# Patient Record
Sex: Female | Born: 1955
Health system: Southern US, Community
[De-identification: ages and names within clinical notes are randomized; demographics above are authoritative.]

## PROBLEM LIST (undated history)

## (undated) DIAGNOSIS — R911 Solitary pulmonary nodule: Secondary | ICD-10-CM

## (undated) DIAGNOSIS — Z860100 Personal history of colon polyps, unspecified: Secondary | ICD-10-CM

## (undated) DIAGNOSIS — E669 Obesity, unspecified: Secondary | ICD-10-CM

## (undated) DIAGNOSIS — G4700 Insomnia, unspecified: Secondary | ICD-10-CM

## (undated) DIAGNOSIS — J189 Pneumonia, unspecified organism: Secondary | ICD-10-CM

## (undated) DIAGNOSIS — J019 Acute sinusitis, unspecified: Secondary | ICD-10-CM

## (undated) DIAGNOSIS — I10 Essential (primary) hypertension: Secondary | ICD-10-CM

## (undated) DIAGNOSIS — M1990 Unspecified osteoarthritis, unspecified site: Secondary | ICD-10-CM

## (undated) DIAGNOSIS — K7460 Unspecified cirrhosis of liver: Secondary | ICD-10-CM

## (undated) DIAGNOSIS — E559 Vitamin D deficiency, unspecified: Secondary | ICD-10-CM

## (undated) DIAGNOSIS — E1169 Type 2 diabetes mellitus with other specified complication: Secondary | ICD-10-CM

## (undated) DIAGNOSIS — B001 Herpesviral vesicular dermatitis: Secondary | ICD-10-CM

## (undated) DIAGNOSIS — N760 Acute vaginitis: Secondary | ICD-10-CM

## (undated) DIAGNOSIS — Z8709 Personal history of other diseases of the respiratory system: Secondary | ICD-10-CM

## (undated) DIAGNOSIS — R945 Abnormal results of liver function studies: Secondary | ICD-10-CM

## (undated) HISTORY — DX: Obesity, unspecified: E66.9

## (undated) HISTORY — PX: KNEE ARTHROSCOPY: SHX127

## (undated) HISTORY — DX: Type 2 diabetes mellitus with other specified complication: E11.69

## (undated) HISTORY — PX: BREAST BIOPSY: SHX20

## (undated) HISTORY — DX: Abnormal results of liver function studies: R94.5

## (undated) HISTORY — PX: TONSILLECTOMY: SUR1361

## (undated) HISTORY — DX: Unspecified cirrhosis of liver: K74.60

## (undated) HISTORY — DX: Unspecified osteoarthritis, unspecified site: M19.90

## (undated) HISTORY — DX: Acute sinusitis, unspecified: J01.90

## (undated) HISTORY — DX: Insomnia, unspecified: G47.00

## (undated) HISTORY — DX: Acute vaginitis: N76.0

## (undated) HISTORY — PX: TUBAL LIGATION: SHX77

## (undated) HISTORY — DX: Herpesviral vesicular dermatitis: B00.1

## (undated) HISTORY — DX: Solitary pulmonary nodule: R91.1

## (undated) HISTORY — PX: COLONOSCOPY WITH ESOPHAGOGASTRODUODENOSCOPY (EGD): SHX5779

## (undated) HISTORY — DX: Vitamin D deficiency, unspecified: E55.9

---

## 1994-01-29 HISTORY — PX: ABDOMINAL HYSTERECTOMY: SHX81

## 1997-06-17 ENCOUNTER — Ambulatory Visit (HOSPITAL_BASED_OUTPATIENT_CLINIC_OR_DEPARTMENT_OTHER): Admission: RE | Admit: 1997-06-17 | Discharge: 1997-06-17 | Payer: Self-pay | Admitting: Orthopedic Surgery

## 2005-12-05 ENCOUNTER — Ambulatory Visit (HOSPITAL_BASED_OUTPATIENT_CLINIC_OR_DEPARTMENT_OTHER): Admission: RE | Admit: 2005-12-05 | Discharge: 2005-12-05 | Payer: Self-pay | Admitting: Orthopedic Surgery

## 2010-01-29 LAB — HM PAP SMEAR: HM Pap smear: NORMAL

## 2010-06-14 ENCOUNTER — Emergency Department (HOSPITAL_BASED_OUTPATIENT_CLINIC_OR_DEPARTMENT_OTHER)
Admission: EM | Admit: 2010-06-14 | Discharge: 2010-06-14 | Disposition: A | Discharge status: Home / Self Care | Payer: Self-pay | Attending: Emergency Medicine | Admitting: Emergency Medicine

## 2010-06-14 DIAGNOSIS — R112 Nausea with vomiting, unspecified: Secondary | ICD-10-CM | POA: Insufficient documentation

## 2010-06-14 DIAGNOSIS — I10 Essential (primary) hypertension: Secondary | ICD-10-CM | POA: Insufficient documentation

## 2010-06-14 LAB — BASIC METABOLIC PANEL
BUN: 14 mg/dL (ref 6–23)
CO2: 25 mEq/L (ref 19–32)
Chloride: 103 mEq/L (ref 96–112)
Glucose, Bld: 105 mg/dL — ABNORMAL HIGH (ref 70–99)
Potassium: 3.9 mEq/L (ref 3.5–5.1)

## 2012-01-28 ENCOUNTER — Emergency Department (HOSPITAL_BASED_OUTPATIENT_CLINIC_OR_DEPARTMENT_OTHER)
Admission: EM | Admit: 2012-01-28 | Discharge: 2012-01-28 | Disposition: A | Discharge status: Home / Self Care | Payer: No Typology Code available for payment source | Attending: Emergency Medicine | Admitting: Emergency Medicine

## 2012-01-28 ENCOUNTER — Encounter (HOSPITAL_BASED_OUTPATIENT_CLINIC_OR_DEPARTMENT_OTHER): Payer: Self-pay | Admitting: *Deleted

## 2012-01-28 DIAGNOSIS — R05 Cough: Secondary | ICD-10-CM | POA: Insufficient documentation

## 2012-01-28 DIAGNOSIS — I10 Essential (primary) hypertension: Secondary | ICD-10-CM | POA: Insufficient documentation

## 2012-01-28 DIAGNOSIS — R059 Cough, unspecified: Secondary | ICD-10-CM | POA: Insufficient documentation

## 2012-01-28 DIAGNOSIS — H6691 Otitis media, unspecified, right ear: Secondary | ICD-10-CM

## 2012-01-28 DIAGNOSIS — Z90710 Acquired absence of both cervix and uterus: Secondary | ICD-10-CM | POA: Insufficient documentation

## 2012-01-28 DIAGNOSIS — H6690 Otitis media, unspecified, unspecified ear: Secondary | ICD-10-CM | POA: Insufficient documentation

## 2012-01-28 DIAGNOSIS — Z79899 Other long term (current) drug therapy: Secondary | ICD-10-CM | POA: Insufficient documentation

## 2012-01-28 HISTORY — DX: Essential (primary) hypertension: I10

## 2012-01-28 MED ORDER — AZITHROMYCIN 250 MG PO TABS: ORAL_TABLET | ORAL | Status: DC

## 2012-01-28 MED ORDER — HYDROCODONE-ACETAMINOPHEN 5-325 MG PO TABS: 2.0000 | ORAL_TABLET | ORAL | Status: DC | PRN

## 2012-01-28 NOTE — ED Notes (Signed)
Cough started 12/8 [facility] placed on amoxicillin and tessalon was better for a couple of days then got worse feels like cough has gone into her chest

## 2012-01-28 NOTE — Discharge Instructions (Signed)

## 2012-01-28 NOTE — ED Provider Notes (Signed)
History     CSN: [id number]  Arrival date & time 01/28/12  1127   First MD Initiated Contact with Patient 01/28/12 1402      Chief Complaint  Patient presents with  . Cough    (Consider location/radiation/quality/duration/timing/severity/associated sxs/prior treatment) Patient is a 56 y.o. female presenting with cough. The history is provided by the patient. No language interpreter was used.  Cough This is a new problem. The current episode started more than 1 week ago. The problem occurs constantly. The problem has been gradually worsening. The cough is non-productive. Associated symptoms include ear pain. She has tried decongestants for the symptoms. She is not a smoker. Her past medical history does not include pneumonia.   Pt [facility] 12/8.   Pt reports she was better but cough returned and now she has an earache.  Pt complains of pain in right ear.   Past Medical History  Diagnosis Date  . Hypertension     Past Surgical History  Procedure Date  . Abdominal hysterectomy     History reviewed. No pertinent family history.  History  Substance Use Topics  . Smoking status: Never Smoker   . Smokeless tobacco: Not on file  . Alcohol Use: No    OB History    Grav Para Term Preterm Abortions TAB SAB Ect Mult Living                  Review of Systems  HENT: Positive for ear pain.   Respiratory: Positive for cough.     Allergies  Sulfa antibiotics  Home Medications   Current Outpatient Rx  Name  Route  Sig  Dispense  Refill  . HYDROCHLOROTHIAZIDE 50 MG PO TABS   Oral   Take 50 mg by mouth daily.           BP 164/88  Pulse 88  Temp 98.7 F (37.1 C) (Oral)  Resp 20  SpO2 99%  Physical Exam  Nursing note and vitals reviewed. Constitutional: She is oriented to person, place, and time. She appears well-developed and well-nourished.  HENT:  Head: Normocephalic and atraumatic.  Left Ear: External ear normal.  Nose:  Nose normal.  Mouth/Throat: Oropharynx is clear and moist.       Tm erythematous   Eyes: Conjunctivae normal are normal. Pupils are equal, round, and reactive to light.  Neck: Normal range of motion. Neck supple.  Cardiovascular: Normal rate and regular rhythm.   Pulmonary/Chest: Effort normal and breath sounds normal.  Abdominal: Soft. Bowel sounds are normal.  Musculoskeletal: Normal range of motion.  Neurological: She is alert and oriented to person, place, and time. She has normal reflexes.  Skin: Skin is warm.  Psychiatric: She has a normal mood and affect.    ED Course  Procedures (including critical care time)  Labs Reviewed - No data to display No results found.   No diagnosis found.    MDM  zithromax  And hydrocodone         Lonia Skinner [place], [place] 01/28/12 1434

## 2012-01-28 NOTE — ED Provider Notes (Signed)
Medical screening examination/treatment/procedure(s) were performed by non-physician practitioner and as supervising physician I was immediately available for consultation/collaboration.   Stephanie Cordova. Jaylene Arrowood, MD 01/28/12 1446

## 2012-06-24 ENCOUNTER — Other Ambulatory Visit (HOSPITAL_COMMUNITY): Payer: No Typology Code available for payment source

## 2012-07-02 ENCOUNTER — Inpatient Hospital Stay: Admit: 2012-07-02 | Payer: No Typology Code available for payment source | Admitting: Orthopedic Surgery

## 2012-07-02 SURGERY — ARTHROPLASTY, KNEE, TOTAL
Anesthesia: General | Laterality: Right

## 2012-11-25 ENCOUNTER — Ambulatory Visit (INDEPENDENT_AMBULATORY_CARE_PROVIDER_SITE_OTHER): Payer: No Typology Code available for payment source | Admitting: Family Medicine

## 2012-11-25 ENCOUNTER — Encounter: Payer: Self-pay | Admitting: Family Medicine

## 2012-11-25 ENCOUNTER — Telehealth: Payer: Self-pay | Admitting: Family Medicine

## 2012-11-25 VITALS — BP 130/90 | HR 88 | Temp 98.1°F | Resp 16 | Ht 61.75 in | Wt 272.1 lb

## 2012-11-25 DIAGNOSIS — Z860100 Personal history of colon polyps, unspecified: Secondary | ICD-10-CM

## 2012-11-25 DIAGNOSIS — Z0000 Encounter for general adult medical examination without abnormal findings: Secondary | ICD-10-CM

## 2012-11-25 DIAGNOSIS — Z780 Asymptomatic menopausal state: Secondary | ICD-10-CM

## 2012-11-25 DIAGNOSIS — G4700 Insomnia, unspecified: Secondary | ICD-10-CM

## 2012-11-25 DIAGNOSIS — M1990 Unspecified osteoarthritis, unspecified site: Secondary | ICD-10-CM | POA: Insufficient documentation

## 2012-11-25 DIAGNOSIS — T7840XA Allergy, unspecified, initial encounter: Secondary | ICD-10-CM | POA: Insufficient documentation

## 2012-11-25 DIAGNOSIS — N951 Menopausal and female climacteric states: Secondary | ICD-10-CM

## 2012-11-25 DIAGNOSIS — E669 Obesity, unspecified: Secondary | ICD-10-CM

## 2012-11-25 DIAGNOSIS — I10 Essential (primary) hypertension: Secondary | ICD-10-CM

## 2012-11-25 MED ORDER — ESTRADIOL 0.1 MG/24HR TD PTTW: 1.0000 | MEDICATED_PATCH | TRANSDERMAL | Status: DC

## 2012-11-25 NOTE — Telephone Encounter (Signed)
Lab order week of 12-09-2012 Check out comments: Labs prior to next visit. Lipid, renal, cbc, tsh, hepatic.

## 2012-11-25 NOTE — Patient Instructions (Addendum)
Belviq Salon Pas or Aspercreme topically for pain   Preventive Care for Adults, Female A healthy lifestyle and preventive care can promote health and wellness. Preventive health guidelines for women include the following key practices.  A routine yearly physical is a good way to check with your caregiver about your health and preventive screening. It is a chance to share any concerns and updates on your health, and to receive a thorough exam.  Visit your dentist for a routine exam and preventive care every 6 months. Brush your teeth twice a day and floss once a day. Good oral hygiene prevents tooth decay and gum disease.  The frequency of eye exams is based on your age, health, family medical history, use of contact lenses, and other factors. Follow your caregiver's recommendations for frequency of eye exams.  Eat a healthy diet. Foods like vegetables, fruits, whole grains, low-fat dairy products, and lean protein foods contain the nutrients you need without too many calories. Decrease your intake of foods high in solid fats, added sugars, and salt. Eat the right amount of calories for you.Get information about a proper diet from your caregiver, if necessary.  Regular physical exercise is one of the most important things you can do for your health. Most adults should get at least 150 minutes of moderate-intensity exercise (any activity that increases your heart rate and causes you to sweat) each week. In addition, most adults need muscle-strengthening exercises on 2 or more days a week.  Maintain a healthy weight. The body mass index (BMI) is a screening tool to identify possible weight problems. It provides an estimate of body fat based on height and weight. Your caregiver can help determine your BMI, and can help you achieve or maintain a healthy weight.For adults 20 years and older:  A BMI below 18.5 is considered underweight.  A BMI of 18.5 to 24.9 is normal.  A BMI of 25 to 29.9 is  considered overweight.  A BMI of 30 and above is considered obese.  Maintain normal blood lipids and cholesterol levels by exercising and minimizing your intake of saturated fat. Eat a balanced diet with plenty of fruit and vegetables. Blood tests for lipids and cholesterol should begin at age 37 and be repeated every 5 years. If your lipid or cholesterol levels are high, you are over 50, or you are at high risk for heart disease, you may need your cholesterol levels checked more frequently.Ongoing high lipid and cholesterol levels should be treated with medicines if diet and exercise are not effective.  If you smoke, find out from your caregiver how to quit. If you do not use tobacco, do not start.  If you are pregnant, do not drink alcohol. If you are breastfeeding, be very cautious about drinking alcohol. If you are not pregnant and choose to drink alcohol, do not exceed 1 drink per day. One drink is considered to be 12 ounces (355 mL) of beer, 5 ounces (148 mL) of wine, or 1.5 ounces (44 mL) of liquor.  Avoid use of street drugs. Do not share needles with anyone. Ask for help if you need support or instructions about stopping the use of drugs.  High blood pressure causes heart disease and increases the risk of stroke. Your blood pressure should be checked at least every 1 to 2 years. Ongoing high blood pressure should be treated with medicines if weight loss and exercise are not effective.  If you are 11 to 56 years old, ask your caregiver  if you should take aspirin to prevent strokes.  Diabetes screening involves taking a blood sample to check your fasting blood sugar level. This should be done once every 3 years, after age 91, if you are within normal weight and without risk factors for diabetes. Testing should be considered at a younger age or be carried out more frequently if you are overweight and have at least 1 risk factor for diabetes.  Breast cancer screening is essential preventive  care for women. You should practice "breast self-awareness." This means understanding the normal appearance and feel of your breasts and may include breast self-examination. Any changes detected, no matter how small, should be reported to a caregiver. Women in their 85s and 30s should have a clinical breast exam (CBE) by a caregiver as part of a regular health exam every 1 to 3 years. After age 59, women should have a CBE every year. Starting at age 12, women should consider having a mammography (breast X-ray test) every year. Women who have a family history of breast cancer should talk to their caregiver about genetic screening. Women at a high risk of breast cancer should talk to their caregivers about having magnetic resonance imaging (MRI) and a mammography every year.  The Pap test is a screening test for cervical cancer. A Pap test can show cell changes on the cervix that might become cervical cancer if left untreated. A Pap test is a procedure in which cells are obtained and examined from the lower end of the uterus (cervix).  Women should have a Pap test starting at age 35.  Between ages 50 and 18, Pap tests should be repeated every 2 years.  Beginning at age 31, you should have a Pap test every 3 years as long as the past 3 Pap tests have been normal.  Some women have medical problems that increase the chance of getting cervical cancer. Talk to your caregiver about these problems. It is especially important to talk to your caregiver if a new problem develops soon after your last Pap test. In these cases, your caregiver may recommend more frequent screening and Pap tests.  The above recommendations are the same for women who have or have not gotten the vaccine for human papillomavirus (HPV).  If you had a hysterectomy for a problem that was not cancer or a condition that could lead to cancer, then you no longer need Pap tests. Even if you no longer need a Pap test, a regular exam is a good idea  to make sure no other problems are starting.  If you are between ages 12 and 40, and you have had normal Pap tests going back 10 years, you no longer need Pap tests. Even if you no longer need a Pap test, a regular exam is a good idea to make sure no other problems are starting.  If you have had past treatment for cervical cancer or a condition that could lead to cancer, you need Pap tests and screening for cancer for at least 20 years after your treatment.  If Pap tests have been discontinued, risk factors (such as a new sexual partner) need to be reassessed to determine if screening should be resumed.  The HPV test is an additional test that may be used for cervical cancer screening. The HPV test looks for the virus that can cause the cell changes on the cervix. The cells collected during the Pap test can be tested for HPV. The HPV test could be used  to screen women aged 71 years and older, and should be used in women of any age who have unclear Pap test results. After the age of 43, women should have HPV testing at the same frequency as a Pap test.  Colorectal cancer can be detected and often prevented. Most routine colorectal cancer screening begins at the age of 70 and continues through age 75. However, your caregiver may recommend screening at an earlier age if you have risk factors for colon cancer. On a yearly basis, your caregiver may provide home test kits to check for hidden blood in the stool. Use of a small camera at the end of a tube, to directly examine the colon (sigmoidoscopy or colonoscopy), can detect the earliest forms of colorectal cancer. Talk to your caregiver about this at age 40, when routine screening begins. Direct examination of the colon should be repeated every 5 to 10 years through age 61, unless early forms of pre-cancerous polyps or small growths are found.  Hepatitis C blood testing is recommended for all people born from [birth year] through 1965 and any individual with known  risks for hepatitis C.  Practice safe sex. Use condoms and avoid high-risk sexual practices to reduce the spread of sexually transmitted infections (STIs). STIs include gonorrhea, chlamydia, syphilis, trichomonas, herpes, HPV, and human immunodeficiency virus (HIV). Herpes, HIV, and HPV are viral illnesses that have no cure. They can result in disability, cancer, and death. Sexually active women aged 95 and younger should be checked for chlamydia. Older women with new or multiple partners should also be tested for chlamydia. Testing for other STIs is recommended if you are sexually active and at increased risk.  Osteoporosis is a disease in which the bones lose minerals and strength with aging. This can result in serious bone fractures. The risk of osteoporosis can be identified using a bone density scan. Women ages 30 and over and women at risk for fractures or osteoporosis should discuss screening with their caregivers. Ask your caregiver whether you should take a calcium supplement or vitamin D to reduce the rate of osteoporosis.  Menopause can be associated with physical symptoms and risks. Hormone replacement therapy is available to decrease symptoms and risks. You should talk to your caregiver about whether hormone replacement therapy is right for you.  Use sunscreen with sun protection factor (SPF) of 30 or more. Apply sunscreen liberally and repeatedly throughout the day. You should seek shade when your shadow is shorter than you. Protect yourself by wearing long sleeves, pants, a wide-brimmed hat, and sunglasses year round, whenever you are outdoors.  Once a month, do a whole body skin exam, using a mirror to look at the skin on your back. Notify your caregiver of new moles, moles that have irregular borders, moles that are larger than a pencil eraser, or moles that have changed in shape or color.  Stay current with required immunizations.  Influenza. You need a dose every fall (or winter).  The composition of the flu vaccine changes each year, so being vaccinated once is not enough.  Pneumococcal polysaccharide. You need 1 to 2 doses if you smoke cigarettes or if you have certain chronic medical conditions. You need 1 dose at age 37 (or older) if you have never been vaccinated.  Tetanus, diphtheria, pertussis (Tdap, Td). Get 1 dose of Tdap vaccine if you are younger than age 39, are over 64 and have contact with an infant, are a Research scientist (physical sciences), are pregnant, or simply want to be  protected from whooping cough. After that, you need a Td booster dose every 10 years. Consult your caregiver if you have not had at least 3 tetanus and diphtheria-containing shots sometime in your life or have a deep or dirty wound.  HPV. You need this vaccine if you are a woman age 86 or younger. The vaccine is given in 3 doses over 6 months.  Measles, mumps, rubella (MMR). You need at least 1 dose of MMR if you were born in 1957 or later. You may also need a second dose.  Meningococcal. If you are age 28 to 95 and a first-year college student living in a residence hall, or have one of several medical conditions, you need to get vaccinated against meningococcal disease. You may also need additional booster doses.  Zoster (shingles). If you are age 63 or older, you should get this vaccine.  Varicella (chickenpox). If you have never had chickenpox or you were vaccinated but received only 1 dose, talk to your caregiver to find out if you need this vaccine.  Hepatitis A. You need this vaccine if you have a specific risk factor for hepatitis A virus infection or you simply wish to be protected from this disease. The vaccine is usually given as 2 doses, 6 to 18 months apart.  Hepatitis B. You need this vaccine if you have a specific risk factor for hepatitis B virus infection or you simply wish to be protected from this disease. The vaccine is given in 3 doses, usually over 6 months. Preventive Services /  Frequency Ages 40 to 46  Blood pressure check.** / Every 1 to 2 years.  Lipid and cholesterol check.** / Every 5 years beginning at age 49.  Clinical breast exam.** / Every 3 years for women in their 23s and 30s.  Pap test.** / Every 2 years from ages 22 through 49. Every 3 years starting at age 43 through age 38 or 26 with a history of 3 consecutive normal Pap tests.  HPV screening.** / Every 3 years from ages 6 through ages 34 to 20 with a history of 3 consecutive normal Pap tests.  Hepatitis C blood test.** / For any individual with known risks for hepatitis C.  Skin self-exam. / Monthly.  Influenza immunization.** / Every year.  Pneumococcal polysaccharide immunization.** / 1 to 2 doses if you smoke cigarettes or if you have certain chronic medical conditions.  Tetanus, diphtheria, pertussis (Tdap, Td) immunization. / A one-time dose of Tdap vaccine. After that, you need a Td booster dose every 10 years.  HPV immunization. / 3 doses over 6 months, if you are 89 and younger.  Measles, mumps, rubella (MMR) immunization. / You need at least 1 dose of MMR if you were born in 1957 or later. You may also need a second dose.  Meningococcal immunization. / 1 dose if you are age 67 to 104 and a first-year college student living in a residence hall, or have one of several medical conditions, you need to get vaccinated against meningococcal disease. You may also need additional booster doses.  Varicella immunization.** / Consult your caregiver.  Hepatitis A immunization.** / Consult your caregiver. 2 doses, 6 to 18 months apart.  Hepatitis B immunization.** / Consult your caregiver. 3 doses usually over 6 months. Ages 72 to 5  Blood pressure check.** / Every 1 to 2 years.  Lipid and cholesterol check.** / Every 5 years beginning at age 33.  Clinical breast exam.** / Every year after age  40.  Mammogram.** / Every year beginning at age 22 and continuing for as long as you are in  good health. Consult with your caregiver.  Pap test.** / Every 3 years starting at age 36 through age 83 or 65 with a history of 3 consecutive normal Pap tests.  HPV screening.** / Every 3 years from ages 1 through ages 41 to 31 with a history of 3 consecutive normal Pap tests.  Fecal occult blood test (FOBT) of stool. / Every year beginning at age 50 and continuing until age 51. You may not need to do this test if you get a colonoscopy every 10 years.  Flexible sigmoidoscopy or colonoscopy.** / Every 5 years for a flexible sigmoidoscopy or every 10 years for a colonoscopy beginning at age 54 and continuing until age 92.  Hepatitis C blood test.** / For all people born from [birth year] through 1965 and any individual with known risks for hepatitis C.  Skin self-exam. / Monthly.  Influenza immunization.** / Every year.  Pneumococcal polysaccharide immunization.** / 1 to 2 doses if you smoke cigarettes or if you have certain chronic medical conditions.  Tetanus, diphtheria, pertussis (Tdap, Td) immunization.** / A one-time dose of Tdap vaccine. After that, you need a Td booster dose every 10 years.  Measles, mumps, rubella (MMR) immunization. / You need at least 1 dose of MMR if you were born in 1957 or later. You may also need a second dose.  Varicella immunization.** / Consult your caregiver.  Meningococcal immunization.** / Consult your caregiver.  Hepatitis A immunization.** / Consult your caregiver. 2 doses, 6 to 18 months apart.  Hepatitis B immunization.** / Consult your caregiver. 3 doses, usually over 6 months. Ages 7 and over  Blood pressure check.** / Every 1 to 2 years.  Lipid and cholesterol check.** / Every 5 years beginning at age 18.  Clinical breast exam.** / Every year after age 67.  Mammogram.** / Every year beginning at age 33 and continuing for as long as you are in good health. Consult with your caregiver.  Pap test.** / Every 3 years starting at age 28 through  age 65 or 74 with a 3 consecutive normal Pap tests. Testing can be stopped between 65 and 70 with 3 consecutive normal Pap tests and no abnormal Pap or HPV tests in the past 10 years.  HPV screening.** / Every 3 years from ages 21 through ages 61 or 69 with a history of 3 consecutive normal Pap tests. Testing can be stopped between 65 and 70 with 3 consecutive normal Pap tests and no abnormal Pap or HPV tests in the past 10 years.  Fecal occult blood test (FOBT) of stool. / Every year beginning at age 63 and continuing until age 51. You may not need to do this test if you get a colonoscopy every 10 years.  Flexible sigmoidoscopy or colonoscopy.** / Every 5 years for a flexible sigmoidoscopy or every 10 years for a colonoscopy beginning at age 10 and continuing until age 23.  Hepatitis C blood test.** / For all people born from [birth year] through 1964 and any individual with known risks for hepatitis C.  Osteoporosis screening.** / A one-time screening for women ages 69 and over and women at risk for fractures or osteoporosis.  Skin self-exam. / Monthly.  Influenza immunization.** / Every year.  Pneumococcal polysaccharide immunization.** / 1 dose at age 37 (or older) if you have never been vaccinated.  Tetanus, diphtheria, pertussis (Tdap, Td) immunization. /  A one-time dose of Tdap vaccine if you are over 65 and have contact with an infant, are a Research scientist (physical sciences), or simply want to be protected from whooping cough. After that, you need a Td booster dose every 10 years.  Varicella immunization.** / Consult your caregiver.  Meningococcal immunization.** / Consult your caregiver.  Hepatitis A immunization.** / Consult your caregiver. 2 doses, 6 to 18 months apart.  Hepatitis B immunization.** / Check with your caregiver. 3 doses, usually over 6 months. ** Family history and personal history of risk and conditions may change your caregiver's recommendations. Document Released: 03/13/2001 Document  Revised: 04/09/2011 Document Reviewed: 06/12/2010 [facility] Patient Information 2014 [place], [place].

## 2012-11-27 NOTE — Telephone Encounter (Signed)
Lab order placed.

## 2012-11-30 ENCOUNTER — Encounter: Payer: Self-pay | Admitting: Family Medicine

## 2012-11-30 DIAGNOSIS — Z0000 Encounter for general adult medical examination without abnormal findings: Secondary | ICD-10-CM | POA: Insufficient documentation

## 2012-11-30 DIAGNOSIS — I10 Essential (primary) hypertension: Secondary | ICD-10-CM | POA: Insufficient documentation

## 2012-11-30 DIAGNOSIS — Z860100 Personal history of colon polyps, unspecified: Secondary | ICD-10-CM | POA: Insufficient documentation

## 2012-11-30 DIAGNOSIS — G4700 Insomnia, unspecified: Secondary | ICD-10-CM | POA: Insufficient documentation

## 2012-11-30 HISTORY — DX: Insomnia, unspecified: G47.00

## 2012-11-30 NOTE — Assessment & Plan Note (Signed)
Declines flu shot, request old records. Encouraged fasting labs prior to next visit. Encouraged DASH diet.

## 2012-11-30 NOTE — Assessment & Plan Note (Signed)
Will try Restoril 15 mg has failed other meds in past.

## 2012-11-30 NOTE — Assessment & Plan Note (Signed)
Controlled on Estradiol

## 2012-11-30 NOTE — Progress Notes (Signed)
Patient ID: Stephanie Cordova, female   DOB: 1955/08/16, 57 y.o.   MRN: [id number] Stephanie Cordova [id number] 1955/12/11 11/30/2012      Progress Note-Follow Up  Subjective  Chief Complaint  Chief Complaint  Patient presents with  . Establish Care    Pt new to estabish care. Last colonoscopy 2007 and is scheduled for repeat tomorrow. Thinks she is up to date with tetanus and pneumovax. Declines flu shot today. Thinks last mammogram was 2 yrs ago.  . Weight Gain    Pt wants to discuss weight management.    HPI  Patient 57 year old for work in today to establish care and she is generally in good health but does have a few concerns. She has been treating her hot flashes estradiol insulin and not to run out of this works fairly well. Uses Restoril for sleep is reasonable results. Has been having occasional palpitations with exertion and some shortness of breath as well. Also only with exertion. No chest pain, diaphoresis or nausea. No recent illness. Does struggle with arthritis in her knees. Has stiffness and pain frequently. No GI or GU complaints at this time. Has had blood in her stool in the past but follows with [facility] gastroenterology and has had no recent episodes.  Past Medical History  Diagnosis Date  . Hypertension   . Personal history of colonic polyps   . Insomnia   . Obesity   . Hot flash, menopausal   . Arthritis     b/l knees, bone on bone  . Allergy     seaonal and greens  . Preventative health care 11/30/2012  . Personal history of colonic polyps 11/30/2012    Sees Dr Randa Evens at [place]  . Insomnia 11/30/2012  . HTN (hypertension) 11/30/2012    Past Surgical History  Procedure Laterality Date  . Abdominal hysterectomy  1996  . Tonsillectomy  1968  . Knee arthroscopy Right 1996 and 2007    Family History  Problem Relation Age of Onset  . Hypertension Mother   . Hyperlipidemia Mother   . Leukemia Mother   . Cancer Mother   . Hypertension Father   . Hyperlipidemia Father    . Diabetes Father   . Heart disease Father   . Alzheimer's disease Father   . Multiple sclerosis Son   . Heart disease Maternal Grandfather     History   Social History  . Marital Status: Widowed    Spouse Name: N/A    Number of Children: N/A  . Years of Education: N/A   Occupational History  . Not on file.   Social History Main Topics  . Smoking status: Never Smoker   . Smokeless tobacco: Never Used  . Alcohol Use: No  . Drug Use: No  . Sexual Activity: No     Comment: lives alone, widowed in 2006   Other Topics Concern  . Not on file   Social History Narrative  . No narrative on file    Current Outpatient Prescriptions on File Prior to Visit  Medication Sig Dispense Refill  . hydrochlorothiazide (HYDRODIURIL) 50 MG tablet Take 50 mg by mouth daily.       No current facility-administered medications on file prior to visit.    Allergies  Allergen Reactions  . Sulfa Antibiotics     Review of Systems  Review of Systems  Constitutional: Negative for fever, chills and malaise/fatigue.  HENT: Negative for congestion, hearing loss and nosebleeds.   Eyes: Negative for discharge.  Respiratory: Negative for cough, sputum production, shortness of breath and wheezing.   Cardiovascular: Negative for chest pain, palpitations and leg swelling.  Gastrointestinal: Negative for heartburn, nausea, vomiting, abdominal pain, diarrhea, constipation and blood in stool.  Genitourinary: Negative for dysuria, urgency, frequency and hematuria.  Musculoskeletal: Negative for back pain, falls and myalgias.  Skin: Negative for rash.  Neurological: Negative for dizziness, tremors, sensory change, focal weakness, loss of consciousness, weakness and headaches.  Endo/Heme/Allergies: Negative for polydipsia. Does not bruise/bleed easily.  Psychiatric/Behavioral: Negative for depression and suicidal ideas. The patient is not nervous/anxious and does not have insomnia.     Objective  BP  130/90  Pulse 88  Temp(Src) 98.1 F (36.7 C) (Oral)  Resp 16  Ht 5' 1.75" (1.568 m)  Wt 272 lb 1.3 oz (123.415 kg)  BMI 50.20 kg/m2  SpO2 99%  LMP 01/29/1994  Physical Exam  Physical Exam  Constitutional: She is oriented to person, place, and time and well-developed, well-nourished, and in no distress. No distress.  HENT:  Head: Normocephalic and atraumatic.  Right Ear: External ear normal.  Left Ear: External ear normal.  Nose: Nose normal.  Mouth/Throat: Oropharynx is clear and moist. No oropharyngeal exudate.  Eyes: Conjunctivae are normal. Pupils are equal, round, and reactive to light. Right eye exhibits no discharge. Left eye exhibits no discharge. No scleral icterus.  Neck: Normal range of motion. Neck supple. No thyromegaly present.  Cardiovascular: Normal rate, regular rhythm, normal heart sounds and intact distal pulses.   No murmur heard. Pulmonary/Chest: Effort normal and breath sounds normal. No respiratory distress. She has no wheezes. She has no rales.  Abdominal: Soft. Bowel sounds are normal. She exhibits no distension and no mass. There is no tenderness.  Musculoskeletal: Normal range of motion. She exhibits no edema and no tenderness.  Lymphadenopathy:    She has no cervical adenopathy.  Neurological: She is alert and oriented to person, place, and time. She has normal reflexes. No cranial nerve deficit. Coordination normal.  Skin: Skin is warm and dry. No rash noted. She is not diaphoretic.  Psychiatric: Mood, memory and affect normal.     Lab Results  Component Value Date   CREATININE 0.60 06/14/2010   BUN 14 06/14/2010   NA 138 06/14/2010   K 3.9 06/14/2010   CL 103 06/14/2010   CO2 25 06/14/2010     Assessment & Plan  Preventative health care Declines flu shot, request old records. Encouraged fasting labs prior to next visit. Encouraged DASH diet.  HTN (hypertension) Well controlled on current meds. No changes.   Obesity Encouraged DASH diet  and increased exercise as tolerated  Insomnia Will try Restoril 15 mg has failed other meds in past.   Hot flash, menopausal Controlled on Estradiol

## 2012-11-30 NOTE — Assessment & Plan Note (Signed)
Encouraged DASH diet and increased exercise as tolerated

## 2012-11-30 NOTE — Assessment & Plan Note (Signed)
Well controlled on current meds. No changes

## 2012-12-03 LAB — LIPID PANEL
HDL: 55 mg/dL (ref >39)
LDL Cholesterol: 81 mg/dL (ref 0–99)
Triglycerides: 71 mg/dL (ref <150)
VLDL: 14 mg/dL (ref 0–40)

## 2012-12-03 LAB — HEPATIC FUNCTION PANEL
ALT: 49 U/L — ABNORMAL HIGH (ref 0–35)
Albumin: 3.9 g/dL (ref 3.5–5.2)
Bilirubin, Direct: 0.2 mg/dL (ref 0.0–0.3)

## 2012-12-03 LAB — CBC
MCHC: 34.2 g/dL (ref 30.0–36.0)
MCV: 89 fL (ref 78.0–100.0)
Platelets: 173 10*3/uL (ref 150–400)
RDW: 13.4 % (ref 11.5–15.5)
WBC: 6.5 10*3/uL (ref 4.0–10.5)

## 2012-12-03 LAB — RENAL FUNCTION PANEL
Albumin: 3.9 g/dL (ref 3.5–5.2)
Creat: 0.7 mg/dL (ref 0.50–1.10)
Phosphorus: 2.6 mg/dL (ref 2.3–4.6)

## 2012-12-16 ENCOUNTER — Ambulatory Visit (INDEPENDENT_AMBULATORY_CARE_PROVIDER_SITE_OTHER): Payer: No Typology Code available for payment source | Admitting: Family Medicine

## 2012-12-16 ENCOUNTER — Encounter: Payer: Self-pay | Admitting: Family Medicine

## 2012-12-16 VITALS — BP 152/108 | HR 63 | Temp 98.1°F | Ht 61.75 in | Wt 271.1 lb

## 2012-12-16 DIAGNOSIS — E669 Obesity, unspecified: Secondary | ICD-10-CM

## 2012-12-16 DIAGNOSIS — K760 Fatty (change of) liver, not elsewhere classified: Secondary | ICD-10-CM

## 2012-12-16 DIAGNOSIS — G4700 Insomnia, unspecified: Secondary | ICD-10-CM

## 2012-12-16 DIAGNOSIS — R739 Hyperglycemia, unspecified: Secondary | ICD-10-CM

## 2012-12-16 DIAGNOSIS — I10 Essential (primary) hypertension: Secondary | ICD-10-CM

## 2012-12-16 DIAGNOSIS — R7309 Other abnormal glucose: Secondary | ICD-10-CM

## 2012-12-16 DIAGNOSIS — K7689 Other specified diseases of liver: Secondary | ICD-10-CM

## 2012-12-16 DIAGNOSIS — E119 Type 2 diabetes mellitus without complications: Secondary | ICD-10-CM

## 2012-12-16 MED ORDER — LOSARTAN POTASSIUM 50 MG PO TABS: 50.0000 mg | ORAL_TABLET | Freq: Every day | ORAL | Status: DC

## 2012-12-16 MED ORDER — LORCASERIN HCL 10 MG PO TABS: 10.0000 mg | ORAL_TABLET | Freq: Two times a day (BID) | ORAL | Status: DC

## 2012-12-16 NOTE — Progress Notes (Signed)
Pre visit review using our clinic review tool, if applicable. No additional management support is needed unless otherwise documented below in the visit note. 

## 2012-12-21 ENCOUNTER — Encounter: Payer: Self-pay | Admitting: Family Medicine

## 2012-12-21 DIAGNOSIS — E1169 Type 2 diabetes mellitus with other specified complication: Secondary | ICD-10-CM

## 2012-12-21 DIAGNOSIS — E669 Obesity, unspecified: Secondary | ICD-10-CM

## 2012-12-21 DIAGNOSIS — K7460 Unspecified cirrhosis of liver: Secondary | ICD-10-CM | POA: Insufficient documentation

## 2012-12-21 HISTORY — DX: Type 2 diabetes mellitus with other specified complication: E11.69

## 2012-12-21 HISTORY — DX: Unspecified cirrhosis of liver: K74.60

## 2012-12-21 HISTORY — DX: Type 2 diabetes mellitus with other specified complication: E66.9

## 2012-12-21 NOTE — Assessment & Plan Note (Signed)
Patient interested in trying Belviq, encouraged DASH diet and increased exercise

## 2012-12-21 NOTE — Progress Notes (Signed)
Patient ID: Stephanie Cordova, female   DOB: 1956/01/06, 57 y.o.   MRN: [id number] Stephanie Cordova [id number] May 15, 1955 12/21/2012      Progress Note-Follow Up  Subjective  Chief Complaint  Chief Complaint  Patient presents with  . Follow-up    3 month    HPI  Patient is a 57 year old female who is in today for followup. Patient is feeling well. No recent illness. No chest pain, palpitation, fevers, headache, shortness of breath, GI or GU complaints was taking medications as prescribed been trying to exercise and maintain a low carb diet has even tried Weight Watchers and is trying to eat well  Past Medical History  Diagnosis Date  . Hypertension   . Personal history of colonic polyps   . Insomnia   . Obesity   . Hot flash, menopausal   . Arthritis     b/l knees, bone on bone  . Allergy     seaonal and greens  . Preventative health care 11/30/2012  . Personal history of colonic polyps 11/30/2012    Sees Dr Randa Evens at [place]  . Insomnia 11/30/2012  . HTN (hypertension) 11/30/2012  . Hyperglycemia 12/21/2012  . Fatty infiltration of liver 12/21/2012    Past Surgical History  Procedure Laterality Date  . Abdominal hysterectomy  1996  . Tonsillectomy  1968  . Knee arthroscopy Right 1996 and 2007    Family History  Problem Relation Age of Onset  . Hypertension Mother   . Hyperlipidemia Mother   . Leukemia Mother   . Cancer Mother   . Hypertension Father   . Hyperlipidemia Father   . Diabetes Father   . Heart disease Father   . Alzheimer's disease Father   . Multiple sclerosis Son   . Heart disease Maternal Grandfather     History   Social History  . Marital Status: Widowed    Spouse Name: N/A    Number of Children: N/A  . Years of Education: N/A   Occupational History  . Not on file.   Social History Main Topics  . Smoking status: Never Smoker   . Smokeless tobacco: Never Used  . Alcohol Use: No  . Drug Use: No  . Sexual Activity: No     Comment: lives alone,  widowed in 2006   Other Topics Concern  . Not on file   Social History Narrative  . No narrative on file    Current Outpatient Prescriptions on File Prior to Visit  Medication Sig Dispense Refill  . estradiol (VIVELLE-DOT) 0.1 MG/24HR patch Place 1 patch (0.1 mg total) onto the skin once a week.  4 patch  5  . metoprolol (LOPRESSOR) 100 MG tablet Take 100 mg by mouth 2 (two) times daily.      . temazepam (RESTORIL) 15 MG capsule Take 15 mg by mouth at bedtime as needed for sleep.       No current facility-administered medications on file prior to visit.    Allergies  Allergen Reactions  . Sulfa Antibiotics     Review of Systems  Review of Systems  Constitutional: Negative for fever, chills and malaise/fatigue.  HENT: Negative for congestion, hearing loss and nosebleeds.   Eyes: Negative for discharge.  Respiratory: Negative for cough, sputum production, shortness of breath and wheezing.   Cardiovascular: Negative for chest pain, palpitations and leg swelling.  Gastrointestinal: Negative for heartburn, nausea, vomiting, abdominal pain, diarrhea, constipation and blood in stool.  Genitourinary: Negative for dysuria, urgency, frequency and  hematuria.  Musculoskeletal: Negative for back pain, falls and myalgias.  Skin: Negative for rash.  Neurological: Negative for dizziness, tremors, sensory change, focal weakness, loss of consciousness, weakness and headaches.  Endo/Heme/Allergies: Negative for polydipsia. Does not bruise/bleed easily.  Psychiatric/Behavioral: Negative for depression and suicidal ideas. The patient is not nervous/anxious and does not have insomnia.     Objective  BP 152/108  Pulse 63  Temp(Src) 98.1 F (36.7 C) (Oral)  Ht 5' 1.75" (1.568 m)  Wt 271 lb 1.3 oz (122.961 kg)  BMI 50.01 kg/m2  SpO2 97%  LMP 01/29/1994  Physical Exam  Physical Exam  Constitutional: She is oriented to person, place, and time and well-developed, well-nourished, and in no  distress. No distress.  HENT:  Head: Normocephalic and atraumatic.  Eyes: Conjunctivae are normal.  Neck: Neck supple. No thyromegaly present.  Cardiovascular: Normal rate, regular rhythm and normal heart sounds.   No murmur heard. Pulmonary/Chest: Effort normal and breath sounds normal. She has no wheezes.  Abdominal: She exhibits no distension and no mass.  Musculoskeletal: She exhibits no edema.  Lymphadenopathy:    She has no cervical adenopathy.  Neurological: She is alert and oriented to person, place, and time.  Skin: Skin is warm and dry. No rash noted. She is not diaphoretic.  Psychiatric: Memory, affect and judgment normal.    Lab Results  Component Value Date   TSH 1.284 12/03/2012   Lab Results  Component Value Date   WBC 6.5 12/03/2012   HGB 13.8 12/03/2012   HCT 40.4 12/03/2012   MCV 89.0 12/03/2012   PLT 173 12/03/2012   Lab Results  Component Value Date   CREATININE 0.70 12/03/2012   BUN 14 12/03/2012   NA 139 12/03/2012   K 4.3 12/03/2012   CL 105 12/03/2012   CO2 25 12/03/2012   Lab Results  Component Value Date   ALT 49* 12/03/2012   AST 44* 12/03/2012   ALKPHOS 61 12/03/2012   BILITOT 0.8 12/03/2012   Lab Results  Component Value Date   CHOL 150 12/03/2012   Lab Results  Component Value Date   HDL 55 12/03/2012   Lab Results  Component Value Date   LDLCALC 81 12/03/2012   Lab Results  Component Value Date   TRIG 71 12/03/2012   Lab Results  Component Value Date   CHOLHDL 2.7 12/03/2012     Assessment & Plan  HTN (hypertension) Continue Metoprolol and increase Losartan to 50 mg daily. Encouraged the DASH diet.   Hyperglycemia hgba1c 6.2 per patient when last checked a year ago. Avoid simple carbs and agrees to recheck  Obesity Patient interested in trying Belviq, encouraged DASH diet and increased exercise  Insomnia Tolerating Restoril prn

## 2012-12-21 NOTE — Assessment & Plan Note (Signed)
Tolerating Restoril prn

## 2012-12-21 NOTE — Assessment & Plan Note (Signed)
Continue Metoprolol and increase Losartan to 50 mg daily. Encouraged the DASH diet.

## 2012-12-21 NOTE — Assessment & Plan Note (Signed)
hgba1c 6.2 per patient when last checked a year ago. Avoid simple carbs and agrees to recheck

## 2013-01-16 ENCOUNTER — Telehealth: Payer: Self-pay | Admitting: Family Medicine

## 2013-01-16 NOTE — Telephone Encounter (Signed)
Received medical records from [facility]

## 2013-02-05 ENCOUNTER — Ambulatory Visit: Payer: No Typology Code available for payment source | Admitting: Family Medicine

## 2013-05-07 ENCOUNTER — Other Ambulatory Visit: Payer: Self-pay

## 2013-07-18 ENCOUNTER — Other Ambulatory Visit: Payer: Self-pay | Admitting: Family Medicine

## 2013-07-20 ENCOUNTER — Telehealth: Payer: Self-pay | Admitting: *Deleted

## 2013-07-20 NOTE — Telephone Encounter (Signed)
New patient 10.28.14, px Estradiol [Vivelle-DOT] patch Once Weekly #4 x 5 refills; new request from pharmacy for same as previous medication and same dosing instructions; sent to pharmacy for #4 x 0 refills, as patient has not been seen since 11.18.14. Pharmacy called stating that Estradiol Vivelle is a Twice Weekly medication and that pt was previously px Estradiol Climara, which is a Once Weekly patch/SLS Please Advise on refills, as to which Estradiol patch you would like px for patient/SLS

## 2013-07-20 NOTE — Telephone Encounter (Signed)
Refill sent per [facility] refill protocol/SLS  

## 2013-07-20 NOTE — Telephone Encounter (Signed)
She should be prescribed what she has used previously so the Climara is fine, disp #4 with 2 rf til seen

## 2013-07-21 MED ORDER — ESTRADIOL 0.1 MG/24HR TD PTWK
0.1000 mg | MEDICATED_PATCH | TRANSDERMAL | Status: DC
Start: 2013-07-21 — End: 2014-06-01

## 2014-03-23 ENCOUNTER — Telehealth: Payer: Self-pay | Admitting: Family Medicine

## 2014-03-23 NOTE — Telephone Encounter (Signed)
Yes I feel comfortable seeing her prior to her annual, 15 minutes is fine as long as she understands it is an acute visit

## 2014-03-23 NOTE — Telephone Encounter (Signed)
Caller name: Adrienne MochaMary Cordova Relationship to patient:self Can be [phone]   Reason for call: PT has not been seen since 11/2012-  States having trouble with sleeping. PT wants to schedule an acute prior to her CPE- has new insurance. Will you be able to see her for acute before CPE due to is being 1.5 years since last visit? If so would you like a 15 minute or 30 minute appointment for the acute.

## 2014-05-18 ENCOUNTER — Ambulatory Visit: Payer: No Typology Code available for payment source | Admitting: Family Medicine

## 2014-06-01 ENCOUNTER — Encounter: Payer: Self-pay | Admitting: Family Medicine

## 2014-06-01 ENCOUNTER — Ambulatory Visit (INDEPENDENT_AMBULATORY_CARE_PROVIDER_SITE_OTHER): Payer: BLUE CROSS/BLUE SHIELD | Admitting: Family Medicine

## 2014-06-01 VITALS — Ht 63.0 in | Wt 249.0 lb

## 2014-06-01 DIAGNOSIS — R739 Hyperglycemia, unspecified: Secondary | ICD-10-CM | POA: Diagnosis not present

## 2014-06-01 DIAGNOSIS — E119 Type 2 diabetes mellitus without complications: Secondary | ICD-10-CM

## 2014-06-01 DIAGNOSIS — R51 Headache: Secondary | ICD-10-CM

## 2014-06-01 DIAGNOSIS — E1169 Type 2 diabetes mellitus with other specified complication: Secondary | ICD-10-CM

## 2014-06-01 DIAGNOSIS — E669 Obesity, unspecified: Secondary | ICD-10-CM | POA: Diagnosis not present

## 2014-06-01 DIAGNOSIS — E782 Mixed hyperlipidemia: Secondary | ICD-10-CM

## 2014-06-01 DIAGNOSIS — I10 Essential (primary) hypertension: Secondary | ICD-10-CM

## 2014-06-01 DIAGNOSIS — G479 Sleep disorder, unspecified: Secondary | ICD-10-CM

## 2014-06-01 DIAGNOSIS — R519 Headache, unspecified: Secondary | ICD-10-CM

## 2014-06-01 MED ORDER — TRIAMTERENE-HCTZ 37.5-25 MG PO TABS: 1.0000 | ORAL_TABLET | Freq: Every day | ORAL | Status: DC

## 2014-06-01 MED ORDER — METOPROLOL TARTRATE 100 MG PO TABS: 100.0000 mg | ORAL_TABLET | Freq: Two times a day (BID) | ORAL | Status: DC

## 2014-06-01 MED ORDER — LISINOPRIL 5 MG PO TABS: 5.0000 mg | ORAL_TABLET | Freq: Every day | ORAL | Status: DC

## 2014-06-01 NOTE — Patient Instructions (Signed)
DASH Eating Plan °DASH stands for "Dietary Approaches to Stop Hypertension." The DASH eating plan is a healthy eating plan that has been shown to reduce high blood pressure (hypertension). Additional health benefits may include reducing the risk of type 2 diabetes mellitus, heart disease, and stroke. The DASH eating plan may also help with weight loss. °WHAT DO I NEED TO KNOW ABOUT THE DASH EATING PLAN? °For the DASH eating plan, you will follow these general guidelines: °· Choose foods with a percent daily value for sodium of less than 5% (as listed on the food label). °· Use salt-free seasonings or herbs instead of table salt or sea salt. °· Check with your health care provider or pharmacist before using salt substitutes. °· Eat lower-sodium products, often labeled as "lower sodium" or "no salt added." °· Eat fresh foods. °· Eat more vegetables, fruits, and low-fat dairy products. °· Choose whole grains. Look for the word "whole" as the first word in the ingredient list. °· Choose fish and skinless chicken or turkey more often than red meat. Limit fish, poultry, and meat to 6 oz (170 g) each day. °· Limit sweets, desserts, sugars, and sugary drinks. °· Choose heart-healthy fats. °· Limit cheese to 1 oz (28 g) per day. °· Eat more home-cooked food and less restaurant, buffet, and fast food. °· Limit fried foods. °· Cook foods using methods other than frying. °· Limit canned vegetables. If you do use them, rinse them well to decrease the sodium. °· When eating at a restaurant, ask that your food be prepared with less salt, or no salt if possible. °WHAT FOODS CAN I EAT? °Seek help from a dietitian for individual calorie needs. °Grains °Whole grain or whole wheat bread. Brown rice. Whole grain or whole wheat pasta. Quinoa, bulgur, and whole grain cereals. Low-sodium cereals. Corn or whole wheat flour tortillas. Whole grain cornbread. Whole grain crackers. Low-sodium crackers. °Vegetables °Fresh or frozen vegetables  (raw, steamed, roasted, or grilled). Low-sodium or reduced-sodium tomato and vegetable juices. Low-sodium or reduced-sodium tomato sauce and paste. Low-sodium or reduced-sodium canned vegetables.  °Fruits °All fresh, canned (in natural juice), or frozen fruits. °Meat and Other Protein Products °Ground beef (85% or leaner), grass-fed beef, or beef trimmed of fat. Skinless chicken or turkey. Ground chicken or turkey. Pork trimmed of fat. All fish and seafood. Eggs. Dried beans, peas, or lentils. Unsalted nuts and seeds. Unsalted canned beans. °Dairy °Low-fat dairy products, such as skim or 1% milk, 2% or reduced-fat cheeses, low-fat ricotta or cottage cheese, or plain low-fat yogurt. Low-sodium or reduced-sodium cheeses. °Fats and Oils °Tub margarines without trans fats. Light or reduced-fat mayonnaise and salad dressings (reduced sodium). Avocado. Safflower, olive, or canola oils. Natural peanut or almond butter. °Other °Unsalted popcorn and pretzels. °The items listed above may not be a complete list of recommended foods or beverages. Contact your dietitian for more options. °WHAT FOODS ARE NOT RECOMMENDED? °Grains °White bread. White pasta. White rice. Refined cornbread. Bagels and croissants. Crackers that contain trans fat. °Vegetables °Creamed or fried vegetables. Vegetables in a cheese sauce. Regular canned vegetables. Regular canned tomato sauce and paste. Regular tomato and vegetable juices. °Fruits °Dried fruits. Canned fruit in light or heavy syrup. Fruit juice. °Meat and Other Protein Products °Fatty cuts of meat. Ribs, chicken wings, bacon, sausage, bologna, salami, chitterlings, fatback, hot dogs, bratwurst, and packaged luncheon meats. Salted nuts and seeds. Canned beans with salt. °Dairy °Whole or 2% milk, cream, half-and-half, and cream cheese. Whole-fat or sweetened yogurt. Full-fat   cheeses or blue cheese. Nondairy creamers and whipped toppings. Processed cheese, cheese spreads, or cheese  curds. °Condiments °Onion and garlic salt, seasoned salt, table salt, and sea salt. Canned and packaged gravies. Worcestershire sauce. Tartar sauce. Barbecue sauce. Teriyaki sauce. Soy sauce, including reduced sodium. Steak sauce. Fish sauce. Oyster sauce. Cocktail sauce. Horseradish. Ketchup and mustard. Meat flavorings and tenderizers. Bouillon cubes. Hot sauce. Tabasco sauce. Marinades. Taco seasonings. Relishes. °Fats and Oils °Butter, stick margarine, lard, shortening, ghee, and bacon fat. Coconut, palm kernel, or palm oils. Regular salad dressings. °Other °Pickles and olives. Salted popcorn and pretzels. °The items listed above may not be a complete list of foods and beverages to avoid. Contact your dietitian for more information. °WHERE CAN I FIND MORE INFORMATION? °National Heart, Lung, and [employer] Institute: www.nhlbi.nih.gov/health/health-topics/topics/dash/ °Document Released: 01/04/2011 Document Revised: 06/01/2013 Document Reviewed: 11/19/2012 °ExitCare® Patient Information ©2015 [employer]. This information is not intended to replace advice given to you by your health care provider. Make sure you discuss any questions you have with your health care provider. ° °

## 2014-06-01 NOTE — Progress Notes (Signed)
Pre visit review using our clinic review tool, if applicable. No additional management support is needed unless otherwise documented below in the visit note. 

## 2014-06-08 ENCOUNTER — Other Ambulatory Visit (INDEPENDENT_AMBULATORY_CARE_PROVIDER_SITE_OTHER): Payer: BLUE CROSS/BLUE SHIELD

## 2014-06-08 DIAGNOSIS — R739 Hyperglycemia, unspecified: Secondary | ICD-10-CM | POA: Diagnosis not present

## 2014-06-08 DIAGNOSIS — I10 Essential (primary) hypertension: Secondary | ICD-10-CM

## 2014-06-08 DIAGNOSIS — E782 Mixed hyperlipidemia: Secondary | ICD-10-CM | POA: Diagnosis not present

## 2014-06-08 LAB — CBC
HEMATOCRIT: 42.9 % (ref 36.0–46.0)
Hemoglobin: 14.9 g/dL (ref 12.0–15.0)
MCHC: 34.7 g/dL (ref 30.0–36.0)
MCV: 86 fl (ref 78.0–100.0)
PLATELETS: 173 10*3/uL (ref 150.0–400.0)
RBC: 4.99 Mil/uL (ref 3.87–5.11)
RDW: 14.1 % (ref 11.5–15.5)
WBC: 7 10*3/uL (ref 4.0–10.5)

## 2014-06-08 LAB — COMPREHENSIVE METABOLIC PANEL
ALBUMIN: 3.7 g/dL (ref 3.5–5.2)
ALK PHOS: 84 U/L (ref 39–117)
ALT: 39 U/L — AB (ref 0–35)
AST: 37 U/L (ref 0–37)
BUN: 14 mg/dL (ref 6–23)
CALCIUM: 9.5 mg/dL (ref 8.4–10.5)
CHLORIDE: 102 meq/L (ref 96–112)
CO2: 27 mEq/L (ref 19–32)
Creatinine, Ser: 0.78 mg/dL (ref 0.40–1.20)
GFR: 80.57 mL/min (ref >60.00)
Glucose, Bld: 305 mg/dL — ABNORMAL HIGH (ref 70–99)
POTASSIUM: 4.3 meq/L (ref 3.5–5.1)
SODIUM: 135 meq/L (ref 135–145)
TOTAL PROTEIN: 6.5 g/dL (ref 6.0–8.3)
Total Bilirubin: 0.9 mg/dL (ref 0.2–1.2)

## 2014-06-08 LAB — LIPID PANEL
Cholesterol: 153 mg/dL (ref 0–200)
HDL: 60.2 mg/dL (ref >39.00)
LDL Cholesterol: 72 mg/dL (ref 0–99)
NonHDL: 92.8
Total CHOL/HDL Ratio: 3
Triglycerides: 102 mg/dL (ref 0.0–149.0)
VLDL: 20.4 mg/dL (ref 0.0–40.0)

## 2014-06-08 LAB — TSH: TSH: 2.32 u[IU]/mL (ref 0.35–4.50)

## 2014-06-08 LAB — HEMOGLOBIN A1C: Hgb A1c MFr Bld: 10.1 % — ABNORMAL HIGH (ref 4.6–6.5)

## 2014-06-09 ENCOUNTER — Telehealth: Payer: Self-pay

## 2014-06-09 MED ORDER — METFORMIN HCL 500 MG PO TABS: 500.0000 mg | ORAL_TABLET | Freq: Two times a day (BID) | ORAL | Status: DC

## 2014-06-09 NOTE — Telephone Encounter (Signed)
Notified of results, Metformin ordered states she will call back on best brand of strips and lancets to order. Pt states she is having a lot of sweating since she started the metoprolol and wants to know if this is normal.

## 2014-06-09 NOTE — Telephone Encounter (Signed)
No sweating is not a common side effect of Metoprolol. If this persists we can try something else.

## 2014-06-09 NOTE — Telephone Encounter (Signed)
-----   Message from Bradd CanaryStacey A Blyth, MD sent at 06/08/2014 10:57 PM EDT ----- Labs good except sugar is very hi, she is now a type 2 diabetic and she needs to get a glucometer and check her BS q am and prn and write down numbers, disp lancets and test strips with same sig and #100, 3 rf. Start Metformin 500 mg tabs 1 tab po bid, disp #60 with 2 rf then she should come in in a month to review sugar log. needs minimize simple carbs. Increase exercise as tolerated.

## 2014-06-10 ENCOUNTER — Other Ambulatory Visit: Payer: Self-pay | Admitting: Family Medicine

## 2014-06-10 MED ORDER — ONETOUCH ULTRA SYSTEM W/DEVICE KIT: PACK | Status: DC

## 2014-06-10 MED ORDER — GLUCOSE BLOOD VI STRP: ORAL_STRIP | Status: DC

## 2014-06-10 MED ORDER — LANCETS MISC: Status: DC

## 2014-06-15 ENCOUNTER — Telehealth: Payer: Self-pay

## 2014-06-16 NOTE — Telephone Encounter (Signed)
No call made 

## 2014-06-17 ENCOUNTER — Telehealth: Payer: Self-pay | Admitting: Family Medicine

## 2014-06-17 NOTE — Telephone Encounter (Signed)
Forms received via fax today. Forwarded to Dr. Abner GreenspanBlyth. JG//CMA

## 2014-06-17 NOTE — Telephone Encounter (Signed)
Caller name: Efraim KaufmannMelissa w/[employer] Relationship to patient: employer Can be reached: [phone] ext [phone]  Reason for call: Calling to follow up on faxed request for medical condition, frequency and regulation of bathroom breaks recommended by MD, etc. States request faxed 06/15/14 and 06/16/14 to [phone].

## 2014-06-19 ENCOUNTER — Encounter: Payer: Self-pay | Admitting: Family Medicine

## 2014-06-19 DIAGNOSIS — R519 Headache, unspecified: Secondary | ICD-10-CM | POA: Insufficient documentation

## 2014-06-19 DIAGNOSIS — R51 Headache: Secondary | ICD-10-CM

## 2014-06-19 DIAGNOSIS — G479 Sleep disorder, unspecified: Secondary | ICD-10-CM | POA: Insufficient documentation

## 2014-06-19 NOTE — Assessment & Plan Note (Signed)
Snoring, am Headache, fatigue, HTN, obesity, restless sleeper. Will order sleep study and refer to pulmonology for further management.

## 2014-06-19 NOTE — Assessment & Plan Note (Signed)
Encouraged DASH diet, decrease po intake and increase exercise as tolerated. Needs 7-8 hours of sleep nightly. Avoid trans fats, eat small, frequent meals every 4-5 hours with lean proteins, complex carbs and healthy fats. Minimize simple carbs, GMO foods. 

## 2014-06-19 NOTE — Assessment & Plan Note (Signed)
Poorly controlled will alter medications, encouraged DASH diet, minimize caffeine and obtain adequate sleep. Report concerning symptoms and follow up as directed and as needed 

## 2014-06-19 NOTE — Progress Notes (Signed)
Stephanie Cordova  [id number] September 12, 1955 06/19/2014      Progress Note-Follow Up  Subjective  Chief Complaint  Chief Complaint  Patient presents with  . Insomnia    Pt states within the past few months she feels like she sleeps with her mouth closed. States she was told that she snores loudly.   . Medication Problem    States not taking any of her medications because they made her feel weird     HPI  Patient is a 59 y.o. female in today for routine medical care. Patient in today with numerous concerns. She is having significant difficulty with sleep. Has trouble initiating and maintaining sleep, wakes frequently. Frequently wakes with morning headaches. Acknowledges witness snoring. Lipid pressure continues to be difficult to control. No recent illness. Denies CP/palp/SOB/HA/congestion/fevers/GI or GU c/o. Taking meds as prescribed  Past Medical History  Diagnosis Date  . Hypertension   . Personal history of colonic polyps   . Insomnia   . Obesity   . Hot flash, menopausal   . Arthritis     b/l knees, bone on bone  . Allergy     seaonal and greens  . Preventative health care 11/30/2012  . Personal history of colonic polyps 11/30/2012    Sees Dr Randa Evens at [place]  . Insomnia 11/30/2012  . HTN (hypertension) 11/30/2012  . Hyperglycemia 12/21/2012  . Fatty infiltration of liver 12/21/2012  . Diabetes mellitus type 2 in obese 12/21/2012    Past Surgical History  Procedure Laterality Date  . Abdominal hysterectomy  1996  . Tonsillectomy  1968  . Knee arthroscopy Right 1996 and 2007    Family History  Problem Relation Age of Onset  . Hypertension Mother   . Hyperlipidemia Mother   . Leukemia Mother   . Cancer Mother   . Hypertension Father   . Hyperlipidemia Father   . Diabetes Father   . Heart disease Father   . Alzheimer's disease Father   . Multiple sclerosis Son   . Heart disease Maternal Grandfather     History   Social History  . Marital Status: Widowed   Spouse Name: N/A  . Number of Children: N/A  . Years of Education: N/A   Occupational History  . Not on file.   Social History Main Topics  . Smoking status: Never Smoker   . Smokeless tobacco: Never Used  . Alcohol Use: No  . Drug Use: No  . Sexual Activity: No     Comment: lives alone, widowed in 2006   Other Topics Concern  . Not on file   Social History Narrative    No current outpatient prescriptions on file prior to visit.   No current facility-administered medications on file prior to visit.    Allergies  Allergen Reactions  . Losartan Palpitations  . Sulfa Antibiotics Rash    "Burning" rash    Review of Systems  Review of Systems  Constitutional: Negative for fever, chills and malaise/fatigue.  HENT: Negative for congestion, hearing loss and nosebleeds.   Eyes: Negative for discharge.  Respiratory: Negative for cough, sputum production, shortness of breath and wheezing.   Cardiovascular: Negative for chest pain, palpitations and leg swelling.  Gastrointestinal: Negative for heartburn, nausea, vomiting, abdominal pain, diarrhea, constipation and blood in stool.  Genitourinary: Negative for dysuria, urgency, frequency and hematuria.  Musculoskeletal: Negative for myalgias, back pain and falls.  Skin: Negative for rash.  Neurological: Negative for dizziness, tremors, sensory change, focal weakness, loss of  consciousness, weakness and headaches.  Endo/Heme/Allergies: Negative for polydipsia. Does not bruise/bleed easily.  Psychiatric/Behavioral: Negative for depression and suicidal ideas. The patient is not nervous/anxious and does not have insomnia.     Objective  Ht 5\' 3"  (1.6 m)  Wt 249 lb (112.946 kg)  BMI 44.12 kg/m2  LMP 01/29/1994  Physical Exam  Physical Exam  Constitutional: She is oriented to person, place, and time and well-developed, well-nourished, and in no distress. No distress.  obesity  HENT:  Head: Normocephalic and atraumatic.    Right Ear: External ear normal.  Left Ear: External ear normal.  Nose: Nose normal.  Mouth/Throat: Oropharynx is clear and moist. No oropharyngeal exudate.  Eyes: Conjunctivae are normal. Pupils are equal, round, and reactive to light. Right eye exhibits no discharge. Left eye exhibits no discharge. No scleral icterus.  Neck: Normal range of motion. Neck supple. No thyromegaly present.  Cardiovascular: Normal rate, regular rhythm, normal heart sounds and intact distal pulses.   No murmur heard. Pulmonary/Chest: Effort normal and breath sounds normal. No respiratory distress. She has no wheezes. She has no rales.  Abdominal: Soft. Bowel sounds are normal. She exhibits no distension and no mass. There is no tenderness.  Musculoskeletal: Normal range of motion. She exhibits no edema or tenderness.  Lymphadenopathy:    She has no cervical adenopathy.  Neurological: She is alert and oriented to person, place, and time. She has normal reflexes. No cranial nerve deficit. Coordination normal.  Skin: Skin is warm and dry. No rash noted. She is not diaphoretic.  Psychiatric: Mood, memory and affect normal.    Lab Results  Component Value Date   TSH 2.32 06/08/2014   Lab Results  Component Value Date   WBC 7.0 06/08/2014   HGB 14.9 06/08/2014   HCT 42.9 06/08/2014   MCV 86.0 06/08/2014   PLT 173.0 06/08/2014   Lab Results  Component Value Date   CREATININE 0.78 06/08/2014   BUN 14 06/08/2014   NA 135 06/08/2014   K 4.3 06/08/2014   CL 102 06/08/2014   CO2 27 06/08/2014   Lab Results  Component Value Date   ALT 39* 06/08/2014   AST 37 06/08/2014   ALKPHOS 84 06/08/2014   BILITOT 0.9 06/08/2014   Lab Results  Component Value Date   CHOL 153 06/08/2014   Lab Results  Component Value Date   HDL 60.20 06/08/2014   Lab Results  Component Value Date   LDLCALC 72 06/08/2014   Lab Results  Component Value Date   TRIG 102.0 06/08/2014   Lab Results  Component Value Date    CHOLHDL 3 06/08/2014     Assessment & Plan  HTN (hypertension) Poorly controlled will alter medications, encouraged DASH diet, minimize caffeine and obtain adequate sleep. Report concerning symptoms and follow up as directed and as needed   Obesity Encouraged DASH diet, decrease po intake and increase exercise as tolerated. Needs 7-8 hours of sleep nightly. Avoid trans fats, eat small, frequent meals every 4-5 hours with lean proteins, complex carbs and healthy fats. Minimize simple carbs, GMO foods.   Restless sleeper Snoring, am Headache, fatigue, HTN, obesity, restless sleeper. Will order sleep study and refer to pulmonology for further management.    Diabetes mellitus type 2 in obese hgba1c unacceptable, minimize simple carbs. Increase exercise as tolerated. This will address fatty liver as well. Add Metformin and recheck A1C in 3 months

## 2014-06-19 NOTE — Assessment & Plan Note (Addendum)
hgba1c unacceptable, minimize simple carbs. Increase exercise as tolerated. This will address fatty liver as well. Add Metformin and recheck A1C in 3 months

## 2014-06-22 NOTE — Telephone Encounter (Signed)
Completed form faxed to [employer] successfully at [phone]. JG//CMA

## 2014-06-29 ENCOUNTER — Ambulatory Visit (INDEPENDENT_AMBULATORY_CARE_PROVIDER_SITE_OTHER): Payer: BLUE CROSS/BLUE SHIELD | Admitting: Emergency Medicine

## 2014-06-29 ENCOUNTER — Encounter: Payer: Self-pay | Admitting: Emergency Medicine

## 2014-06-29 VITALS — BP 154/92 | HR 89 | Ht 63.0 in | Wt 250.0 lb

## 2014-06-29 DIAGNOSIS — R0683 Snoring: Secondary | ICD-10-CM

## 2014-06-29 NOTE — Patient Instructions (Signed)
We will work on getting your sleep study performed sooner than July 22. If possible we will add you to cancellation list at the sleep lab Follow with Dr Delton CoombesByrum in August after the sleep study to review the sleep study

## 2014-06-29 NOTE — Assessment & Plan Note (Signed)
I have a high suspicion based on her witnessed apneas that she does have obstructive sleep apnea. Interestingly she does not have napping or fall asleep unintentionally. She has a split-night sleep study Artie ordered for July 27 and we will keep this appointment. If we can move it up sooner then we will do so.

## 2014-06-29 NOTE — Progress Notes (Signed)
Subjective:    Patient ID: Stephanie Cordova, female    DOB: [birth year], 59 y.o.   MRN: [id number]  HPI  59 year old never smoker, history of hypertension,allergic rhinitis, obesity and diabetes. She is referred today for possible sleep apnea.  She has snoring, she has also been told that she has apneas (by her aunt). She does not always feel well rested every day. Occasionally wakes w a dull HA 2-3x a week. She does not sleep during the day, not driving. Does not nap. She falls asleep quickly at night, gets up once a night to urinate.  Reviewed Dr Azalee Course notes, labs from 06/08/14.    Review of Systems  Constitutional: Negative for fever and unexpected weight change.  HENT: Negative for congestion, dental problem, ear pain, nosebleeds, postnasal drip, rhinorrhea, sinus pressure, sneezing and trouble swallowing.   Eyes: Negative for redness and itching.  Respiratory: Positive for shortness of breath. Negative for cough and chest tightness.   Cardiovascular: Negative for palpitations and leg swelling.  Gastrointestinal: Negative for nausea and vomiting.  Genitourinary: Negative for dysuria.  Musculoskeletal: Negative for joint swelling.  Skin: Negative for rash.  Neurological: Positive for headaches.  Hematological: Does not bruise/bleed easily.  Psychiatric/Behavioral: Negative for dysphoric mood. The patient is not nervous/anxious.    Past Medical History  Diagnosis Date  . Hypertension   . Personal history of colonic polyps   . Insomnia   . Obesity   . Hot flash, menopausal   . Arthritis     b/l knees, bone on bone  . Allergy     seaonal and greens  . Preventative health care 11/30/2012  . Personal history of colonic polyps 11/30/2012    Sees Dr Oletta Lamas at [place]  . Insomnia 11/30/2012  . HTN (hypertension) 11/30/2012  . Hyperglycemia 12/21/2012  . Fatty infiltration of liver 12/21/2012  . Diabetes mellitus type 2 in obese 12/21/2012     Family History  Problem Relation Age of  Onset  . Hypertension Mother   . Hyperlipidemia Mother   . Leukemia Mother   . Cancer Mother   . Hypertension Father   . Hyperlipidemia Father   . Diabetes Father   . Heart disease Father   . Alzheimer's disease Father   . Multiple sclerosis Son   . Heart disease Maternal Grandfather      History   Social History  . Marital Status: Widowed    Spouse Name: N/A  . Number of Children: N/A  . Years of Education: N/A   Occupational History  . Customer service    Social History Main Topics  . Smoking status: Never Smoker   . Smokeless tobacco: Never Used  . Alcohol Use: No  . Drug Use: No  . Sexual Activity: No     Comment: lives alone, widowed in 2006   Other Topics Concern  . Not on file   Social History Narrative     Allergies  Allergen Reactions  . Losartan Palpitations  . Sulfa Antibiotics Rash    "Burning" rash     Outpatient Prescriptions Prior to Visit  Medication Sig Dispense Refill  . Blood Glucose Monitoring Suppl (ONE TOUCH ULTRA SYSTEM KIT) W/DEVICE KIT Use as directed twice daily to check blood sugar.  DX E11.9 1 each 0  . glucose blood test strip Use as instructed 100 each 6  . Lancets MISC Check blood sugar twice daily. DX code E11.9 100 each 6  . lisinopril (PRINIVIL,ZESTRIL) 5 MG tablet Take 1  tablet (5 mg total) by mouth daily. 90 tablet 3  . metoprolol (LOPRESSOR) 100 MG tablet Take 1 tablet (100 mg total) by mouth 2 (two) times daily. 60 tablet 3  . triamterene-hydrochlorothiazide (MAXZIDE-25) 37.5-25 MG per tablet Take 1 tablet by mouth daily. 90 tablet 3  . metFORMIN (GLUCOPHAGE) 500 MG tablet Take 1 tablet (500 mg total) by mouth 2 (two) times daily with a meal. 60 tablet 2   No facility-administered medications prior to visit.         Objective:   Physical Exam Filed Vitals:   06/29/14 1629  BP: 154/92  Pulse: 89  Height: 5' 3"  (1.6 m)  Weight: 250 lb (113.399 kg)  SpO2: 97%  Gen: Pleasant, obese woman, in no distress,  normal  affect  ENT: No lesions,  mouth clear,  oropharynx clear, no postnasal drip, crowded posterior pharynx  Neck: No JVD, no TMG, no carotid bruits  Lungs: No use of accessory muscles, clear without rales or rhonchi  Cardiovascular: RRR, heart sounds normal, no murmur or gallops, no peripheral edema  Musculoskeletal: No deformities, no cyanosis or clubbing  Neuro: alert, non focal  Skin: Warm, no lesions or rashes     Assessment & Plan:  Snoring I have a high suspicion based on her witnessed apneas that she does have obstructive sleep apnea. Interestingly she does not have napping or fall asleep unintentionally. She has a split-night sleep study Artie ordered for July 27 and we will keep this appointment. If we can move it up sooner then we will do so.

## 2014-07-06 ENCOUNTER — Ambulatory Visit (INDEPENDENT_AMBULATORY_CARE_PROVIDER_SITE_OTHER): Payer: BLUE CROSS/BLUE SHIELD | Admitting: Family Medicine

## 2014-07-06 ENCOUNTER — Encounter: Payer: Self-pay | Admitting: Family Medicine

## 2014-07-06 VITALS — BP 130/84 | HR 92 | Temp 98.4°F | Ht 63.0 in | Wt 250.1 lb

## 2014-07-06 DIAGNOSIS — E119 Type 2 diabetes mellitus without complications: Secondary | ICD-10-CM | POA: Diagnosis not present

## 2014-07-06 DIAGNOSIS — E669 Obesity, unspecified: Secondary | ICD-10-CM

## 2014-07-06 DIAGNOSIS — I10 Essential (primary) hypertension: Secondary | ICD-10-CM

## 2014-07-06 DIAGNOSIS — R011 Cardiac murmur, unspecified: Secondary | ICD-10-CM | POA: Diagnosis not present

## 2014-07-06 DIAGNOSIS — R252 Cramp and spasm: Secondary | ICD-10-CM | POA: Diagnosis not present

## 2014-07-06 DIAGNOSIS — E1169 Type 2 diabetes mellitus with other specified complication: Secondary | ICD-10-CM

## 2014-07-06 DIAGNOSIS — G4700 Insomnia, unspecified: Secondary | ICD-10-CM

## 2014-07-06 MED ORDER — METFORMIN HCL 500 MG PO TABS: 1000.0000 mg | ORAL_TABLET | Freq: Two times a day (BID) | ORAL | Status: DC

## 2014-07-06 NOTE — Progress Notes (Signed)
Pre visit review using our clinic review tool, if applicable. No additional management support is needed unless otherwise documented below in the visit note. 

## 2014-07-06 NOTE — Patient Instructions (Signed)

## 2014-07-13 ENCOUNTER — Other Ambulatory Visit (INDEPENDENT_AMBULATORY_CARE_PROVIDER_SITE_OTHER): Payer: BLUE CROSS/BLUE SHIELD

## 2014-07-13 DIAGNOSIS — R252 Cramp and spasm: Secondary | ICD-10-CM | POA: Diagnosis not present

## 2014-07-13 LAB — COMPREHENSIVE METABOLIC PANEL
ALBUMIN: 4.1 g/dL (ref 3.5–5.2)
ALT: 42 U/L — AB (ref 0–35)
AST: 37 U/L (ref 0–37)
Alkaline Phosphatase: 79 U/L (ref 39–117)
BUN: 11 mg/dL (ref 6–23)
CALCIUM: 9.5 mg/dL (ref 8.4–10.5)
CHLORIDE: 100 meq/L (ref 96–112)
CO2: 25 meq/L (ref 19–32)
Creatinine, Ser: 0.92 mg/dL (ref 0.40–1.20)
GFR: 66.57 mL/min (ref >60.00)
Glucose, Bld: 318 mg/dL — ABNORMAL HIGH (ref 70–99)
Potassium: 4.1 mEq/L (ref 3.5–5.1)
Sodium: 133 mEq/L — ABNORMAL LOW (ref 135–145)
Total Bilirubin: 0.7 mg/dL (ref 0.2–1.2)
Total Protein: 6.6 g/dL (ref 6.0–8.3)

## 2014-07-13 LAB — MAGNESIUM: MAGNESIUM: 2 mg/dL (ref 1.5–2.5)

## 2014-07-14 ENCOUNTER — Ambulatory Visit (HOSPITAL_BASED_OUTPATIENT_CLINIC_OR_DEPARTMENT_OTHER)
Admission: RE | Admit: 2014-07-14 | Discharge: 2014-07-14 | Disposition: A | Discharge status: Home / Self Care | Payer: BLUE CROSS/BLUE SHIELD | Source: Ambulatory Visit | Attending: Family Medicine | Admitting: Family Medicine

## 2014-07-14 DIAGNOSIS — R011 Cardiac murmur, unspecified: Secondary | ICD-10-CM | POA: Insufficient documentation

## 2014-07-14 NOTE — Progress Notes (Signed)
  Echocardiogram 2D Echocardiogram has been performed.  Stephanie Cordova 07/14/2014, 10:54 AM

## 2014-07-17 NOTE — Assessment & Plan Note (Signed)
Encouraged good sleep hygiene such as dark, quiet room. No blue/green glowing lights such as computer screens in bedroom. No alcohol or stimulants in evening. Cut down on caffeine as able. Regular exercise is helpful but not just prior to bed time.  

## 2014-07-17 NOTE — Assessment & Plan Note (Signed)
hgba1c unacceptable, minimize simple carbs. Increase exercise as tolerated. Continue current meds but will increase. Referred to nutritionist for further consideration.

## 2014-07-17 NOTE — Progress Notes (Signed)
Stephanie Cordova  [id number] [birth year] 07/17/2014      Progress Note-Follow Up  Subjective  Chief Complaint  Chief Complaint  Patient presents with  . Follow-up    labs and medications    HPI  Patient is a 59 y.o. female in today for routine medical care. Patient is in today for follow-up. She is feeling fairly well. She technologist some polyuria and polydipsia. No recent illness. Denies CP/palp/SOB/HA/congestion/fevers/GI or GU c/o. Taking meds as prescribed  Past Medical History  Diagnosis Date  . Hypertension   . Personal history of colonic polyps   . Insomnia   . Obesity   . Hot flash, menopausal   . Arthritis     b/l knees, bone on bone  . Allergy     seaonal and greens  . Preventative health care 11/30/2012  . Personal history of colonic polyps 11/30/2012    Sees Dr Oletta Lamas at [place]  . Insomnia 11/30/2012  . HTN (hypertension) 11/30/2012  . Hyperglycemia 12/21/2012  . Fatty infiltration of liver 12/21/2012  . Diabetes mellitus type 2 in obese 12/21/2012    Past Surgical History  Procedure Laterality Date  . Abdominal hysterectomy  1996  . Tonsillectomy  1968  . Knee arthroscopy Right 1996 and 2007  . Tubal ligation      Family History  Problem Relation Age of Onset  . Hypertension Mother   . Hyperlipidemia Mother   . Leukemia Mother   . Cancer Mother   . Hypertension Father   . Hyperlipidemia Father   . Diabetes Father   . Heart disease Father   . Alzheimer's disease Father   . Multiple sclerosis Son   . Heart disease Maternal Grandfather     History   Social History  . Marital Status: Widowed    Spouse Name: N/A  . Number of Children: N/A  . Years of Education: N/A   Occupational History  . Customer service    Social History Main Topics  . Smoking status: Never Smoker   . Smokeless tobacco: Never Used  . Alcohol Use: No  . Drug Use: No  . Sexual Activity: No     Comment: lives alone, widowed in 2006   Other Topics Concern  . Not on  file   Social History Narrative    Current Outpatient Prescriptions on File Prior to Visit  Medication Sig Dispense Refill  . Blood Glucose Monitoring Suppl (ONE TOUCH ULTRA SYSTEM KIT) W/DEVICE KIT Use as directed twice daily to check blood sugar.  DX E11.9 1 each 0  . glucose blood test strip Use as instructed 100 each 6  . Lancets MISC Check blood sugar twice daily. DX code E11.9 100 each 6  . triamterene-hydrochlorothiazide (MAXZIDE-25) 37.5-25 MG per tablet Take 1 tablet by mouth daily. 90 tablet 3  . lisinopril (PRINIVIL,ZESTRIL) 5 MG tablet Take 1 tablet (5 mg total) by mouth daily. 90 tablet 3  . metoprolol (LOPRESSOR) 100 MG tablet Take 1 tablet (100 mg total) by mouth 2 (two) times daily. 60 tablet 3   No current facility-administered medications on file prior to visit.    Allergies  Allergen Reactions  . Losartan Palpitations  . Sulfa Antibiotics Rash    "Burning" rash    Review of Systems  Review of Systems  Constitutional: Positive for malaise/fatigue. Negative for fever.  HENT: Negative for congestion.   Eyes: Negative for discharge.  Respiratory: Negative for shortness of breath.   Cardiovascular: Negative for chest pain, palpitations  and leg swelling.  Gastrointestinal: Negative.  Negative for nausea, abdominal pain and diarrhea.  Genitourinary: Negative for dysuria.  Musculoskeletal: Negative for falls.  Skin: Negative for rash.  Neurological: Negative for loss of consciousness and headaches.  Endo/Heme/Allergies: Negative for polydipsia.  Psychiatric/Behavioral: Negative for depression and suicidal ideas. The patient has insomnia. The patient is not nervous/anxious.     Objective  BP 130/84 mmHg  Pulse 92  Temp(Src) 98.4 F (36.9 C) (Oral)  Ht _0  (1.6 m)  Wt 250 lb 2 oz (113.456 kg)  BMI 44.32 kg/m2  SpO2 96%  LMP 01/29/1994  Physical Exam  Physical Exam  Constitutional: She is oriented to person, place, and time and well-developed,  well-nourished, and in no distress. No distress.  HENT:  Head: Normocephalic and atraumatic.  Eyes: Conjunctivae are normal.  Neck: Neck supple. No thyromegaly present.  Cardiovascular: Normal rate and regular rhythm.  Exam reveals no gallop.   No murmur heard. Pulmonary/Chest: Effort normal and breath sounds normal. She has no wheezes.  Abdominal: She exhibits no distension and no mass.  Musculoskeletal: She exhibits no edema.  Lymphadenopathy:    She has no cervical adenopathy.  Neurological: She is alert and oriented to person, place, and time.  Skin: Skin is warm and dry. No rash noted. She is not diaphoretic.  Psychiatric: Memory, affect and judgment normal.    Lab Results  Component Value Date   TSH 2.32 06/08/2014   Lab Results  Component Value Date   WBC 7.0 06/08/2014   HGB 14.9 06/08/2014   HCT 42.9 06/08/2014   MCV 86.0 06/08/2014   PLT 173.0 06/08/2014   Lab Results  Component Value Date   CREATININE 0.92 07/13/2014   BUN 11 07/13/2014   NA 133* 07/13/2014   K 4.1 07/13/2014   CL 100 07/13/2014   CO2 25 07/13/2014   Lab Results  Component Value Date   ALT 42* 07/13/2014   AST 37 07/13/2014   ALKPHOS 79 07/13/2014   BILITOT 0.7 07/13/2014   Lab Results  Component Value Date   CHOL 153 06/08/2014   Lab Results  Component Value Date   HDL 60.20 06/08/2014   Lab Results  Component Value Date   LDLCALC 72 06/08/2014   Lab Results  Component Value Date   TRIG 102.0 06/08/2014   Lab Results  Component Value Date   CHOLHDL 3 06/08/2014     Assessment & Plan  HTN (hypertension) Adequate controlled, no changes to meds. Encouraged heart healthy diet such as the DASH diet and exercise as tolerated.   Diabetes mellitus type 2 in obese hgba1c unacceptable, minimize simple carbs. Increase exercise as tolerated. Continue current meds but will increase. Referred to nutritionist for further consideration.  Insomnia Encouraged good sleep hygiene  such as dark, quiet room. No blue/green glowing lights such as computer screens in bedroom. No alcohol or stimulants in evening. Cut down on caffeine as able. Regular exercise is helpful but not just prior to bed time.   Obesity Referred to nutritionist. Encouraged DASH diet, decrease po intake and increase exercise as tolerated. Needs 7-8 hours of sleep nightly. Avoid trans fats, eat small, frequent meals every 4-5 hours with lean proteins, complex carbs and healthy fats. Minimize simple carbs, GMO foods.

## 2014-07-17 NOTE — Assessment & Plan Note (Signed)
Referred to nutritionist. Encouraged DASH diet, decrease po intake and increase exercise as tolerated. Needs 7-8 hours of sleep nightly. Avoid trans fats, eat small, frequent meals every 4-5 hours with lean proteins, complex carbs and healthy fats. Minimize simple carbs, GMO foods.

## 2014-07-17 NOTE — Assessment & Plan Note (Signed)
Adequate controlled, no changes to meds. Encouraged heart healthy diet such as the DASH diet and exercise as tolerated.  

## 2014-07-26 ENCOUNTER — Encounter: Payer: Self-pay | Admitting: Family Medicine

## 2014-07-27 MED ORDER — METFORMIN HCL ER (OSM) 1000 MG PO TB24: 2000.0000 mg | ORAL_TABLET | Freq: Every day | ORAL | Status: DC

## 2014-08-17 ENCOUNTER — Encounter: Payer: Self-pay | Admitting: Dietician

## 2014-08-17 ENCOUNTER — Telehealth: Payer: Self-pay | Admitting: Family Medicine

## 2014-08-17 ENCOUNTER — Encounter: Payer: BLUE CROSS/BLUE SHIELD | Attending: Family Medicine | Admitting: Dietician

## 2014-08-17 VITALS — Ht 62.0 in | Wt 249.0 lb

## 2014-08-17 DIAGNOSIS — E669 Obesity, unspecified: Secondary | ICD-10-CM | POA: Diagnosis not present

## 2014-08-17 DIAGNOSIS — Z6841 Body Mass Index (BMI) 40.0 and over, adult: Secondary | ICD-10-CM | POA: Insufficient documentation

## 2014-08-17 DIAGNOSIS — Z713 Dietary counseling and surveillance: Secondary | ICD-10-CM | POA: Diagnosis not present

## 2014-08-17 DIAGNOSIS — E119 Type 2 diabetes mellitus without complications: Secondary | ICD-10-CM | POA: Insufficient documentation

## 2014-08-17 DIAGNOSIS — E1169 Type 2 diabetes mellitus with other specified complication: Secondary | ICD-10-CM

## 2014-08-17 NOTE — Patient Instructions (Signed)
Baked, broiled, or grilled rather than fried and choose lean meats. Aim for 2-3 Carb Choices per meal (30-45 grams) +/- 1 either way  Aim for 0-1 Carbs per snack if hungry  Include protein in moderation with your meals and snacks Consider reading food labels for Total Carbohydrate and Fat Grams of foods Consider  increasing your activity level by armchair exercises for 10-20 minutes daily as tolerated Consider enquiring about the [employer] Silver [employer]neakers. Consider checking BG at alternate times per day as directed by MD

## 2014-08-17 NOTE — Telephone Encounter (Signed)
Called the patient informed of PCP instructions on medication.  She agreed to do.

## 2014-08-17 NOTE — Telephone Encounter (Signed)
Try taking the 2 tabs separately, one in am and one in afternoon and can try taking a ginger capsule 1/2 hour prior to doses and eat something. Let us know if that does not help

## 2014-08-17 NOTE — Telephone Encounter (Signed)
Relation to pt: self  Call back number: [phone] Pharmacy:  [id number] - [place], [place] - [address] [phone] (Phone) [phone] (Fax)         Reason for call:  Patient states metformin (FORTAMET) 1000 MG (OSM) 24 hr tablet is causing her to feel nauseous. In need of clinical advice.

## 2014-08-20 NOTE — Progress Notes (Signed)
Diabetes Self-Management Education  Visit Type: First/Initial  Appt. Start Time: 0945 Appt. End Time: 1100  08/20/2014  Ms. Stephanie Cordova, identified by name and date of birth, is a 59 y.o. female with a diagnosis of Diabetes: Type 2.  Other people present during visit:  Patient   Patient lives alone.  She works for the [employer] center and part time as a travel agent.  She enjoys spending time with 2 grandchildren.  Hx includes type 2 diabetes for the past 1 1/2 years, HTN, and multiple allergies including foods (broccoli, cabbage lettuce, greens cause vomiting).  She dislikes whole wheat.  In 2000 she lost 50 lbs then regained.  Multiple relatives with diabetes.  ASSESSMENT  Height  (1.575 m), weight 249 lb (112.946 kg), last menstrual period 01/29/1994. Body mass index is 45.53 kg/(m^2).  Initial Visit Information:  Are you currently following a meal plan?: No (cutting back on sugar and using stevia and starting to limit CHO)   Are you taking your medications as prescribed?: Yes (Having problems with diarrhea and nausea and vomiting with metformin) Are you checking your feet?: No   How often do you need to have someone help you when you read instructions, pamphlets, or other written materials from your doctor or pharmacy?: 1 - Never What is the last grade level you completed in school?: 2 years college  Psychosocial:   Patient Belief/Attitude about Diabetes: Motivated to manage diabetes Self-care barriers: None Self-management support: Doctor's office Other persons present: Patient Patient Concerns: Nutrition/Meal planning, Weight Control, Healthy Lifestyle, Other (comment) (reading labels) Special Needs: None Preferred Learning Style: No preference indicated Learning Readiness: Ready  Complications:   Last HgB A1C per patient/outside source: 10.1 mg/dL (8/65/78 new dx.  increased from 6.2% 1 1/2 years ago.) How often do you check your blood sugar?: 1-2  times/day Fasting Blood glucose range (mg/dL): >469 (629 with metformin, 300 when unable to take the metformin) Postprandial Blood glucose range (mg/dL): 528-413 (2 hours after meal) Number of hypoglycemic episodes per month: 0 Number of hyperglycemic episodes per week: 7 Can you tell when your blood sugar is high?: No Have you had a dilated eye exam in the past 12 months?: No Have you had a dental exam in the past 12 months?: Yes  Diet Intake:  Breakfast: 3 boiled eggs or 2 scrambled eggs and tomatoes or yogurt and yogurt and 1 slice white toast (7:30 or 8) Snack (morning): [place] balanced breaks (fruit and nuts) and fresh fruit (12:30) Lunch: Hot dog with mustard, chili, cheese OR chicken casserole (3:00) Snack (afternoon): none Dinner: 2 scoops tuna and crackers from [employer] OR soft taco with sour cream OR hotdog OR weekend potatoes or macaroni with meat OR tomato sandwich Snack (evening): none Beverage(s): water, unsweet tea, diet Dr. Reino Kent occasionally  Exercise:  Exercise: ADL's (knee problems)  Individualized Plan for Diabetes Self-Management Training:   Learning Objective:  Patient will have a greater understanding of diabetes self-management.  Patient education plan per assessed needs and concerns is to attend individual sessions for     Education Topics Reviewed with Patient Today:  Definition of diabetes, type 1 and 2, and the diagnosis of diabetes Role of diet in the treatment of diabetes and the relationship between the three main macronutrients and blood glucose level, Food label reading, portion sizes and measuring food., Carbohydrate counting Role of exercise on diabetes management, blood pressure control and cardiac health.   Identified appropriate SMBG and/or A1C goals., Daily foot  exams, Yearly dilated eye exam   Relationship between chronic complications and blood glucose control Worked with patient to identify barriers to care and solutions, Role of stress  on diabetes      PATIENTS GOALS/Plan (Developed by the patient):  Nutrition: Follow meal plan discussed, General guidelines for healthy choices and portions discussed Physical Activity: Exercise 5-7 days per week, 30 minutes per day Medications: take my medication as prescribed Monitoring : test my blood glucose as discussed (note x per day with comment) (as recommended by MD)  Plan:   Patient Instructions  Baked, broiled, or grilled rather than fried and choose lean meats. Aim for 2-3 Carb Choices per meal (30-45 grams) +/- 1 either way  Aim for 0-1 Carbs per snack if hungry  Include protein in moderation with your meals and snacks Consider reading food labels for Total Carbohydrate and Fat Grams of foods Consider  increasing your activity level by armchair exercises for 10-20 minutes daily as tolerated Consider enquiring about the [employer] Silver [employer]neakers. Consider checking BG at alternate times per day as directed by MD      Expected Outcomes:  Demonstrated interest in learning. Expect positive outcomes  Education material provided: Living Well with Diabetes, Food label handouts, A1C conversion sheet, Meal plan card and Snack sheet  If problems or questions, patient to contact team via:  Phone and Email  Future DSME appointment: 2 months

## 2014-08-25 ENCOUNTER — Ambulatory Visit (HOSPITAL_BASED_OUTPATIENT_CLINIC_OR_DEPARTMENT_OTHER): Payer: BLUE CROSS/BLUE SHIELD | Attending: Family Medicine | Admitting: Radiology

## 2014-08-25 DIAGNOSIS — I493 Ventricular premature depolarization: Secondary | ICD-10-CM | POA: Insufficient documentation

## 2014-08-25 DIAGNOSIS — G4733 Obstructive sleep apnea (adult) (pediatric): Secondary | ICD-10-CM | POA: Insufficient documentation

## 2014-08-25 DIAGNOSIS — R0683 Snoring: Secondary | ICD-10-CM | POA: Insufficient documentation

## 2014-08-25 DIAGNOSIS — I10 Essential (primary) hypertension: Secondary | ICD-10-CM

## 2014-08-25 DIAGNOSIS — E119 Type 2 diabetes mellitus without complications: Secondary | ICD-10-CM | POA: Diagnosis present

## 2014-08-25 DIAGNOSIS — G479 Sleep disorder, unspecified: Secondary | ICD-10-CM

## 2014-08-25 DIAGNOSIS — R51 Headache: Secondary | ICD-10-CM

## 2014-08-25 DIAGNOSIS — R739 Hyperglycemia, unspecified: Secondary | ICD-10-CM

## 2014-08-25 DIAGNOSIS — R519 Headache, unspecified: Secondary | ICD-10-CM

## 2014-08-25 DIAGNOSIS — E669 Obesity, unspecified: Secondary | ICD-10-CM

## 2014-09-04 ENCOUNTER — Other Ambulatory Visit: Payer: Self-pay | Admitting: Family Medicine

## 2014-09-07 ENCOUNTER — Other Ambulatory Visit: Payer: BLUE CROSS/BLUE SHIELD

## 2014-09-08 ENCOUNTER — Other Ambulatory Visit (INDEPENDENT_AMBULATORY_CARE_PROVIDER_SITE_OTHER): Payer: BLUE CROSS/BLUE SHIELD

## 2014-09-08 DIAGNOSIS — E119 Type 2 diabetes mellitus without complications: Secondary | ICD-10-CM | POA: Diagnosis not present

## 2014-09-08 DIAGNOSIS — E669 Obesity, unspecified: Secondary | ICD-10-CM

## 2014-09-08 DIAGNOSIS — R252 Cramp and spasm: Secondary | ICD-10-CM

## 2014-09-08 DIAGNOSIS — I10 Essential (primary) hypertension: Secondary | ICD-10-CM | POA: Diagnosis not present

## 2014-09-08 DIAGNOSIS — E1169 Type 2 diabetes mellitus with other specified complication: Secondary | ICD-10-CM

## 2014-09-08 LAB — COMPREHENSIVE METABOLIC PANEL
ALT: 73 U/L — ABNORMAL HIGH (ref 0–35)
AST: 45 U/L — ABNORMAL HIGH (ref 0–37)
Albumin: 3.7 g/dL (ref 3.5–5.2)
Alkaline Phosphatase: 79 U/L (ref 39–117)
BUN: 14 mg/dL (ref 6–23)
CHLORIDE: 101 meq/L (ref 96–112)
CO2: 25 meq/L (ref 19–32)
Calcium: 8.9 mg/dL (ref 8.4–10.5)
Creatinine, Ser: 0.78 mg/dL (ref 0.40–1.20)
GFR: 80.5 mL/min (ref >60.00)
GLUCOSE: 388 mg/dL — AB (ref 70–99)
Potassium: 4.1 mEq/L (ref 3.5–5.1)
Sodium: 135 mEq/L (ref 135–145)
TOTAL PROTEIN: 6.2 g/dL (ref 6.0–8.3)
Total Bilirubin: 0.7 mg/dL (ref 0.2–1.2)

## 2014-09-08 LAB — LIPID PANEL
CHOL/HDL RATIO: 2
Cholesterol: 141 mg/dL (ref 0–200)
HDL: 56.5 mg/dL (ref >39.00)
LDL CALC: 66 mg/dL (ref 0–99)
NonHDL: 84.19
Triglycerides: 90 mg/dL (ref 0.0–149.0)
VLDL: 18 mg/dL (ref 0.0–40.0)

## 2014-09-08 LAB — HEMOGLOBIN A1C: Hgb A1c MFr Bld: 8.5 % — ABNORMAL HIGH (ref 4.6–6.5)

## 2014-09-08 LAB — CBC
HCT: 38.4 % (ref 36.0–46.0)
Hemoglobin: 13 g/dL (ref 12.0–15.0)
MCHC: 33.8 g/dL (ref 30.0–36.0)
MCV: 90.2 fl (ref 78.0–100.0)
Platelets: 129 10*3/uL — ABNORMAL LOW (ref 150.0–400.0)
RBC: 4.25 Mil/uL (ref 3.87–5.11)
RDW: 13.9 % (ref 11.5–15.5)
WBC: 5.4 10*3/uL (ref 4.0–10.5)

## 2014-09-08 LAB — TSH: TSH: 1.69 u[IU]/mL (ref 0.35–4.50)

## 2014-09-08 LAB — MICROALBUMIN / CREATININE URINE RATIO
Creatinine,U: 108.2 mg/dL
MICROALB/CREAT RATIO: 0.6 mg/g (ref 0.0–30.0)
Microalb, Ur: 0.7 mg/dL (ref 0.0–1.9)

## 2014-09-10 DIAGNOSIS — G4730 Sleep apnea, unspecified: Secondary | ICD-10-CM | POA: Diagnosis not present

## 2014-09-10 NOTE — Progress Notes (Signed)
Patient Name: Stephanie Cordova, Agent Date: 08/25/2014 Gender: Female D.O.B: 01-Sep-1955 Age (years): 59 Referring Provider: Mosie Lukes Height (inches): 55 Interpreting Physician: Kara Mead MD, ABSM Weight (lbs): 249 RPSGT: Carolin Coy BMI: 44 MRN: [id number] Neck Size: 18.00   CLINICAL INFORMATION Sleep Study Type: Split Night CPAP  Indication for sleep study: Diabetes, Hypertension, Obesity, Snoring  Epworth Sleepiness Score: 1  SLEEP STUDY TECHNIQUE As per the AASM Manual for the Scoring of Sleep and Associated Events v2.3 (April 2016) with a hypopnea requiring 4% desaturations.  The channels recorded and monitored were frontal, central and occipital EEG, electrooculogram (EOG), submentalis EMG (chin), nasal and oral airflow, thoracic and abdominal wall motion, anterior tibialis EMG, snore microphone, electrocardiogram, and pulse oximetry. Continuous positive airway pressure (CPAP) was initiated when the patient met split night criteria and was titrated according to treat sleep-disordered breathing.  MEDICATIONS Medications administered by patient during sleep study : No sleep medicine administered.  RESPIRATORY PARAMETERS Diagnostic  Total AHI (/hr): 10.8 RDI (/hr): 32.8 OA Index (/hr): - CA Index (/hr): 0.0 REM AHI (/hr): 66.7 NREM AHI (/hr): 0.8 Supine AHI (/hr): 29.4 Non-supine AHI (/hr): 2.45 Min O2 Sat (%): 79.00 Mean O2 (%): 94.50 Time below 88% (min): 8.8   Titration  Optimal Pressure (cm): 10 AHI at Optimal Pressure (/hr): 0.0 Min O2 at Optimal Pressure (%): 93.0 Supine % at Optimal (%): 56 Sleep % at Optimal (%): 97    SLEEP ARCHITECTURE The recording time for the entire night was 441.7 minutes.  During a baseline period of 256.7 minutes, the patient slept for 177.5 minutes in REM and nonREM, yielding a sleep efficiency of 69.1%. Sleep onset after lights out was 32.7 minutes with a REM latency of 146.5 minutes. The patient spent 7.89% of the night in stage N1  sleep, 76.34% in stage N2 sleep, 0.56% in stage N3 and 15.21% in REM.  During the titration period of 182.3 minutes, the patient slept for 158.0 minutes in REM and nonREM, yielding a sleep efficiency of 86.7%. Sleep onset after CPAP initiation was 11.8 minutes with a REM latency of 63.0 minutes. The patient spent 4.11% of the night in stage N1 sleep, 75.32% in stage N2 sleep, 0.63% in stage N3 and 19.93% in REM.  CARDIAC DATA The 2 lead EKG demonstrated sinus rhythm. The mean heart rate was 66.53 beats per minute. Other EKG findings include: PVCs.   LEG MOVEMENT DATA The total Periodic Limb Movements of Sleep (PLMS) were 41. The PLMS index was 7.33 .  IMPRESSIONS Mild obstructive sleep apnea occurred during the diagnostic portion of the study (AHI = 10.8 /hour). An optimal PAP pressure was selected for this patient ( 10 cm of water) No significant central sleep apnea occurred during the diagnostic portion of the study (CAI = 0.0/hour). Moderate oxygen desaturation was noted during the diagnostic portion of the study (Min O2 =79.00%). The patient snored with Soft snoring volume during the diagnostic portion of the study. EKG findings include PVCs. Mild periodic limb movements of sleep occurred during the study.   DIAGNOSIS Obstructive Sleep Apnea (327.23 [G47.33 ICD-10])   RECOMMENDATIONS Trial of CPAP therapy on 10 cm H2O with a X-Small size Resmed Full Face Mask AirFit F10 for Her mask and heated humidification. Avoid alcohol, sedatives and other CNS depressants that may worsen sleep apnea and disrupt normal sleep architecture. Sleep hygiene should be reviewed to assess factors that may improve sleep quality. Weight management and regular exercise should be initiated or continued. Return  for re-evaluation after 4 weeks of therapy  Kara Mead MD. FCCP. [facility]

## 2014-09-10 NOTE — Addendum Note (Signed)
Addended by: Oretha Milch on: 09/10/2014 12:22 PM   Modules accepted: Level of Service

## 2014-09-13 ENCOUNTER — Other Ambulatory Visit: Payer: BLUE CROSS/BLUE SHIELD

## 2014-09-14 ENCOUNTER — Encounter: Payer: Self-pay | Admitting: Family Medicine

## 2014-09-14 ENCOUNTER — Telehealth: Payer: Self-pay | Admitting: Emergency Medicine

## 2014-09-14 ENCOUNTER — Ambulatory Visit (INDEPENDENT_AMBULATORY_CARE_PROVIDER_SITE_OTHER): Payer: BLUE CROSS/BLUE SHIELD | Admitting: Family Medicine

## 2014-09-14 VITALS — BP 162/82 | HR 82 | Temp 98.6°F | Ht 62.0 in | Wt 246.4 lb

## 2014-09-14 DIAGNOSIS — E119 Type 2 diabetes mellitus without complications: Secondary | ICD-10-CM

## 2014-09-14 DIAGNOSIS — Z1159 Encounter for screening for other viral diseases: Secondary | ICD-10-CM

## 2014-09-14 DIAGNOSIS — E669 Obesity, unspecified: Secondary | ICD-10-CM

## 2014-09-14 DIAGNOSIS — E1169 Type 2 diabetes mellitus with other specified complication: Secondary | ICD-10-CM

## 2014-09-14 DIAGNOSIS — R0683 Snoring: Secondary | ICD-10-CM

## 2014-09-14 DIAGNOSIS — N951 Menopausal and female climacteric states: Secondary | ICD-10-CM

## 2014-09-14 DIAGNOSIS — G4700 Insomnia, unspecified: Secondary | ICD-10-CM

## 2014-09-14 DIAGNOSIS — G479 Sleep disorder, unspecified: Secondary | ICD-10-CM

## 2014-09-14 DIAGNOSIS — I10 Essential (primary) hypertension: Secondary | ICD-10-CM

## 2014-09-14 MED ORDER — METOPROLOL TARTRATE 100 MG PO TABS: 150.0000 mg | ORAL_TABLET | Freq: Two times a day (BID) | ORAL | Status: DC

## 2014-09-14 MED ORDER — GLIMEPIRIDE 1 MG PO TABS: 1.0000 mg | ORAL_TABLET | Freq: Every day | ORAL | Status: DC

## 2014-09-14 MED ORDER — LISINOPRIL 2.5 MG PO TABS: 2.5000 mg | ORAL_TABLET | Freq: Every day | ORAL | Status: DC

## 2014-09-14 MED ORDER — METFORMIN HCL ER (MOD) 500 MG PO TB24: 500.0000 mg | ORAL_TABLET | Freq: Four times a day (QID) | ORAL | Status: DC

## 2014-09-14 NOTE — Assessment & Plan Note (Signed)
hgba1c unacceptable but improving, minimize simple carbs. Increase exercise as tolerated. Continue current meds 

## 2014-09-14 NOTE — Progress Notes (Signed)
Pre visit review using our clinic review tool, if applicable. No additional management support is needed unless otherwise documented below in the visit note. 

## 2014-09-14 NOTE — Patient Instructions (Signed)

## 2014-09-14 NOTE — Telephone Encounter (Signed)
Dr. Delton Coombes, please advise regarding sleep study results thanks

## 2014-09-14 NOTE — Progress Notes (Signed)
Patient ID: Stephanie Cordova, female   DOB: 03-21-1955, 59 y.o.   MRN: [id number]   Subjective:    Patient ID: Stephanie Cordova, female    DOB: 11-Jan-1956, 59 y.o.   MRN: [id number]  Chief Complaint  Patient presents with  . Follow-up    2 month    HPI Patient is in today for follow up on numerous concerns. No recent illness. She continues to struggle with fatigue and restless sleep. Is awaiting the results of her recent sleep study. No new concerns or illness. Denies CP/palp/SOB/HA/congestion/fevers/GI or GU c/o. Taking meds as prescribed  Past Medical History  Diagnosis Date  . Hypertension   . Personal history of colonic polyps   . Insomnia   . Obesity   . Hot flash, menopausal   . Arthritis     b/l knees, bone on bone  . Allergy     seaonal and greens  . Preventative health care 11/30/2012  . Personal history of colonic polyps 11/30/2012    Sees Dr Oletta Lamas at [place]  . Insomnia 11/30/2012  . HTN (hypertension) 11/30/2012  . Hyperglycemia 12/21/2012  . Fatty infiltration of liver 12/21/2012  . Diabetes mellitus type 2 in obese 12/21/2012    Past Surgical History  Procedure Laterality Date  . Abdominal hysterectomy  1996  . Tonsillectomy  1968  . Knee arthroscopy Right 1996 and 2007  . Tubal ligation      Family History  Problem Relation Age of Onset  . Hypertension Mother   . Hyperlipidemia Mother   . Leukemia Mother   . Cancer Mother   . Hypertension Father   . Hyperlipidemia Father   . Diabetes Father   . Heart disease Father   . Alzheimer's disease Father   . Multiple sclerosis Son   . Heart disease Maternal Grandfather     Social History   Social History  . Marital Status: Widowed    Spouse Name: N/A  . Number of Children: N/A  . Years of Education: N/A   Occupational History  . Customer service    Social History Main Topics  . Smoking status: Never Smoker   . Smokeless tobacco: Never Used  . Alcohol Use: No  . Drug Use: No  . Sexual Activity: No   Comment: lives alone, widowed in 2006   Other Topics Concern  . Not on file   Social History Narrative    Outpatient Prescriptions Prior to Visit  Medication Sig Dispense Refill  . Blood Glucose Monitoring Suppl (ONE TOUCH ULTRA SYSTEM KIT) W/DEVICE KIT Use as directed twice daily to check blood sugar.  DX E11.9 1 each 0  . glucose blood test strip Use as instructed 100 each 6  . Lancets MISC Check blood sugar twice daily. DX code E11.9 100 each 6  . triamterene-hydrochlorothiazide (MAXZIDE-25) 37.5-25 MG per tablet Take 1 tablet by mouth daily. 90 tablet 3  . lisinopril (PRINIVIL,ZESTRIL) 5 MG tablet Take 1 tablet (5 mg total) by mouth daily. 90 tablet 3  . metFORMIN (GLUCOPHAGE) 500 MG tablet TAKE 2 TABLETS BY MOUTH TWICE DAILY WITH A MEAL 120 tablet 0  . metoprolol (LOPRESSOR) 100 MG tablet Take 1 tablet (100 mg total) by mouth 2 (two) times daily. 60 tablet 3  . metformin (FORTAMET) 1000 MG (OSM) 24 hr tablet Take 2 tablets (2,000 mg total) by mouth daily with breakfast. 60 tablet 1   No facility-administered medications prior to visit.    Allergies  Allergen Reactions  .  Losartan Palpitations  . Sulfa Antibiotics Rash    "Burning" rash    Review of Systems  Constitutional: Positive for malaise/fatigue. Negative for fever.  HENT: Negative for congestion.   Eyes: Negative for discharge.  Respiratory: Negative for shortness of breath.   Cardiovascular: Negative for chest pain, palpitations and leg swelling.  Gastrointestinal: Negative for nausea and abdominal pain.  Genitourinary: Negative for dysuria.  Musculoskeletal: Negative for falls.  Skin: Negative for rash.  Neurological: Negative for loss of consciousness and headaches.  Endo/Heme/Allergies: Negative for environmental allergies.  Psychiatric/Behavioral: Negative for depression. The patient has insomnia. The patient is not nervous/anxious.        Objective:    Physical Exam  Constitutional: She is oriented to  person, place, and time. She appears well-developed and well-nourished. No distress.  HENT:  Head: Normocephalic and atraumatic.  Nose: Nose normal.  Eyes: Right eye exhibits no discharge. Left eye exhibits no discharge.  Neck: Normal range of motion. Neck supple.  Cardiovascular: Normal rate and regular rhythm.   No murmur heard. Pulmonary/Chest: Effort normal and breath sounds normal.  Abdominal: Soft. Bowel sounds are normal. There is no tenderness.  Musculoskeletal: She exhibits no edema.  Neurological: She is alert and oriented to person, place, and time.  Skin: Skin is warm and dry.  Psychiatric: She has a normal mood and affect.  Nursing note and vitals reviewed.   BP 162/82 mmHg  Pulse 82  Temp(Src) 98.6 F (37 C) (Oral)  Ht 5' 2"  (1.575 m)  Wt 246 lb 6 oz (111.755 kg)  BMI 45.05 kg/m2  SpO2 95%  LMP 01/29/1994 Wt Readings from Last 3 Encounters:  09/14/14 246 lb 6 oz (111.755 kg)  08/25/14 249 lb (112.946 kg)  08/17/14 249 lb (112.946 kg)     Lab Results  Component Value Date   WBC 5.4 09/08/2014   HGB 13.0 09/08/2014   HCT 38.4 09/08/2014   PLT 129.0* 09/08/2014   GLUCOSE 388* 09/08/2014   CHOL 141 09/08/2014   TRIG 90.0 09/08/2014   HDL 56.50 09/08/2014   LDLCALC 66 09/08/2014   ALT 73* 09/08/2014   AST 45* 09/08/2014   NA 135 09/08/2014   K 4.1 09/08/2014   CL 101 09/08/2014   CREATININE 0.78 09/08/2014   BUN 14 09/08/2014   CO2 25 09/08/2014   TSH 1.69 09/08/2014   HGBA1C 8.5* 09/08/2014   MICROALBUR 0.7 09/08/2014    Lab Results  Component Value Date   TSH 1.69 09/08/2014   Lab Results  Component Value Date   WBC 5.4 09/08/2014   HGB 13.0 09/08/2014   HCT 38.4 09/08/2014   MCV 90.2 09/08/2014   PLT 129.0* 09/08/2014   Lab Results  Component Value Date   NA 135 09/08/2014   K 4.1 09/08/2014   CO2 25 09/08/2014   GLUCOSE 388* 09/08/2014   BUN 14 09/08/2014   CREATININE 0.78 09/08/2014   BILITOT 0.7 09/08/2014   ALKPHOS 79  09/08/2014   AST 45* 09/08/2014   ALT 73* 09/08/2014   PROT 6.2 09/08/2014   ALBUMIN 3.7 09/08/2014   CALCIUM 8.9 09/08/2014   GFR 80.50 09/08/2014   Lab Results  Component Value Date   CHOL 141 09/08/2014   Lab Results  Component Value Date   HDL 56.50 09/08/2014   Lab Results  Component Value Date   LDLCALC 66 09/08/2014   Lab Results  Component Value Date   TRIG 90.0 09/08/2014   Lab Results  Component Value Date  CHOLHDL 2 09/08/2014   Lab Results  Component Value Date   HGBA1C 8.5* 09/08/2014       Assessment & Plan:   Diabetes mellitus type 2 in obese hgba1c unacceptable but improving, minimize simple carbs. Increase exercise as tolerated. Continue current meds.   HTN (hypertension) Well controlled, no changes to meds. Encouraged heart healthy diet such as the DASH diet and exercise as tolerated.   Obesity Encouraged DASH diet, decrease po intake and increase exercise as tolerated. Needs 7-8 hours of sleep nightly. Avoid trans fats, eat small, frequent meals every 4-5 hours with lean proteins, complex carbs and healthy fats. Minimize simple carbs  Restless sleeper Hs been diagnosed with apnea, agrees to start CPAP  Hot flash, menopausal Encouraged small, frequent meals with lean proteins and complex carbs, need regular exercise and good hydration  Insomnia Encouraged good sleep hygiene such as dark, quiet room. No blue/green glowing lights such as computer screens in bedroom. No alcohol or stimulants in evening. Cut down on caffeine as able. Regular exercise is helpful but not just prior to bed time.    I have discontinued Ms. Pokorski's metoprolol, lisinopril, metformin, and metFORMIN. I am also having her start on metFORMIN, lisinopril, metoprolol, and glimepiride. Additionally, I am having her maintain her triamterene-hydrochlorothiazide, Lancets, glucose blood, and ONE TOUCH ULTRA SYSTEM KIT.  Meds ordered this encounter  Medications  . metFORMIN  (GLUMETZA) 500 MG (MOD) 24 hr tablet    Sig: Take 1 tablet (500 mg total) by mouth 4 (four) times daily. PLEASE DISPENSE GENERIC EQUIVALENT    Dispense:  120 tablet    Refill:  5  . lisinopril (PRINIVIL,ZESTRIL) 2.5 MG tablet    Sig: Take 1 tablet (2.5 mg total) by mouth daily.    Dispense:  30 tablet    Refill:  5  . metoprolol (LOPRESSOR) 100 MG tablet    Sig: Take 1.5 tablets (150 mg total) by mouth 2 (two) times daily.    Dispense:  90 tablet    Refill:  5  . glimepiride (AMARYL) 1 MG tablet    Sig: Take 1 tablet (1 mg total) by mouth daily with breakfast.    Dispense:  30 tablet    Refill:  2     Elizabeth Sauer, LPN

## 2014-09-16 NOTE — Telephone Encounter (Signed)
Please let the patient know that we need to start CPAP >> CPAP therapy on 10 cm H2O with a X-Small size Resmed Full Face Mask AirFit F10 for Her mask and heated humidification.   Also let Dr Rogelia Rohrer know that we are getting it started, thanks

## 2014-09-16 NOTE — Telephone Encounter (Signed)
lmomtcb x1 for pt Called spoke with Robin- Dr. Mariel Aloe nurse and made her aware as well

## 2014-09-16 NOTE — Telephone Encounter (Signed)
RB please advise. Thanks.  

## 2014-09-17 NOTE — Telephone Encounter (Signed)
Pt aware that we are ordering a CPAP machine for her and that DME will be contacting her in the next week to set up an appt. Nothing further needed.

## 2014-09-26 NOTE — Assessment & Plan Note (Signed)
Well controlled, no changes to meds. Encouraged heart healthy diet such as the DASH diet and exercise as tolerated.  °

## 2014-09-26 NOTE — Assessment & Plan Note (Signed)
Encouraged small, frequent meals with lean proteins and complex carbs, need regular exercise and good hydration

## 2014-09-26 NOTE — Assessment & Plan Note (Signed)
Encouraged good sleep hygiene such as dark, quiet room. No blue/green glowing lights such as computer screens in bedroom. No alcohol or stimulants in evening. Cut down on caffeine as able. Regular exercise is helpful but not just prior to bed time.  

## 2014-09-26 NOTE — Assessment & Plan Note (Signed)
Hs been diagnosed with apnea, agrees to start CPAP

## 2014-09-26 NOTE — Assessment & Plan Note (Signed)
Encouraged DASH diet, decrease po intake and increase exercise as tolerated. Needs 7-8 hours of sleep nightly. Avoid trans fats, eat small, frequent meals every 4-5 hours with lean proteins, complex carbs and healthy fats. Minimize simple carbs 

## 2014-10-15 ENCOUNTER — Encounter: Payer: Self-pay | Admitting: Family Medicine

## 2014-10-15 ENCOUNTER — Encounter: Payer: Self-pay | Admitting: Emergency Medicine

## 2014-10-19 ENCOUNTER — Telehealth: Payer: Self-pay

## 2014-10-19 ENCOUNTER — Ambulatory Visit: Payer: BLUE CROSS/BLUE SHIELD | Admitting: Dietician

## 2014-10-19 MED ORDER — GLIMEPIRIDE 1 MG PO TABS: 1.0000 mg | ORAL_TABLET | Freq: Two times a day (BID) | ORAL | Status: DC

## 2014-10-19 NOTE — Addendum Note (Signed)
Addended by: Scharlene Gloss B on: 10/19/2014 06:51 PM   Modules accepted: Orders

## 2014-10-19 NOTE — Telephone Encounter (Signed)
Pt notified and medication sent to pharmacy. No questions or concerns at this time.

## 2014-10-30 ENCOUNTER — Other Ambulatory Visit: Payer: Self-pay | Admitting: Family Medicine

## 2014-11-30 ENCOUNTER — Ambulatory Visit: Payer: BLUE CROSS/BLUE SHIELD | Admitting: Family Medicine

## 2014-12-14 ENCOUNTER — Other Ambulatory Visit: Payer: BLUE CROSS/BLUE SHIELD

## 2014-12-17 ENCOUNTER — Other Ambulatory Visit (INDEPENDENT_AMBULATORY_CARE_PROVIDER_SITE_OTHER): Payer: BLUE CROSS/BLUE SHIELD

## 2014-12-17 ENCOUNTER — Other Ambulatory Visit: Payer: Self-pay | Admitting: Family Medicine

## 2014-12-17 DIAGNOSIS — E119 Type 2 diabetes mellitus without complications: Secondary | ICD-10-CM | POA: Diagnosis not present

## 2014-12-17 DIAGNOSIS — E669 Obesity, unspecified: Secondary | ICD-10-CM

## 2014-12-17 DIAGNOSIS — I10 Essential (primary) hypertension: Secondary | ICD-10-CM

## 2014-12-17 DIAGNOSIS — E1169 Type 2 diabetes mellitus with other specified complication: Secondary | ICD-10-CM

## 2014-12-17 LAB — CBC
HCT: 42.5 % (ref 36.0–46.0)
Hemoglobin: 14.1 g/dL (ref 12.0–15.0)
MCHC: 33.2 g/dL (ref 30.0–36.0)
MCV: 89.3 fl (ref 78.0–100.0)
Platelets: 168 10*3/uL (ref 150.0–400.0)
RBC: 4.77 Mil/uL (ref 3.87–5.11)
RDW: 13.8 % (ref 11.5–15.5)
WBC: 7.5 10*3/uL (ref 4.0–10.5)

## 2014-12-17 LAB — COMPREHENSIVE METABOLIC PANEL
ALK PHOS: 74 U/L (ref 39–117)
ALT: 31 U/L (ref 0–35)
AST: 31 U/L (ref 0–37)
Albumin: 3.9 g/dL (ref 3.5–5.2)
BILIRUBIN TOTAL: 0.8 mg/dL (ref 0.2–1.2)
BUN: 11 mg/dL (ref 6–23)
CALCIUM: 9.3 mg/dL (ref 8.4–10.5)
CO2: 27 meq/L (ref 19–32)
Chloride: 102 mEq/L (ref 96–112)
Creatinine, Ser: 0.86 mg/dL (ref 0.40–1.20)
GFR: 71.85 mL/min (ref >60.00)
Glucose, Bld: 210 mg/dL — ABNORMAL HIGH (ref 70–99)
POTASSIUM: 4.1 meq/L (ref 3.5–5.1)
SODIUM: 138 meq/L (ref 135–145)
TOTAL PROTEIN: 6.6 g/dL (ref 6.0–8.3)

## 2014-12-17 LAB — HEMOGLOBIN A1C: Hgb A1c MFr Bld: 8.1 % — ABNORMAL HIGH (ref 4.6–6.5)

## 2014-12-17 LAB — LIPID PANEL
CHOLESTEROL: 157 mg/dL (ref 0–200)
HDL: 58.6 mg/dL (ref >39.00)
LDL CALC: 80 mg/dL (ref 0–99)
NonHDL: 98.16
TRIGLYCERIDES: 89 mg/dL (ref 0.0–149.0)
Total CHOL/HDL Ratio: 3
VLDL: 17.8 mg/dL (ref 0.0–40.0)

## 2014-12-17 LAB — TSH: TSH: 2.45 u[IU]/mL (ref 0.35–4.50)

## 2014-12-17 MED ORDER — GLIMEPIRIDE 2 MG PO TABS: 2.0000 mg | ORAL_TABLET | Freq: Two times a day (BID) | ORAL | Status: DC

## 2014-12-20 ENCOUNTER — Other Ambulatory Visit: Payer: Self-pay | Admitting: Family Medicine

## 2014-12-20 NOTE — Telephone Encounter (Signed)
Updated medication list

## 2014-12-21 ENCOUNTER — Encounter: Payer: Self-pay | Admitting: Family Medicine

## 2014-12-21 ENCOUNTER — Ambulatory Visit (INDEPENDENT_AMBULATORY_CARE_PROVIDER_SITE_OTHER): Payer: BLUE CROSS/BLUE SHIELD | Admitting: Family Medicine

## 2014-12-21 VITALS — BP 144/90 | HR 86 | Temp 98.2°F | Ht 62.0 in | Wt 250.1 lb

## 2014-12-21 DIAGNOSIS — E669 Obesity, unspecified: Secondary | ICD-10-CM

## 2014-12-21 DIAGNOSIS — E119 Type 2 diabetes mellitus without complications: Secondary | ICD-10-CM

## 2014-12-21 DIAGNOSIS — E1165 Type 2 diabetes mellitus with hyperglycemia: Secondary | ICD-10-CM | POA: Diagnosis not present

## 2014-12-21 DIAGNOSIS — I10 Essential (primary) hypertension: Secondary | ICD-10-CM | POA: Diagnosis not present

## 2014-12-21 DIAGNOSIS — E782 Mixed hyperlipidemia: Secondary | ICD-10-CM | POA: Diagnosis not present

## 2014-12-21 DIAGNOSIS — J209 Acute bronchitis, unspecified: Secondary | ICD-10-CM

## 2014-12-21 DIAGNOSIS — E1169 Type 2 diabetes mellitus with other specified complication: Secondary | ICD-10-CM

## 2014-12-21 DIAGNOSIS — G4700 Insomnia, unspecified: Secondary | ICD-10-CM | POA: Diagnosis not present

## 2014-12-21 MED ORDER — BENZONATATE 100 MG PO CAPS: 100.0000 mg | ORAL_CAPSULE | Freq: Three times a day (TID) | ORAL | Status: DC | PRN

## 2014-12-21 MED ORDER — LISINOPRIL 5 MG PO TABS: 5.0000 mg | ORAL_TABLET | Freq: Two times a day (BID) | ORAL | Status: DC

## 2014-12-21 MED ORDER — AMOXICILLIN 500 MG PO CAPS: 500.0000 mg | ORAL_CAPSULE | Freq: Three times a day (TID) | ORAL | Status: DC

## 2014-12-21 MED ORDER — METOPROLOL TARTRATE 25 MG PO TABS: 25.0000 mg | ORAL_TABLET | Freq: Two times a day (BID) | ORAL | Status: DC

## 2014-12-21 MED ORDER — ESCITALOPRAM OXALATE 10 MG PO TABS: 10.0000 mg | ORAL_TABLET | Freq: Every day | ORAL | Status: DC

## 2014-12-21 NOTE — Progress Notes (Signed)
Pre visit review using our clinic review tool, if applicable. No additional management support is needed unless otherwise documented below in the visit note. 

## 2014-12-21 NOTE — Patient Instructions (Addendum)
Probiotic daily, Digestive Advantatge or [employer] Colon Health  Or online NOW company makes a 10 strain cap daily, [email or web address] Plain twice a day, no DM or D  Basic Carbohydrate Counting for Diabetes Mellitus Carbohydrate counting is a method for keeping track of the amount of carbohydrates you eat. Eating carbohydrates naturally increases the level of sugar (glucose) in your blood, so it is important for you to know the amount that is okay for you to have in every meal. Carbohydrate counting helps keep the level of glucose in your blood within normal limits. The amount of carbohydrates allowed is different for every person. A dietitian can help you calculate the amount that is right for you. Once you know the amount of carbohydrates you can have, you can count the carbohydrates in the foods you want to eat. Carbohydrates are found in the following foods:  Grains, such as breads and cereals.  Dried beans and soy products.  Starchy vegetables, such as potatoes, peas, and corn.  Fruit and fruit juices.  Milk and yogurt.  Sweets and snack foods, such as cake, cookies, candy, chips, soft drinks, and fruit drinks. CARBOHYDRATE COUNTING There are two ways to count the carbohydrates in your food. You can use either of the methods or a combination of both. Reading the "Nutrition Facts" on Packaged Food The "Nutrition Facts" is an area that is included on the labels of almost all packaged food and beverages in the [place]. It includes the serving size of that food or beverage and information about the nutrients in each serving of the food, including the grams (g) of carbohydrate per serving.  Decide the number of servings of this food or beverage that you will be able to eat or drink. Multiply that number of servings by the number of grams of carbohydrate that is listed on the label for that serving. The total will be the amount of carbohydrates you will be having when you eat or drink this  food or beverage. Learning Standard Serving Sizes of Food When you eat food that is not packaged or does not include "Nutrition Facts" on the label, you need to measure the servings in order to count the amount of carbohydrates.A serving of most carbohydrate-rich foods contains about 15 g of carbohydrates. The following list includes serving sizes of carbohydrate-rich foods that provide 15 g ofcarbohydrate per serving:   1 slice of bread (1 oz) or 1 six-inch tortilla.    of a hamburger bun or English muffin.  4-6 crackers.   cup unsweetened dry cereal.    cup hot cereal.   cup rice or pasta.    cup mashed potatoes or  of a large baked potato.  1 cup fresh fruit or one small piece of fruit.    cup canned or frozen fruit or fruit juice.  1 cup milk.   cup plain fat-free yogurt or yogurt sweetened with artificial sweeteners.   cup cooked dried beans or starchy vegetable, such as peas, corn, or potatoes.  Decide the number of standard-size servings that you will eat. Multiply that number of servings by 15 (the grams of carbohydrates in that serving). For example, if you eat 2 cups of strawberries, you will have eaten 2 servings and 30 g of carbohydrates (2 servings x 15 g = 30 g). For foods such as soups and casseroles, in which more than one food is mixed in, you will need to count the carbohydrates in each food that is included. EXAMPLE OF CARBOHYDRATE  COUNTING Sample Dinner  3 oz chicken breast.   cup of brown rice.   cup of corn.  1 cup milk.   1 cup strawberries with sugar-free whipped topping.  Carbohydrate Calculation Step 1: Identify the foods that contain carbohydrates:   Rice.   Corn.   Milk.   Strawberries. Step 2:Calculate the number of servings eaten of each:   2 servings of rice.   1 serving of corn.   1 serving of milk.   1 serving of strawberries. Step 3: Multiply each of those number of servings by 15 g:   2  servings of rice x 15 g = 30 g.   1 serving of corn x 15 g = 15 g.   1 serving of milk x 15 g = 15 g.   1 serving of strawberries x 15 g = 15 g. Step 4: Add together all of the amounts to find the total grams of carbohydrates eaten: 30 g + 15 g + 15 g + 15 g = 75 g.   This information is not intended to replace advice given to you by your health care provider. Make sure you discuss any questions you have with your health care provider.   Document Released: 01/15/2005 Document Revised: 02/05/2014 Document Reviewed: 12/12/2012 Elsevier Interactive Patient Education [employer]

## 2014-12-26 ENCOUNTER — Encounter: Payer: Self-pay | Admitting: Family Medicine

## 2014-12-26 DIAGNOSIS — J209 Acute bronchitis, unspecified: Secondary | ICD-10-CM | POA: Insufficient documentation

## 2014-12-26 NOTE — Assessment & Plan Note (Signed)
Encouraged good sleep hygiene such as dark, quiet room. No blue/green glowing lights such as computer screens in bedroom. No alcohol or stimulants in evening. Cut down on caffeine as able. Regular exercise is helpful but not just prior to bed time.  

## 2014-12-26 NOTE — Assessment & Plan Note (Signed)
Encouraged DASH diet, decrease po intake and increase exercise as tolerated. Needs 7-8 hours of sleep nightly. Avoid trans fats, eat small, frequent meals every 4-5 hours with lean proteins, complex carbs and healthy fats. Minimize simple carbs, GMO foods. 

## 2014-12-26 NOTE — Assessment & Plan Note (Signed)
Started on Amoxicillin and probiotics, continue mucinex

## 2014-12-26 NOTE — Progress Notes (Signed)
Subjective:    Patient ID: Stephanie Cordova, female    DOB: 06-01-1955, 59 y.o.   MRN: [id number]  Chief Complaint  Patient presents with  . Follow-up    HPI Patient is in today for follow up but has been struggling with 9 days of worsening congestion. Cough is productive of green phlegm. No fevers, chills, is using Mucinex DM with marginal improvement. She had [place] at home and took it foe 4 days which she thinks was helpful but now symptoms are worsening again. Denies CP/palp/SOB/HA/congestion/fevers/GI or GU c/o. Taking meds as prescribed  Past Medical History  Diagnosis Date  . Hypertension   . Personal history of colonic polyps   . Insomnia   . Obesity   . Hot flash, menopausal   . Arthritis     b/l knees, bone on bone  . Allergy     seaonal and greens  . Preventative health care 11/30/2012  . Personal history of colonic polyps 11/30/2012    Sees Dr Oletta Lamas at [place]  . Insomnia 11/30/2012  . HTN (hypertension) 11/30/2012  . Hyperglycemia 12/21/2012  . Fatty infiltration of liver 12/21/2012  . Diabetes mellitus type 2 in obese ([place]) 12/21/2012  . Acute bronchitis 12/26/2014    Past Surgical History  Procedure Laterality Date  . Abdominal hysterectomy  1996  . Tonsillectomy  1968  . Knee arthroscopy Right 1996 and 2007  . Tubal ligation      Family History  Problem Relation Age of Onset  . Hypertension Mother   . Hyperlipidemia Mother   . Leukemia Mother   . Cancer Mother   . Hypertension Father   . Hyperlipidemia Father   . Diabetes Father   . Heart disease Father   . Alzheimer's disease Father   . Multiple sclerosis Son   . Heart disease Maternal Grandfather     Social History   Social History  . Marital Status: Widowed    Spouse Name: N/A  . Number of Children: N/A  . Years of Education: N/A   Occupational History  . Customer service    Social History Main Topics  . Smoking status: Never Smoker   . Smokeless tobacco: Never Used  . Alcohol Use: No    . Drug Use: No  . Sexual Activity: No     Comment: lives alone, widowed in 2006   Other Topics Concern  . Not on file   Social History Narrative    Outpatient Prescriptions Prior to Visit  Medication Sig Dispense Refill  . Blood Glucose Monitoring Suppl (ONE TOUCH ULTRA SYSTEM KIT) W/DEVICE KIT Use as directed twice daily to check blood sugar.  DX E11.9 1 each 0  . glimepiride (AMARYL) 2 MG tablet Take 1 tablet (2 mg total) by mouth 2 (two) times daily. 60 tablet 3  . glucose blood test strip Use as instructed 100 each 6  . Lancets MISC Check blood sugar twice daily. DX code E11.9 100 each 6  . lisinopril (PRINIVIL,ZESTRIL) 2.5 MG tablet Take 1 tablet (2.5 mg total) by mouth daily. 30 tablet 5  . triamterene-hydrochlorothiazide (MAXZIDE-25) 37.5-25 MG per tablet Take 1 tablet by mouth daily. 90 tablet 3  . metoprolol (LOPRESSOR) 100 MG tablet Take 1.5 tablets (150 mg total) by mouth 2 (two) times daily. 90 tablet 5   No facility-administered medications prior to visit.    Allergies  Allergen Reactions  . Losartan Palpitations  . Sulfa Antibiotics Rash    "Burning" rash  Review of Systems  Constitutional: Negative for fever and malaise/fatigue.  HENT: Positive for congestion.   Eyes: Negative for discharge.  Respiratory: Positive for cough and sputum production. Negative for shortness of breath.   Cardiovascular: Negative for chest pain, palpitations and leg swelling.  Gastrointestinal: Negative for nausea and abdominal pain.  Genitourinary: Negative for dysuria.  Musculoskeletal: Negative for falls.  Skin: Negative for rash.  Neurological: Negative for loss of consciousness and headaches.  Endo/Heme/Allergies: Negative for environmental allergies.  Psychiatric/Behavioral: Negative for depression. The patient is not nervous/anxious.        Objective:    Physical Exam  Constitutional: She is oriented to person, place, and time. She appears well-developed and  well-nourished. No distress.  HENT:  Head: Normocephalic and atraumatic.  Nose: Nose normal.  Eyes: Right eye exhibits no discharge. Left eye exhibits no discharge.  Neck: Normal range of motion. Neck supple.  Cardiovascular: Normal rate and regular rhythm.   No murmur heard. Pulmonary/Chest: Effort normal. She has rales.  Abdominal: Soft. Bowel sounds are normal. There is no tenderness.  Musculoskeletal: She exhibits no edema.  Neurological: She is alert and oriented to person, place, and time.  Skin: Skin is warm and dry.  Psychiatric: She has a normal mood and affect.  Nursing note and vitals reviewed.   BP 144/90 mmHg  Pulse 86  Temp(Src) 98.2 F (36.8 C) (Oral)  Ht 5' 2" (1.575 m)  Wt 250 lb 2 oz (113.456 kg)  BMI 45.74 kg/m2  SpO2 96%  LMP 01/29/1994 Wt Readings from Last 3 Encounters:  12/21/14 250 lb 2 oz (113.456 kg)  09/14/14 246 lb 6 oz (111.755 kg)  08/25/14 249 lb (112.946 kg)     Lab Results  Component Value Date   WBC 7.5 12/17/2014   HGB 14.1 12/17/2014   HCT 42.5 12/17/2014   PLT 168.0 12/17/2014   GLUCOSE 210* 12/17/2014   CHOL 157 12/17/2014   TRIG 89.0 12/17/2014   HDL 58.60 12/17/2014   LDLCALC 80 12/17/2014   ALT 31 12/17/2014   AST 31 12/17/2014   NA 138 12/17/2014   K 4.1 12/17/2014   CL 102 12/17/2014   CREATININE 0.86 12/17/2014   BUN 11 12/17/2014   CO2 27 12/17/2014   TSH 2.45 12/17/2014   HGBA1C 8.1* 12/17/2014   MICROALBUR 0.7 09/08/2014    Lab Results  Component Value Date   TSH 2.45 12/17/2014   Lab Results  Component Value Date   WBC 7.5 12/17/2014   HGB 14.1 12/17/2014   HCT 42.5 12/17/2014   MCV 89.3 12/17/2014   PLT 168.0 12/17/2014   Lab Results  Component Value Date   NA 138 12/17/2014   K 4.1 12/17/2014   CO2 27 12/17/2014   GLUCOSE 210* 12/17/2014   BUN 11 12/17/2014   CREATININE 0.86 12/17/2014   BILITOT 0.8 12/17/2014   ALKPHOS 74 12/17/2014   AST 31 12/17/2014   ALT 31 12/17/2014   PROT 6.6  12/17/2014   ALBUMIN 3.9 12/17/2014   CALCIUM 9.3 12/17/2014   GFR 71.85 12/17/2014   Lab Results  Component Value Date   CHOL 157 12/17/2014   Lab Results  Component Value Date   HDL 58.60 12/17/2014   Lab Results  Component Value Date   LDLCALC 80 12/17/2014   Lab Results  Component Value Date   TRIG 89.0 12/17/2014   Lab Results  Component Value Date   CHOLHDL 3 12/17/2014   Lab Results  Component Value Date  HGBA1C 8.1* 12/17/2014       Assessment & Plan:   Problem List Items Addressed This Visit    Acute bronchitis    Started on Amoxicillin and probiotics, continue mucinex       Diabetes mellitus type 2 in obese ([place])    hgba1c acceptable, minimize simple carbs. Increase exercise as tolerated. Continue current meds      Relevant Medications   lisinopril (PRINIVIL,ZESTRIL) 5 MG tablet   HTN (hypertension)    poorly controlled, increase Lisinopril to bid. Encouraged heart healthy diet such as the DASH diet and exercise as tolerated.       Relevant Medications   lisinopril (PRINIVIL,ZESTRIL) 5 MG tablet   metoprolol tartrate (LOPRESSOR) 25 MG tablet   Other Relevant Orders   CBC   TSH   Lipid panel   Comprehensive metabolic panel   Hemoglobin A1c   Microalbumin / creatinine urine ratio   Insomnia    Encouraged good sleep hygiene such as dark, quiet room. No blue/green glowing lights such as computer screens in bedroom. No alcohol or stimulants in evening. Cut down on caffeine as able. Regular exercise is helpful but not just prior to bed time.       Obesity    Encouraged DASH diet, decrease po intake and increase exercise as tolerated. Needs 7-8 hours of sleep nightly. Avoid trans fats, eat small, frequent meals every 4-5 hours with lean proteins, complex carbs and healthy fats. Minimize simple carbs, GMO foods.       Other Visit Diagnoses    Type 2 diabetes mellitus with hyperglycemia, without long-term current use of insulin (HCC)    -  Primary     Relevant Medications    lisinopril (PRINIVIL,ZESTRIL) 5 MG tablet    Other Relevant Orders    Ambulatory referral to Ophthalmology    CBC    TSH    Lipid panel    Comprehensive metabolic panel    Hemoglobin A1c    Microalbumin / creatinine urine ratio    Hyperlipidemia, mixed        Relevant Medications    lisinopril (PRINIVIL,ZESTRIL) 5 MG tablet    metoprolol tartrate (LOPRESSOR) 25 MG tablet    Other Relevant Orders    CBC    TSH    Lipid panel    Comprehensive metabolic panel    Hemoglobin A1c    Microalbumin / creatinine urine ratio       I have discontinued Ms. Lomeli's metoprolol. I am also having her start on amoxicillin, benzonatate, escitalopram, lisinopril, and metoprolol tartrate. Additionally, I am having her maintain her triamterene-hydrochlorothiazide, Lancets, glucose blood, ONE TOUCH ULTRA SYSTEM KIT, lisinopril, and glimepiride.  Meds ordered this encounter  Medications  . amoxicillin (AMOXIL) 500 MG capsule    Sig: Take 1 capsule (500 mg total) by mouth 3 (three) times daily.    Dispense:  30 capsule    Refill:  0  . benzonatate (TESSALON) 100 MG capsule    Sig: Take 1 capsule (100 mg total) by mouth 3 (three) times daily as needed for cough.    Dispense:  2030 capsule    Refill:  0  . escitalopram (LEXAPRO) 10 MG tablet    Sig: Take 1 tablet (10 mg total) by mouth daily.    Dispense:  30 tablet    Refill:  3  . lisinopril (PRINIVIL,ZESTRIL) 5 MG tablet    Sig: Take 1 tablet (5 mg total) by mouth 2 (two) times daily.  Dispense:  60 tablet    Refill:  3  . metoprolol tartrate (LOPRESSOR) 25 MG tablet    Sig: Take 1 tablet (25 mg total) by mouth 2 (two) times daily.    Dispense:  60 tablet    Refill:  3     Penni Homans, MD

## 2014-12-26 NOTE — Assessment & Plan Note (Addendum)
poorly controlled, increase Lisinopril to bid. Encouraged heart healthy diet such as the DASH diet and exercise as tolerated.

## 2014-12-26 NOTE — Assessment & Plan Note (Signed)
hgba1c acceptable, minimize simple carbs. Increase exercise as tolerated. Continue current meds 

## 2015-01-25 LAB — HM DIABETES EYE EXAM

## 2015-02-04 ENCOUNTER — Encounter: Payer: Self-pay | Admitting: Family Medicine

## 2015-03-17 ENCOUNTER — Other Ambulatory Visit: Payer: Self-pay

## 2015-03-22 ENCOUNTER — Ambulatory Visit: Payer: BLUE CROSS/BLUE SHIELD | Admitting: Family Medicine

## 2015-03-30 DIAGNOSIS — Z8709 Personal history of other diseases of the respiratory system: Secondary | ICD-10-CM

## 2015-03-30 HISTORY — DX: Personal history of other diseases of the respiratory system: Z87.09

## 2015-04-18 ENCOUNTER — Encounter: Payer: Self-pay | Admitting: Family

## 2015-04-18 ENCOUNTER — Ambulatory Visit (INDEPENDENT_AMBULATORY_CARE_PROVIDER_SITE_OTHER): Payer: 59 | Admitting: Family

## 2015-04-18 VITALS — BP 125/70 | HR 103 | Temp 99.3°F | Resp 16 | Ht 62.0 in | Wt 259.4 lb

## 2015-04-18 DIAGNOSIS — J209 Acute bronchitis, unspecified: Secondary | ICD-10-CM | POA: Diagnosis not present

## 2015-04-18 DIAGNOSIS — J029 Acute pharyngitis, unspecified: Secondary | ICD-10-CM

## 2015-04-18 LAB — POCT RAPID STREP A (OFFICE): Rapid Strep A Screen: NEGATIVE

## 2015-04-18 MED ORDER — HYDROCOD POLST-CPM POLST ER 10-8 MG/5ML PO SUER
5.0000 mL | Freq: Every evening | ORAL | Status: DC | PRN
Start: 2015-04-18 — End: 2015-05-03

## 2015-04-18 MED ORDER — AZITHROMYCIN 250 MG PO TABS: ORAL_TABLET | ORAL | Status: DC

## 2015-04-18 NOTE — Progress Notes (Signed)
Pre visit review using our clinic review tool, if applicable. No additional management support is needed unless otherwise documented below in the visit note. 

## 2015-04-18 NOTE — Patient Instructions (Signed)
Use delsym (OTC) once daily in AM as needed for cough. Use Tussionex 1 teaspoon at bedtime as needed for cough. Begin zpak (antiobiotic) for bronchitis. Call if symptoms worsen, if fever >101, new symptoms or if not improved in 3 days.

## 2015-04-18 NOTE — Progress Notes (Signed)
Subjective:    Patient ID: Stephanie Cordova, female    DOB: 1955/06/20, 60 y.o.   MRN: [id number]  HPI  Stephanie Cordova is a 60 yr old female who presents today with chief complaint of sore throat. Throat has been sore x 1 week. Reports + cough and Right ear feels "stopped up." Using tessalon pearles without improvement.  Cough is "bad," was originally productive but not currently productive.    Review of Systems  Constitutional: Negative for fever.  HENT: Positive for ear pain and rhinorrhea.   Respiratory: Positive for cough.       see HPI  Past Medical History  Diagnosis Date  . Hypertension   . Personal history of colonic polyps   . Insomnia   . Obesity   . Hot flash, menopausal   . Arthritis     b/l knees, bone on bone  . Allergy     seaonal and greens  . Preventative health care 11/30/2012  . Personal history of colonic polyps 11/30/2012    Sees Dr Oletta Lamas at [place]  . Insomnia 11/30/2012  . HTN (hypertension) 11/30/2012  . Hyperglycemia 12/21/2012  . Fatty infiltration of liver 12/21/2012  . Diabetes mellitus type 2 in obese ([place]) 12/21/2012  . Acute bronchitis 12/26/2014    Social History   Social History  . Marital Status: Widowed    Spouse Name: N/A  . Number of Children: N/A  . Years of Education: N/A   Occupational History  . Customer service    Social History Main Topics  . Smoking status: Never Smoker   . Smokeless tobacco: Never Used  . Alcohol Use: No  . Drug Use: No  . Sexual Activity: No     Comment: lives alone, widowed in 2006   Other Topics Concern  . Not on file   Social History Narrative    Past Surgical History  Procedure Laterality Date  . Abdominal hysterectomy  1996  . Tonsillectomy  1968  . Knee arthroscopy Right 1996 and 2007  . Tubal ligation      Family History  Problem Relation Age of Onset  . Hypertension Mother   . Hyperlipidemia Mother   . Leukemia Mother   . Cancer Mother   . Hypertension Father   . Hyperlipidemia  Father   . Diabetes Father   . Heart disease Father   . Alzheimer's disease Father   . Multiple sclerosis Son   . Heart disease Maternal Grandfather     Allergies  Allergen Reactions  . Losartan Palpitations  . Lexapro [Escitalopram] Other (See Comments)    swelling  . Sulfa Antibiotics Rash    "Burning" rash    Current Outpatient Prescriptions on File Prior to Visit  Medication Sig Dispense Refill  . Blood Glucose Monitoring Suppl (ONE TOUCH ULTRA SYSTEM KIT) W/DEVICE KIT Use as directed twice daily to check blood sugar.  DX E11.9 1 each 0  . glimepiride (AMARYL) 2 MG tablet Take 1 tablet (2 mg total) by mouth 2 (two) times daily. 60 tablet 3  . glucose blood test strip Use as instructed 100 each 6  . Lancets MISC Check blood sugar twice daily. DX code E11.9 100 each 6  . metoprolol tartrate (LOPRESSOR) 25 MG tablet Take 1 tablet (25 mg total) by mouth 2 (two) times daily. 60 tablet 3  . triamterene-hydrochlorothiazide (MAXZIDE-25) 37.5-25 MG per tablet Take 1 tablet by mouth daily. 90 tablet 3  . lisinopril (PRINIVIL,ZESTRIL) 2.5 MG tablet Take 1  tablet (2.5 mg total) by mouth daily. (Patient not taking: Reported on 04/18/2015) 30 tablet 5  . lisinopril (PRINIVIL,ZESTRIL) 5 MG tablet Take 1 tablet (5 mg total) by mouth 2 (two) times daily. 60 tablet 3   No current facility-administered medications on file prior to visit.    BP 125/70 mmHg  Pulse 103  Temp(Src) 99.3 F (37.4 C) (Oral)  Resp 16  Ht 5' 2"  (1.575 m)  Wt 259 lb 6.4 oz (117.663 kg)  BMI 47.43 kg/m2  SpO2 98%  LMP 01/29/1994    Objective:   Physical Exam  Constitutional: She is oriented to person, place, and time. She appears well-developed and well-nourished.  HENT:  Right Ear: External ear normal. Tympanic membrane is scarred. Tympanic membrane is not erythematous.  Left Ear: External ear normal. Tympanic membrane is scarred. Tympanic membrane is not erythematous.  Mouth/Throat: Posterior oropharyngeal  erythema present. No oropharyngeal exudate or posterior oropharyngeal edema.  Eyes: Conjunctivae are normal.  Neck: Neck supple.  Cardiovascular: Normal rate and regular rhythm.   Murmur heard. Pulmonary/Chest: Effort normal and breath sounds normal. No respiratory distress. She has no wheezes. She has no rales.  Lymphadenopathy:    She has no cervical adenopathy.  Neurological: She is alert and oriented to person, place, and time.  Skin: Skin is warm and dry.  Psychiatric: She has a normal mood and affect. Her behavior is normal. Judgment and thought content normal.          Assessment & Plan:  Acute bronchitis-  Low grade temp, mild elevation of HR.  Rapid strep negative.  Advised pt as follows:  Use delsym (OTC) once daily in AM as needed for cough. Use Tussionex 1 teaspoon at bedtime as needed for cough. Begin zpak (antiobiotic) for bronchitis. Call if symptoms worsen, if fever >101, new symptoms or if not improved in 3 days.

## 2015-04-20 ENCOUNTER — Encounter: Payer: Self-pay | Admitting: Family Medicine

## 2015-04-20 NOTE — Telephone Encounter (Signed)
Spoke with pt, she has already scheduled appt with PA, Saguier for tomorrow at 4:45pm and states she is unable to make an earlier appt.

## 2015-04-20 NOTE — Telephone Encounter (Signed)
Stephanie Cordova, can we please get pt in to be seen with someone first thing tomorrow?  Go to the ED if symptoms worsen overnight, continue zpak.

## 2015-04-20 NOTE — Telephone Encounter (Signed)
FW: Non-Urgent Medical Question     Waldon MerlWilliam C Martin, PA-C    Sent: Wed April 20, 2015 8:32 AM    To: Kathi Simpersricia A Kashon Kraynak, CMA        Message     Reviewing Dr. Mariel AloeBlyth's in-basket today. Forwarding to you as patient saw [name].

## 2015-04-21 ENCOUNTER — Ambulatory Visit (INDEPENDENT_AMBULATORY_CARE_PROVIDER_SITE_OTHER): Payer: 59 | Admitting: Medical

## 2015-04-21 ENCOUNTER — Encounter: Payer: Self-pay | Admitting: Medical

## 2015-04-21 VITALS — BP 132/88 | HR 102 | Temp 98.2°F | Ht 62.0 in | Wt 256.4 lb

## 2015-04-21 DIAGNOSIS — R05 Cough: Secondary | ICD-10-CM | POA: Diagnosis not present

## 2015-04-21 DIAGNOSIS — M940 Chondrocostal junction syndrome [Tietze]: Secondary | ICD-10-CM

## 2015-04-21 DIAGNOSIS — R062 Wheezing: Secondary | ICD-10-CM

## 2015-04-21 DIAGNOSIS — J209 Acute bronchitis, unspecified: Secondary | ICD-10-CM | POA: Diagnosis not present

## 2015-04-21 DIAGNOSIS — J040 Acute laryngitis: Secondary | ICD-10-CM | POA: Diagnosis not present

## 2015-04-21 DIAGNOSIS — R059 Cough, unspecified: Secondary | ICD-10-CM

## 2015-04-21 DIAGNOSIS — R195 Other fecal abnormalities: Secondary | ICD-10-CM

## 2015-04-21 MED ORDER — ALBUTEROL SULFATE HFA 108 (90 BASE) MCG/ACT IN AERS: 2.0000 | INHALATION_SPRAY | Freq: Four times a day (QID) | RESPIRATORY_TRACT | Status: DC | PRN

## 2015-04-21 MED ORDER — CEFTRIAXONE SODIUM 1 G IJ SOLR
1.0000 g | Freq: Once | INTRAMUSCULAR | Status: AC
  Administered 2015-04-21: 1 g via INTRAMUSCULAR

## 2015-04-21 NOTE — Patient Instructions (Signed)
For bronchitis. Continue zpack. Stop tussionex.  Use benzonatate 200 mg every 8 hours as needed for cough.  For wheezing if worsens albuterol inhaler.  For laryngitis rest voice. 1 day work note.  If loose stools worsen notify us. Use probiotic while on antibiotic.  For costochondritis use ibuprofen. If pain worsens as discussed then ED evaluation.   Follow up in 7 days or as needed

## 2015-04-21 NOTE — Progress Notes (Signed)
Pre visit review using our clinic review tool, if applicable. No additional management support is needed unless otherwise documented below in the visit note. 

## 2015-04-21 NOTE — Progress Notes (Signed)
Subjective:    Patient ID: Stephanie Cordova, female    DOB: [birth year], 60 y.o.   MRN: [id number]  HPI  Pt in for 2 weeks of feeling sick. Pt seen Monday by Dr. Charlett Blake. 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 She has one loose as day x 2 days. No severe loose stools. She states normal for her while on antibiotics.  Pt states on Monday treated for bronchitis. Given zpack and given tussionex. Tussionex made her hyper. Pt has benzonatate which she took before she was seen on Monday. But only has 100 mg tabs. She was using just one tab a day.  Pt not coughing up much mucous. Pt may have wheezed the other night. Faint chest wall pain coughing and breathing deep. If she does not cough or breath deep no chest wal pain. Pain is transient and last for seconds.  Review of Systems  Constitutional: Negative for fever, chills and fatigue.  HENT: Positive for congestion, sinus pressure and voice change. Negative for ear pain, postnasal drip and rhinorrhea.        Faint ear pressure rt side  Respiratory: Positive for cough and wheezing.   Cardiovascular: Negative for chest pain and palpitations.  Gastrointestinal: Negative for abdominal pain.  Musculoskeletal: Negative for back pain.       See hpi. Costochondritis type pain.  Neurological: Negative for dizziness and headaches.  Hematological: Negative for adenopathy. Does not bruise/bleed easily.  Psychiatric/Behavioral: Negative for behavioral problems and confusion.   Past Medical History  Diagnosis Date  . Hypertension   . Personal history of colonic polyps   . Insomnia   . Obesity   . Hot flash, menopausal   . Arthritis     b/l knees, bone on bone  . Allergy     seaonal and greens  . Preventative health care 11/30/2012  . Personal history of colonic polyps 11/30/2012    Sees Dr Oletta Lamas at [place]  . Insomnia 11/30/2012  . HTN (hypertension) 11/30/2012  . Hyperglycemia 12/21/2012  . Fatty infiltration of liver 12/21/2012    . Diabetes mellitus type 2 in obese ([place]) 12/21/2012  . Acute bronchitis 12/26/2014    Social History   Social History  . Marital Status: Widowed    Spouse Name: N/A  . Number of Children: N/A  . Years of Education: N/A   Occupational History  . Customer service    Social History Main Topics  . Smoking status: Never Smoker   . Smokeless tobacco: Never Used  . Alcohol Use: No  . Drug Use: No  . Sexual Activity: No     Comment: lives alone, widowed in 2006   Other Topics Concern  . Not on file   Social History Narrative    Past Surgical History  Procedure Laterality Date  . Abdominal hysterectomy  1996  . Tonsillectomy  1968  . Knee arthroscopy Right 1996 and 2007  . Tubal ligation      Family History  Problem Relation Age of Onset  . Hypertension Mother   . Hyperlipidemia Mother   . Leukemia Mother   . Cancer Mother   . Hypertension Father   . Hyperlipidemia Father   . Diabetes Father   . Heart disease Father   . Alzheimer's disease Father   . Multiple sclerosis Son   . Heart disease Maternal Grandfather     Allergies  Allergen Reactions  . Losartan Palpitations  . Lexapro [Escitalopram] Other (See Comments)  swelling  . Sulfa Antibiotics Rash    "Burning" rash    Current Outpatient Prescriptions on File Prior to Visit  Medication Sig Dispense Refill  . azithromycin (ZITHROMAX) 250 MG tablet 2 tabs by mouth today, then one tab by mouth daily for 4 more days 6 tablet 0  . Blood Glucose Monitoring Suppl (ONE TOUCH ULTRA SYSTEM KIT) W/DEVICE KIT Use as directed twice daily to check blood sugar.  DX E11.9 1 each 0  . chlorpheniramine-HYDROcodone (TUSSIONEX PENNKINETIC ER) 10-8 MG/5ML SUER Take 5 mLs by mouth at bedtime as needed for cough. 115 mL 0  . glimepiride (AMARYL) 2 MG tablet Take 1 tablet (2 mg total) by mouth 2 (two) times daily. 60 tablet 3  . glucose blood test strip Use as instructed 100 each 6  . Lancets MISC Check blood sugar twice  daily. DX code E11.9 100 each 6  . lisinopril (PRINIVIL,ZESTRIL) 2.5 MG tablet Take 1 tablet (2.5 mg total) by mouth daily. 30 tablet 5  . lisinopril (PRINIVIL,ZESTRIL) 5 MG tablet Take 1 tablet (5 mg total) by mouth 2 (two) times daily. 60 tablet 3  . metoprolol tartrate (LOPRESSOR) 25 MG tablet Take 1 tablet (25 mg total) by mouth 2 (two) times daily. 60 tablet 3  . triamterene-hydrochlorothiazide (MAXZIDE-25) 37.5-25 MG per tablet Take 1 tablet by mouth daily. 90 tablet 3   No current facility-administered medications on file prior to visit.    BP 132/88 mmHg  Pulse 102  Temp(Src) 98.2 F (36.8 C) (Oral)  Ht 5' 2"  (1.575 m)  Wt 256 lb 6.4 oz (116.302 kg)  BMI 46.88 kg/m2  SpO2 98%  LMP 01/29/1994       Objective:   Physical Exam  General  Mental Status - Alert. General Appearance - Well groomed. Not in acute distress.  Skin Rashes- No Rashes.  HEENT Head- Normal. Ear Auditory Canal - Left- Normal. Right - Normal.Tympanic Membrane- Left- Normal. Right- Normal. Eye Sclera/Conjunctiva- Left- Normal. Right- Normal. Nose & Sinuses Nasal Mucosa- Left-  Boggy and Congested. Right-  Boggy and  Congested.Bilateral faint rt side maxillary but no frontal sinus pressure. Mouth & Throat Lips: Upper Lip- Normal: no dryness, cracking, pallor, cyanosis, or vesicular eruption. Lower Lip-Normal: no dryness, cracking, pallor, cyanosis or vesicular eruption. Buccal Mucosa- Bilateral- No Aphthous ulcers. Oropharynx- No Discharge or Erythema. Tonsils: Characteristics- Bilateral- No Erythema or Congestion. Size/Enlargement- Bilateral- No enlargement. Discharge- bilateral-None.  Neck Neck- Supple. No Masses.   Chest and Lung Exam Auscultation: Breath Sounds:-Clear even and unlabored.  Cardiovascular Auscultation:Rythm- Regular, rate and rhythm. Murmurs & Other Heart Sounds:Ausculatation of the heart reveal- No Murmurs.  Lymphatic Head & Neck General Head & Neck  Lymphatics: Bilateral: Description- No Localized lymphadenopathy.  Anterior thorax- constochondral junction tender on palpation, coughing and deep inspiration. At rest no pain.       Assessment & Plan:  For bronchitis. Continue zpack. Stop tussionex.  Use benzonatate 200 mg every 8 hours as needed for cough. Rocephin 1 gram in office today.  For wheezing if worsens albuterol inhaler.  For laryngitis rest voice. 1 day work note.  If loose stools worsen notify us. Use probiotic while on antibiotic.  For costochondritis use ibuprofen. If pain worsens as discussed then ED evaluation.   Follow up in 7 days or as needed

## 2015-04-26 ENCOUNTER — Other Ambulatory Visit: Payer: Self-pay

## 2015-05-03 ENCOUNTER — Encounter: Payer: Self-pay | Admitting: Family Medicine

## 2015-05-03 ENCOUNTER — Ambulatory Visit (INDEPENDENT_AMBULATORY_CARE_PROVIDER_SITE_OTHER): Payer: 59 | Admitting: Family Medicine

## 2015-05-03 VITALS — BP 132/82 | HR 90 | Temp 98.6°F | Ht 62.0 in | Wt 258.2 lb

## 2015-05-03 DIAGNOSIS — E669 Obesity, unspecified: Secondary | ICD-10-CM | POA: Diagnosis not present

## 2015-05-03 DIAGNOSIS — Z1239 Encounter for other screening for malignant neoplasm of breast: Secondary | ICD-10-CM

## 2015-05-03 DIAGNOSIS — J209 Acute bronchitis, unspecified: Secondary | ICD-10-CM | POA: Diagnosis not present

## 2015-05-03 DIAGNOSIS — E1169 Type 2 diabetes mellitus with other specified complication: Secondary | ICD-10-CM

## 2015-05-03 DIAGNOSIS — E119 Type 2 diabetes mellitus without complications: Secondary | ICD-10-CM | POA: Diagnosis not present

## 2015-05-03 DIAGNOSIS — I10 Essential (primary) hypertension: Secondary | ICD-10-CM

## 2015-05-03 DIAGNOSIS — K760 Fatty (change of) liver, not elsewhere classified: Secondary | ICD-10-CM

## 2015-05-03 LAB — HEMOGLOBIN A1C: Hgb A1c MFr Bld: 8.7 % — ABNORMAL HIGH (ref 4.6–6.5)

## 2015-05-03 MED ORDER — CIPROFLOXACIN HCL 500 MG PO TABS: 500.0000 mg | ORAL_TABLET | Freq: Two times a day (BID) | ORAL | Status: DC

## 2015-05-03 NOTE — Assessment & Plan Note (Signed)
LFTs normalized on last blood draw. Minimize simple carbs

## 2015-05-03 NOTE — Progress Notes (Signed)
Pre visit review using our clinic review tool, if applicable. No additional management support is needed unless otherwise documented below in the visit note. 

## 2015-05-03 NOTE — Assessment & Plan Note (Signed)
hgba1c not acceptable, minimize simple carbs. Increase exercise as tolerated. Continue current meds, recheck today and adjust meds prn

## 2015-05-03 NOTE — Patient Instructions (Signed)
Probiotics daily, Elderberry liquid, zinc Coldeeze, vitamin c 500 mg, hydrate Mucinex twice daily  Acute Bronchitis Bronchitis is inflammation of the airways that extend from the windpipe into the lungs (bronchi). The inflammation often causes mucus to develop. This leads to a cough, which is the most common symptom of bronchitis.  In acute bronchitis, the condition usually develops suddenly and goes away over time, usually in a couple weeks. Smoking, allergies, and asthma can make bronchitis worse. Repeated episodes of bronchitis may cause further lung problems.  CAUSES Acute bronchitis is most often caused by the same virus that causes a cold. The virus can spread from person to person (contagious) through coughing, sneezing, and touching contaminated objects. SIGNS AND SYMPTOMS   Cough.   Fever.   Coughing up mucus.   Body aches.   Chest congestion.   Chills.   Shortness of breath.   Sore throat.  DIAGNOSIS  Acute bronchitis is usually diagnosed through a physical exam. Your health care provider will also ask you questions about your medical history. Tests, such as chest X-rays, are sometimes done to rule out other conditions.  TREATMENT  Acute bronchitis usually goes away in a couple weeks. Oftentimes, no medical treatment is necessary. Medicines are sometimes given for relief of fever or cough. Antibiotic medicines are usually not needed but may be prescribed in certain situations. In some cases, an inhaler may be recommended to help reduce shortness of breath and control the cough. A cool mist vaporizer may also be used to help thin bronchial secretions and make it easier to clear the chest.  HOME CARE INSTRUCTIONS  Get plenty of rest.   Drink enough fluids to keep your urine clear or pale yellow (unless you have a medical condition that requires fluid restriction). Increasing fluids may help thin your respiratory secretions (sputum) and reduce chest congestion, and it  will prevent dehydration.   Take medicines only as directed by your health care provider.  If you were prescribed an antibiotic medicine, finish it all even if you start to feel better.  Avoid smoking and secondhand smoke. Exposure to cigarette smoke or irritating chemicals will make bronchitis worse. If you are a smoker, consider using nicotine gum or skin patches to help control withdrawal symptoms. Quitting smoking will help your lungs heal faster.   Reduce the chances of another bout of acute bronchitis by washing your hands frequently, avoiding people with cold symptoms, and trying not to touch your hands to your mouth, nose, or eyes.   Keep all follow-up visits as directed by your health care provider.  SEEK MEDICAL CARE IF: Your symptoms do not improve after 1 week of treatment.  SEEK IMMEDIATE MEDICAL CARE IF:  You develop an increased fever or chills.   You have chest pain.   You have severe shortness of breath.  You have bloody sputum.   You develop dehydration.  You faint or repeatedly feel like you are going to pass out.  You develop repeated vomiting.  You develop a severe headache. MAKE SURE YOU:   Understand these instructions.  Will watch your condition.  Will get help right away if you are not doing well or get worse.   This information is not intended to replace advice given to you by your health care provider. Make sure you discuss any questions you have with your health care provider.   Document Released: 02/23/2004 Document Revised: 02/05/2014 Document Reviewed: 07/08/2012 Elsevier Interactive Patient Education [employer]2016 Elsevier Inc.

## 2015-05-03 NOTE — Assessment & Plan Note (Signed)
Encouraged DASH diet, decrease po intake and increase exercise as tolerated.  Avoid trans fats, eat small, frequent meals every 4-5 hours with lean proteins, complex carbs and healthy fats. Minimize simple carbs

## 2015-05-03 NOTE — Progress Notes (Signed)
Patient ID: Stephanie Cordova, female   DOB: 11-Aug-1955, 60 y.o.   MRN: [id number]   Subjective:    Patient ID: Stephanie Cordova, female    DOB: 1955-11-03, 60 y.o.   MRN: [id number]  Chief Complaint  Patient presents with  . Follow-up    HPI Patient is in today for follow up. unfortunatley is not feeling well. Was treated for bronchitis with zpak and her malaise and chills have improved but cough persists. It is productive of clear phlegm, albuterol is marginally helpful. No chest pain or palp. Notes some mild sore throat but this is improved. Notes some right ear pain and popping. Alka Seltser, nexium and Zyrtec have been added helped some. Did not tolerate Tussionex. Denies CP/palp/GU c/o. Taking meds as prescribed  Past Medical History  Diagnosis Date  . Hypertension   . Personal history of colonic polyps   . Insomnia   . Obesity   . Hot flash, menopausal   . Arthritis     b/l knees, bone on bone  . Allergy     seaonal and greens  . Preventative health care 11/30/2012  . Personal history of colonic polyps 11/30/2012    Sees Dr Oletta Lamas at [place]  . Insomnia 11/30/2012  . HTN (hypertension) 11/30/2012  . Hyperglycemia 12/21/2012  . Fatty infiltration of liver 12/21/2012  . Diabetes mellitus type 2 in obese ([place]) 12/21/2012  . Acute bronchitis 12/26/2014    Past Surgical History  Procedure Laterality Date  . Abdominal hysterectomy  1996  . Tonsillectomy  1968  . Knee arthroscopy Right 1996 and 2007  . Tubal ligation      Family History  Problem Relation Age of Onset  . Hypertension Mother   . Hyperlipidemia Mother   . Leukemia Mother   . Cancer Mother   . Hypertension Father   . Hyperlipidemia Father   . Diabetes Father   . Heart disease Father   . Alzheimer's disease Father   . Multiple sclerosis Son   . Heart disease Maternal Grandfather     Social History   Social History  . Marital Status: Widowed    Spouse Name: N/A  . Number of Children: N/A  . Years of Education:  N/A   Occupational History  . Customer service    Social History Main Topics  . Smoking status: Never Smoker   . Smokeless tobacco: Never Used  . Alcohol Use: No  . Drug Use: No  . Sexual Activity: No     Comment: lives alone, widowed in 2006   Other Topics Concern  . Not on file   Social History Narrative    Outpatient Prescriptions Prior to Visit  Medication Sig Dispense Refill  . albuterol (PROVENTIL HFA;VENTOLIN HFA) 108 (90 Base) MCG/ACT inhaler Inhale 2 puffs into the lungs every 6 (six) hours as needed for wheezing or shortness of breath. 1 Inhaler 0  . Blood Glucose Monitoring Suppl (ONE TOUCH ULTRA SYSTEM KIT) W/DEVICE KIT Use as directed twice daily to check blood sugar.  DX E11.9 1 each 0  . glimepiride (AMARYL) 2 MG tablet Take 1 tablet (2 mg total) by mouth 2 (two) times daily. 60 tablet 3  . glucose blood test strip Use as instructed 100 each 6  . Lancets MISC Check blood sugar twice daily. DX code E11.9 100 each 6  . lisinopril (PRINIVIL,ZESTRIL) 2.5 MG tablet Take 1 tablet (2.5 mg total) by mouth daily. 30 tablet 5  . metoprolol tartrate (LOPRESSOR) 25 MG  tablet Take 1 tablet (25 mg total) by mouth 2 (two) times daily. 60 tablet 3  . triamterene-hydrochlorothiazide (MAXZIDE-25) 37.5-25 MG per tablet Take 1 tablet by mouth daily. 90 tablet 3  . chlorpheniramine-HYDROcodone (TUSSIONEX PENNKINETIC ER) 10-8 MG/5ML SUER Take 5 mLs by mouth at bedtime as needed for cough. 115 mL 0  . lisinopril (PRINIVIL,ZESTRIL) 5 MG tablet Take 1 tablet (5 mg total) by mouth 2 (two) times daily. 60 tablet 3  . azithromycin (ZITHROMAX) 250 MG tablet 2 tabs by mouth today, then one tab by mouth daily for 4 more days 6 tablet 0   No facility-administered medications prior to visit.    Allergies  Allergen Reactions  . Losartan Palpitations  . Lexapro [Escitalopram] Other (See Comments)    swelling  . Sulfa Antibiotics Rash    "Burning" rash    Review of Systems  Constitutional:  Positive for chills and malaise/fatigue. Negative for fever.  HENT: Positive for congestion.   Eyes: Negative for blurred vision.  Respiratory: Positive for cough and sputum production. Negative for shortness of breath.   Cardiovascular: Negative for chest pain, palpitations and leg swelling.  Gastrointestinal: Positive for diarrhea. Negative for heartburn, nausea, abdominal pain and blood in stool.  Genitourinary: Negative for dysuria and frequency.  Musculoskeletal: Negative for falls.  Skin: Negative for rash.  Neurological: Negative for dizziness, loss of consciousness and headaches.  Endo/Heme/Allergies: Negative for environmental allergies.  Psychiatric/Behavioral: Negative for depression. The patient is not nervous/anxious.        Objective:    Physical Exam  Constitutional: She is oriented to person, place, and time. She appears well-developed and well-nourished. No distress.  HENT:  Head: Normocephalic and atraumatic.  Nose: Nose normal.  Eyes: Right eye exhibits no discharge. Left eye exhibits no discharge.  Neck: Normal range of motion. Neck supple.  Cardiovascular: Normal rate and regular rhythm.   No murmur heard. Pulmonary/Chest: Effort normal and breath sounds normal.  RUL rhonchi  Abdominal: Soft. Bowel sounds are normal. There is no tenderness.  Musculoskeletal: She exhibits no edema.  Neurological: She is alert and oriented to person, place, and time.  Skin: Skin is warm and dry.  Psychiatric: She has a normal mood and affect.  Nursing note and vitals reviewed.   BP 132/82 mmHg  Pulse 90  Temp(Src) 98.6 F (37 C) (Oral)  Ht 5' 2"  (1.575 m)  Wt 258 lb 4 oz (117.141 kg)  BMI 47.22 kg/m2  SpO2 96%  LMP 01/29/1994 Wt Readings from Last 3 Encounters:  05/03/15 258 lb 4 oz (117.141 kg)  04/21/15 256 lb 6.4 oz (116.302 kg)  04/18/15 259 lb 6.4 oz (117.663 kg)     Lab Results  Component Value Date   WBC 7.5 12/17/2014   HGB 14.1 12/17/2014   HCT 42.5  12/17/2014   PLT 168.0 12/17/2014   GLUCOSE 210* 12/17/2014   CHOL 157 12/17/2014   TRIG 89.0 12/17/2014   HDL 58.60 12/17/2014   LDLCALC 80 12/17/2014   ALT 31 12/17/2014   AST 31 12/17/2014   NA 138 12/17/2014   K 4.1 12/17/2014   CL 102 12/17/2014   CREATININE 0.86 12/17/2014   BUN 11 12/17/2014   CO2 27 12/17/2014   TSH 2.45 12/17/2014   HGBA1C 8.1* 12/17/2014   MICROALBUR 0.7 09/08/2014    Lab Results  Component Value Date   TSH 2.45 12/17/2014   Lab Results  Component Value Date   WBC 7.5 12/17/2014   HGB 14.1 12/17/2014  HCT 42.5 12/17/2014   MCV 89.3 12/17/2014   PLT 168.0 12/17/2014   Lab Results  Component Value Date   NA 138 12/17/2014   K 4.1 12/17/2014   CO2 27 12/17/2014   GLUCOSE 210* 12/17/2014   BUN 11 12/17/2014   CREATININE 0.86 12/17/2014   BILITOT 0.8 12/17/2014   ALKPHOS 74 12/17/2014   AST 31 12/17/2014   ALT 31 12/17/2014   PROT 6.6 12/17/2014   ALBUMIN 3.9 12/17/2014   CALCIUM 9.3 12/17/2014   GFR 71.85 12/17/2014   Lab Results  Component Value Date   CHOL 157 12/17/2014   Lab Results  Component Value Date   HDL 58.60 12/17/2014   Lab Results  Component Value Date   LDLCALC 80 12/17/2014   Lab Results  Component Value Date   TRIG 89.0 12/17/2014   Lab Results  Component Value Date   CHOLHDL 3 12/17/2014   Lab Results  Component Value Date   HGBA1C 8.1* 12/17/2014       Assessment & Plan:   Problem List Items Addressed This Visit    Acute bronchitis    Persistent start Ciprofloxacin will need CXR if does not improve. Encouraged increased rest and hydration, add probiotics, zinc such as Coldeze or Xicam. Treat fevers as needed      Diabetes mellitus type 2 in obese ([place]) - Primary    hgba1c not acceptable, minimize simple carbs. Increase exercise as tolerated. Continue current meds, recheck today and adjust meds prn      Relevant Orders   Hemoglobin A1c   Fatty infiltration of liver    LFTs normalized on  last blood draw. Minimize simple carbs      HTN (hypertension)    Well controlled, no changes to meds. Encouraged heart healthy diet such as the DASH diet and exercise as tolerated.       Obesity    Encouraged DASH diet, decrease po intake and increase exercise as tolerated.  Avoid trans fats, eat small, frequent meals every 4-5 hours with lean proteins, complex carbs and healthy fats. Minimize simple carbs       Other Visit Diagnoses    Breast cancer screening        Relevant Orders    MM Digital Screening       I have discontinued Ms. Emel's azithromycin and chlorpheniramine-HYDROcodone. I am also having her start on ciprofloxacin. Additionally, I am having her maintain her triamterene-hydrochlorothiazide, Lancets, glucose blood, ONE TOUCH ULTRA SYSTEM KIT, lisinopril, glimepiride, metoprolol tartrate, and albuterol.  Meds ordered this encounter  Medications  . ciprofloxacin (CIPRO) 500 MG tablet    Sig: Take 1 tablet (500 mg total) by mouth 2 (two) times daily.    Dispense:  20 tablet    Refill:  0     Penni Homans, MD

## 2015-05-03 NOTE — Assessment & Plan Note (Signed)
Well controlled, no changes to meds. Encouraged heart healthy diet such as the DASH diet and exercise as tolerated.  °

## 2015-05-03 NOTE — Assessment & Plan Note (Signed)
Persistent start Ciprofloxacin will need CXR if does not improve. Encouraged increased rest and hydration, add probiotics, zinc such as Coldeze or Xicam. Treat fevers as needed

## 2015-05-05 ENCOUNTER — Other Ambulatory Visit: Payer: Self-pay | Admitting: Family Medicine

## 2015-05-05 MED ORDER — GLIMEPIRIDE 4 MG PO TABS: 4.0000 mg | ORAL_TABLET | Freq: Two times a day (BID) | ORAL | Status: DC

## 2015-05-17 ENCOUNTER — Ambulatory Visit (HOSPITAL_BASED_OUTPATIENT_CLINIC_OR_DEPARTMENT_OTHER)
Admission: RE | Admit: 2015-05-17 | Discharge: 2015-05-17 | Disposition: A | Discharge status: Home / Self Care | Payer: 59 | Source: Ambulatory Visit | Attending: Family Medicine | Admitting: Family Medicine

## 2015-05-17 DIAGNOSIS — R928 Other abnormal and inconclusive findings on diagnostic imaging of breast: Secondary | ICD-10-CM | POA: Diagnosis not present

## 2015-05-17 DIAGNOSIS — Z1231 Encounter for screening mammogram for malignant neoplasm of breast: Secondary | ICD-10-CM | POA: Diagnosis not present

## 2015-05-17 DIAGNOSIS — Z1239 Encounter for other screening for malignant neoplasm of breast: Secondary | ICD-10-CM

## 2015-05-17 DIAGNOSIS — R921 Mammographic calcification found on diagnostic imaging of breast: Secondary | ICD-10-CM | POA: Diagnosis not present

## 2015-05-18 ENCOUNTER — Telehealth: Payer: Self-pay | Admitting: Family Medicine

## 2015-05-18 NOTE — Telephone Encounter (Signed)
Spoke with Selena BattenKim at [facility] and informed her that no notes were found regarding the patient's CPAP. Also, made her aware that PCP is not in the office today. She voiced understanding and did not have any further questions or concerns.

## 2015-05-18 NOTE — Telephone Encounter (Signed)
Caller name: Larita FifeLynn from [facility] Relation to pt: Customer Service Rep  Call back number: [phone] / [phone] fax #  Reason for call:  Requesting office notes within the last month indicating any information about patient c-pap. Larita FifeLynn mentioned information is needed within the hour please fax to [phone]. Advised PCP is out of the office and nurses are in clinic. Please advise

## 2015-05-24 ENCOUNTER — Other Ambulatory Visit: Payer: Self-pay | Admitting: Family Medicine

## 2015-05-24 DIAGNOSIS — R928 Other abnormal and inconclusive findings on diagnostic imaging of breast: Secondary | ICD-10-CM

## 2015-05-31 ENCOUNTER — Other Ambulatory Visit: Payer: Self-pay | Admitting: Family Medicine

## 2015-05-31 ENCOUNTER — Ambulatory Visit
Admission: RE | Admit: 2015-05-31 | Discharge: 2015-05-31 | Disposition: A | Discharge status: Home / Self Care | Payer: 59 | Source: Ambulatory Visit | Attending: Family Medicine | Admitting: Family Medicine

## 2015-05-31 DIAGNOSIS — R928 Other abnormal and inconclusive findings on diagnostic imaging of breast: Secondary | ICD-10-CM

## 2015-06-07 ENCOUNTER — Other Ambulatory Visit: Payer: Self-pay | Admitting: Family Medicine

## 2015-06-07 ENCOUNTER — Ambulatory Visit
Admission: RE | Admit: 2015-06-07 | Discharge: 2015-06-07 | Disposition: A | Discharge status: Home / Self Care | Payer: 59 | Source: Ambulatory Visit | Attending: Family Medicine | Admitting: Family Medicine

## 2015-06-07 DIAGNOSIS — R928 Other abnormal and inconclusive findings on diagnostic imaging of breast: Secondary | ICD-10-CM

## 2015-06-07 DIAGNOSIS — Z712 Person consulting for explanation of examination or test findings: Secondary | ICD-10-CM

## 2015-06-09 ENCOUNTER — Ambulatory Visit
Admission: RE | Admit: 2015-06-09 | Discharge: 2015-06-09 | Disposition: A | Discharge status: Home / Self Care | Payer: 59 | Source: Ambulatory Visit | Attending: Family Medicine | Admitting: Family Medicine

## 2015-06-09 DIAGNOSIS — Z712 Person consulting for explanation of examination or test findings: Secondary | ICD-10-CM

## 2015-06-21 ENCOUNTER — Encounter: Payer: Self-pay | Admitting: Family Medicine

## 2015-07-05 ENCOUNTER — Other Ambulatory Visit: Payer: Self-pay | Admitting: Family Medicine

## 2015-07-06 ENCOUNTER — Telehealth: Payer: Self-pay | Admitting: Family Medicine

## 2015-07-06 NOTE — Telephone Encounter (Signed)
Relation to ZO:XWRUpt:self Call back [phone] Pharmacy: [id number] - [place], [place] - [address] [phone] (Phone) [phone] (Fax)         Reason for call:  Patient states she lost her blood sugar machine, patent doesn't remember the type of machine but states it should be on file. Patient states she has enough lancets and needles. Please advise

## 2015-07-07 MED ORDER — ONETOUCH ULTRA SYSTEM W/DEVICE KIT: PACK | Status: AC

## 2015-07-07 NOTE — Telephone Encounter (Signed)
Meter sent in. Called the patient left a message meter sent in,

## 2015-07-07 NOTE — Telephone Encounter (Signed)
Patient calling back checking on the status of Rx below

## 2015-07-11 ENCOUNTER — Other Ambulatory Visit: Payer: Self-pay | Admitting: Family Medicine

## 2015-08-09 ENCOUNTER — Ambulatory Visit (INDEPENDENT_AMBULATORY_CARE_PROVIDER_SITE_OTHER): Payer: 59 | Admitting: Family Medicine

## 2015-08-09 ENCOUNTER — Encounter: Payer: Self-pay | Admitting: Family Medicine

## 2015-08-09 VITALS — BP 160/82 | HR 95 | Temp 98.5°F | Ht 61.5 in | Wt 262.1 lb

## 2015-08-09 DIAGNOSIS — R110 Nausea: Secondary | ICD-10-CM | POA: Diagnosis not present

## 2015-08-09 DIAGNOSIS — R1011 Right upper quadrant pain: Secondary | ICD-10-CM | POA: Diagnosis not present

## 2015-08-09 DIAGNOSIS — I10 Essential (primary) hypertension: Secondary | ICD-10-CM

## 2015-08-09 DIAGNOSIS — E669 Obesity, unspecified: Secondary | ICD-10-CM

## 2015-08-09 DIAGNOSIS — L989 Disorder of the skin and subcutaneous tissue, unspecified: Secondary | ICD-10-CM | POA: Diagnosis not present

## 2015-08-09 DIAGNOSIS — E119 Type 2 diabetes mellitus without complications: Secondary | ICD-10-CM | POA: Diagnosis not present

## 2015-08-09 DIAGNOSIS — E1169 Type 2 diabetes mellitus with other specified complication: Secondary | ICD-10-CM

## 2015-08-09 LAB — HEMOGLOBIN A1C: HEMOGLOBIN A1C: 8.9 % — AB (ref 4.6–6.5)

## 2015-08-09 MED ORDER — METOPROLOL TARTRATE 50 MG PO TABS: 50.0000 mg | ORAL_TABLET | Freq: Two times a day (BID) | ORAL | Status: DC

## 2015-08-09 MED ORDER — GLIMEPIRIDE 4 MG PO TABS: 4.0000 mg | ORAL_TABLET | Freq: Two times a day (BID) | ORAL | Status: DC

## 2015-08-09 NOTE — Assessment & Plan Note (Signed)
Did not tolerate Maxzide with nausea, diarrhea, dizziness and weakness. Has felt better since stopping, will discontinue and increase Metoprolol to 50 mg po bid instead

## 2015-08-09 NOTE — Assessment & Plan Note (Signed)
3 distinct episodes of severe, sudden onset RUQ pain with nausea and diarrhea, suspicious for Gallbladder disease. Check an abdominal ultrasound to further assess. Minimize fatty foods

## 2015-08-09 NOTE — Progress Notes (Signed)
Patient ID: BONNELL PLACZEK, female   DOB: 09/02/1955, 60 y.o.   MRN: [id number]   Subjective:    Patient ID: [name], female    DOB: [birth year], 60 y.o.   MRN: [id number]  Chief Complaint  Patient presents with  . Follow-up    HPI Patient is in today for follow up. She feels well. No recent illness but she is complaining of right upper quadrant pain when she takes her Maxzide so she stopped it and she is having less episodes. They can cause her to feel light headed, these episodes, cause her to be nauseaous, dizzy and weak. Denies CP/palp/SOB/HA/congestion/fevers/GI or GU c/o. Taking meds as prescribed. No polyuria or polydipsia.   Past Medical History:  Diagnosis Date  . Acute bronchitis 12/26/2014  . Allergy    seaonal and greens  . Arthritis    b/l knees, bone on bone  . Diabetes mellitus type 2 in obese ([place]) 12/21/2012  . Fatty infiltration of liver 12/21/2012  . Hot flash, menopausal   . HTN (hypertension) 11/30/2012  . Hyperglycemia 12/21/2012  . Hypertension   . Insomnia   . Insomnia 11/30/2012  . Obesity   . Personal history of colonic polyps   . Personal history of colonic polyps 11/30/2012   Sees Dr Oletta Lamas at [place]  . Preventative health care 11/30/2012    Past Surgical History:  Procedure Laterality Date  . ABDOMINAL HYSTERECTOMY  1996  . KNEE ARTHROSCOPY Right 1996 and 2007  . TONSILLECTOMY  1968  . TUBAL LIGATION      Family History  Problem Relation Age of Onset  . Hypertension Mother   . Hyperlipidemia Mother   . Leukemia Mother   . Cancer Mother   . Hypertension Father   . Hyperlipidemia Father   . Diabetes Father   . Heart disease Father   . Alzheimer's disease Father   . Multiple sclerosis Son   . Heart disease Maternal Grandfather     Social History   Social History  . Marital status: Widowed    Spouse name: N/A  . Number of children: N/A  . Years of education: N/A   Occupational History  . Customer service    Social History Main  Topics  . Smoking status: Never Smoker  . Smokeless tobacco: Never Used  . Alcohol use No  . Drug use: No  . Sexual activity: No     Comment: lives alone, widowed in 2006   Other Topics Concern  . Not on file   Social History Narrative  . No narrative on file    Outpatient Medications Prior to Visit  Medication Sig Dispense Refill  . albuterol (PROVENTIL HFA;VENTOLIN HFA) 108 (90 Base) MCG/ACT inhaler Inhale 2 puffs into the lungs every 6 (six) hours as needed for wheezing or shortness of breath. 1 Inhaler 0  . Blood Glucose Monitoring Suppl (ONE TOUCH ULTRA SYSTEM KIT) w/Device KIT Use as directed twice daily to check blood sugar.  DX E11.9 1 each 0  . Lancets MISC Check blood sugar twice daily. DX code E11.9 100 each 6  . lisinopril (PRINIVIL,ZESTRIL) 2.5 MG tablet Take 1 tablet (2.5 mg total) by mouth daily. 30 tablet 5  . ONE TOUCH ULTRA TEST test strip USE AS DIRECTED TWICE A DAY 100 each 2  . ciprofloxacin (CIPRO) 500 MG tablet Take 1 tablet (500 mg total) by mouth 2 (two) times daily. 20 tablet 0  . glimepiride (AMARYL) 4 MG tablet Take 1 tablet (4  mg total) by mouth 2 (two) times daily. 60 tablet 3  . metoprolol tartrate (LOPRESSOR) 25 MG tablet Take 1 tablet (25 mg total) by mouth 2 (two) times daily. 60 tablet 3  . triamterene-hydrochlorothiazide (MAXZIDE-25) 37.5-25 MG tablet TAKE 1 TABLET BY MOUTH DAILY. (Patient not taking: Reported on 08/09/2015) 90 tablet 2   No facility-administered medications prior to visit.     Allergies  Allergen Reactions  . Losartan Palpitations  . Lexapro [Escitalopram] Other (See Comments)    swelling  . Sulfa Antibiotics Rash    "Burning" rash    Review of Systems  Constitutional: Negative for fever and malaise/fatigue.  HENT: Negative for congestion.   Eyes: Negative for blurred vision.  Respiratory: Negative for shortness of breath.   Cardiovascular: Negative for chest pain, palpitations and leg swelling.  Gastrointestinal:  Negative for abdominal pain, blood in stool and nausea.  Genitourinary: Negative for dysuria and frequency.  Musculoskeletal: Negative for falls.  Skin: Negative for rash.  Neurological: Negative for dizziness, loss of consciousness and headaches.  Endo/Heme/Allergies: Negative for environmental allergies.  Psychiatric/Behavioral: Negative for depression. The patient is not nervous/anxious.        Objective:    Physical Exam  Constitutional: She is oriented to person, place, and time. She appears well-developed and well-nourished. No distress.  HENT:  Head: Normocephalic and atraumatic.  Nose: Nose normal.  Eyes: Right eye exhibits no discharge. Left eye exhibits no discharge.  Neck: Normal range of motion. Neck supple.  Cardiovascular: Normal rate and regular rhythm.   No murmur heard. Pulmonary/Chest: Effort normal and breath sounds normal.  Abdominal: Soft. Bowel sounds are normal. There is no tenderness.  Musculoskeletal: She exhibits no edema.  Neurological: She is alert and oriented to person, place, and time.  Skin: Skin is warm and dry. Rash noted.  Lesion tibial plateau.  Psychiatric: She has a normal mood and affect.  Nursing note and vitals reviewed.   BP (!) 160/82   Pulse 95   Temp 98.5 F (36.9 C) (Oral)   Ht 5' 1.5" (1.562 m)   Wt 262 lb 2 oz (118.9 kg)   LMP 01/29/1994   SpO2 97%   BMI 48.73 kg/m  Wt Readings from Last 3 Encounters:  08/09/15 262 lb 2 oz (118.9 kg)  05/03/15 258 lb 4 oz (117.1 kg)  04/21/15 256 lb 6.4 oz (116.3 kg)     Lab Results  Component Value Date   WBC 7.5 12/17/2014   HGB 14.1 12/17/2014   HCT 42.5 12/17/2014   PLT 168.0 12/17/2014   GLUCOSE 338 (H) 08/09/2015   CHOL 157 12/17/2014   TRIG 89.0 12/17/2014   HDL 58.60 12/17/2014   LDLCALC 80 12/17/2014   ALT 42 (H) 08/09/2015   AST 34 08/09/2015   NA 136 08/09/2015   K 4.1 08/09/2015   CL 104 08/09/2015   CREATININE 0.86 08/09/2015   BUN 16 08/09/2015   CO2 24  08/09/2015   TSH 2.45 12/17/2014   HGBA1C 8.9 (H) 08/09/2015   MICROALBUR 0.7 09/08/2014    Lab Results  Component Value Date   TSH 2.45 12/17/2014   Lab Results  Component Value Date   WBC 7.5 12/17/2014   HGB 14.1 12/17/2014   HCT 42.5 12/17/2014   MCV 89.3 12/17/2014   PLT 168.0 12/17/2014   Lab Results  Component Value Date   NA 136 08/09/2015   K 4.1 08/09/2015   CO2 24 08/09/2015   GLUCOSE 338 (H) 08/09/2015  BUN 16 08/09/2015   CREATININE 0.86 08/09/2015   BILITOT 0.7 08/09/2015   ALKPHOS 86 08/09/2015   AST 34 08/09/2015   ALT 42 (H) 08/09/2015   PROT 6.3 08/09/2015   ALBUMIN 3.7 08/09/2015   CALCIUM 8.9 08/09/2015   GFR 71.69 08/09/2015   Lab Results  Component Value Date   CHOL 157 12/17/2014   Lab Results  Component Value Date   HDL 58.60 12/17/2014   Lab Results  Component Value Date   LDLCALC 80 12/17/2014   Lab Results  Component Value Date   TRIG 89.0 12/17/2014   Lab Results  Component Value Date   CHOLHDL 3 12/17/2014   Lab Results  Component Value Date   HGBA1C 8.9 (H) 08/09/2015       Assessment & Plan:   Problem List Items Addressed This Visit    Obesity    Encouraged DASH diet, decrease po intake and increase exercise as tolerated. Needs 7-8 hours of sleep nightly. Avoid trans fats, eat small, frequent meals every 4-5 hours with lean proteins, complex carbs and healthy fats. Minimize simple carbs      Relevant Medications   glimepiride (AMARYL) 4 MG tablet   HTN (hypertension)    Did not tolerate Maxzide with nausea, diarrhea, dizziness and weakness. Has felt better since stopping, will discontinue and increase Metoprolol to 50 mg po bid instead      Relevant Medications   metoprolol (LOPRESSOR) 50 MG tablet   Diabetes mellitus type 2 in obese ([facility])    Her Gl[facility] in [facility] from $5 [facility] She is printed a copy so she can shop around. She acknowledges her sugars are still frequently close  to 200 and even over. hgba1c acceptable, minimize simple carbs. Increase exercise as tolerated. Continue current meds. Check HGBA1C      Relevant Medications   glimepiride (AMARYL) 4 MG tablet   Other Relevant Orders   Hemoglobin A1c (Completed)   Comp Met (CMET) (Completed)   RUQ pain - Primary    3 distinct episodes of severe, sudden onset RUQ pain with nausea and diarrhea, suspicious for Gallbladder disease. Check an abdominal ultrasound to further assess. Minimize fatty foods      Relevant Orders   US Abdomen Complete (Completed)   Comp Met (CMET) (Completed)   Skin lesion of right lower extremity    Right tibial plateau. Scaly plaque with some erythema at base. Referred back to her Dermatologist, Dr Ubaldo Glassing for further consideration      Relevant Orders   Ambulatory referral to Dermatology    Other Visit Diagnoses    Nausea without vomiting       Relevant Orders   US Abdomen Complete (Completed)      I have discontinued Ms. Stys's metoprolol tartrate, ciprofloxacin, and triamterene-hydrochlorothiazide. I am also having her start on metoprolol. Additionally, I am having her maintain her Lancets, lisinopril, albuterol, ONE TOUCH ULTRA SYSTEM KIT, ONE TOUCH ULTRA TEST, and glimepiride.  Meds ordered this encounter  Medications  . glimepiride (AMARYL) 4 MG tablet    Sig: Take 1 tablet (4 mg total) by mouth 2 (two) times daily.    Dispense:  60 tablet    Refill:  3  . metoprolol (LOPRESSOR) 50 MG tablet    Sig: Take 1 tablet (50 mg total) by mouth 2 (two) times daily.    Dispense:  60 tablet    Refill:  5     Penni Homans, MD

## 2015-08-09 NOTE — Progress Notes (Signed)
Pre visit review using our clinic review tool, if applicable. No additional management support is needed unless otherwise documented below in the visit note. 

## 2015-08-09 NOTE — Patient Instructions (Signed)

## 2015-08-09 NOTE — Assessment & Plan Note (Signed)
Her Glimeperide has increased in price from $5 to over 150 at [facility]. She is printed a copy so she can shop around. She acknowledges her sugars are still frequently close to 200 and even over. hgba1c acceptable, minimize simple carbs. Increase exercise as tolerated. Continue current meds. Check HGBA1C

## 2015-08-09 NOTE — Assessment & Plan Note (Signed)
Right tibial plateau. Scaly plaque with some erythema at base. Referred back to her Dermatologist, Dr Nicholas LoseLomax for further consideration

## 2015-08-10 LAB — COMPREHENSIVE METABOLIC PANEL
ALBUMIN: 3.7 g/dL (ref 3.5–5.2)
ALK PHOS: 86 U/L (ref 39–117)
ALT: 42 U/L — ABNORMAL HIGH (ref 0–35)
AST: 34 U/L (ref 0–37)
BUN: 16 mg/dL (ref 6–23)
CO2: 24 mEq/L (ref 19–32)
Calcium: 8.9 mg/dL (ref 8.4–10.5)
Chloride: 104 mEq/L (ref 96–112)
Creatinine, Ser: 0.86 mg/dL (ref 0.40–1.20)
GFR: 71.69 mL/min (ref >60.00)
Glucose, Bld: 338 mg/dL — ABNORMAL HIGH (ref 70–99)
POTASSIUM: 4.1 meq/L (ref 3.5–5.1)
SODIUM: 136 meq/L (ref 135–145)
TOTAL PROTEIN: 6.3 g/dL (ref 6.0–8.3)
Total Bilirubin: 0.7 mg/dL (ref 0.2–1.2)

## 2015-08-12 ENCOUNTER — Ambulatory Visit (HOSPITAL_BASED_OUTPATIENT_CLINIC_OR_DEPARTMENT_OTHER)
Admission: RE | Admit: 2015-08-12 | Discharge: 2015-08-12 | Disposition: A | Discharge status: Home / Self Care | Payer: 59 | Source: Ambulatory Visit | Attending: Family Medicine | Admitting: Family Medicine

## 2015-08-12 ENCOUNTER — Other Ambulatory Visit: Payer: Self-pay | Admitting: Family Medicine

## 2015-08-12 DIAGNOSIS — R1011 Right upper quadrant pain: Secondary | ICD-10-CM

## 2015-08-12 DIAGNOSIS — R110 Nausea: Secondary | ICD-10-CM | POA: Insufficient documentation

## 2015-08-12 DIAGNOSIS — N281 Cyst of kidney, acquired: Secondary | ICD-10-CM | POA: Diagnosis not present

## 2015-08-12 DIAGNOSIS — K828 Other specified diseases of gallbladder: Secondary | ICD-10-CM

## 2015-08-14 ENCOUNTER — Other Ambulatory Visit: Payer: Self-pay | Admitting: Family Medicine

## 2015-08-14 DIAGNOSIS — R1011 Right upper quadrant pain: Secondary | ICD-10-CM

## 2015-08-14 DIAGNOSIS — K838 Other specified diseases of biliary tract: Secondary | ICD-10-CM

## 2015-08-15 ENCOUNTER — Other Ambulatory Visit (HOSPITAL_BASED_OUTPATIENT_CLINIC_OR_DEPARTMENT_OTHER): Payer: 59

## 2015-08-21 NOTE — Assessment & Plan Note (Signed)
Encouraged DASH diet, decrease po intake and increase exercise as tolerated. Needs 7-8 hours of sleep nightly. Avoid trans fats, eat small, frequent meals every 4-5 hours with lean proteins, complex carbs and healthy fats. Minimize simple carbs 

## 2015-08-25 ENCOUNTER — Ambulatory Visit: Payer: Self-pay | Admitting: General Surgery

## 2015-08-25 NOTE — H&P (Signed)
History of Present Illness Stephanie Cordova(Muaaz Brau MD; 08/25/2015 10:32 AM) Patient words: GB.  The patient is a 60 year old female who presents for evaluation of gall stones. The patient is a 60 year old female who is referred by Dr. Rae LipsStacy Blithe for evaluation of gall symptomatic stones. Patient states that she's had this right upper quadrant pain, nausea associated with eating high fatty, spicy foods. She states that it does not radiate. She states stabbing in nature. She states that she's been staying away from high fatty foods which appear to help slightly. Patient on ultrasound which revealed gallbladder sludge.   Other Problems Stephanie Cordova(Michelle R Brooks, CMA; 08/25/2015 9:55 AM) Diabetes Mellitus Heart murmur High blood pressure  Past Surgical History Stephanie Cordova(Michelle R [place], [place]; 08/25/2015 9:55 AM) Breast Biopsy Bilateral. Hysterectomy (not due to cancer) - Partial Tonsillectomy  Diagnostic Studies History Stephanie Cordova(Michelle R Brooks, CMA; 08/25/2015 9:55 AM) Colonoscopy 1-5 years ago Mammogram within last year Pap Smear 1-5 years ago  Allergies Stephanie Cordova(Michelle R Brooks, CMA; 08/25/2015 9:56 AM) Escitalopram Oxalate *ANTIDEPRESSANTS* Losartan Potassium *ANTIHYPERTENSIVES* Sulfa 10 *OPHTHALMIC AGENTS*  Medication History Stephanie Cordova(Michelle R Brooks, CMA; 08/25/2015 9:57 AM) Triamterene-HCTZ (37.5-25MG  Tablet, Oral) Active. Glimepiride (4MG  Tablet, Oral) Active. Metoprolol Tartrate (50MG  Tablet, Oral) Active. Lisinopril (2.5MG  Tablet, Oral) Active. Medications Reconciled  Social History Stephanie Cordova(Michelle R [place], [place]; 08/25/2015 9:55 AM) Caffeine use Carbonated beverages, Coffee, Tea. No alcohol use No drug use Tobacco use Never smoker.  Family History Stephanie Cordova(Michelle R [place], [place]; 08/25/2015 9:55 AM) Diabetes Mellitus Father. Hypertension Father. Melanoma Father.  Pregnancy / Birth History Stephanie Cordova(Michelle R [place], [place]; 08/25/2015 9:55 AM) Age at menarche 15 years. Age of menopause 1256-60 Gravida  2 Length (months) of breastfeeding 7-12 Maternal age 59-25 Para 2    Review of Systems Stephanie Cordova(Tarynn Garling MD; 08/25/2015 10:31 AM) General Present- Feeling well. Not Present- Fever. HEENT Not Present- Earache, Hearing Loss, Hoarseness, Nose Bleed, Oral Ulcers, Ringing in the Ears, Seasonal Allergies, Sinus Pain, Sore Throat, Visual Disturbances, Wears glasses/contact lenses and Yellow Eyes. Respiratory Not Present- Bloody sputum, Chronic Cough, Difficulty Breathing, Snoring and Wheezing. Breast Not Present- Breast Mass, Breast Pain, Nipple Discharge and Skin Changes. Cardiovascular Not Present- Chest Pain, Difficulty Breathing Lying Down, Leg Cramps, Palpitations, Rapid Heart Rate, Shortness of Breath and Swelling of Extremities. Gastrointestinal Present- Change in Bowel Habits, Indigestion and Nausea. Not Present- Abdominal Pain, Bloating, Bloody Stool, Chronic diarrhea, Constipation, Difficulty Swallowing, Excessive gas, Gets full quickly at meals, Hemorrhoids, Rectal Pain and Vomiting. Female Genitourinary Not Present- Frequency, Nocturia, Painful Urination, Pelvic Pain and Urgency. Musculoskeletal Not Present- Myalgia. Neurological Not Present- Decreased Memory, Fainting, Headaches, Numbness, Seizures, Tingling, Tremor, Trouble walking and Weakness. Psychiatric Not Present- Anxiety, Bipolar, Change in Sleep Pattern, Depression, Fearful and Frequent crying. Endocrine Not Present- Cold Intolerance, Excessive Hunger, Hair Changes, Heat Intolerance, Hot flashes and New Diabetes. Hematology Not Present- Blood Thinners, Easy Bruising, Excessive bleeding, Gland problems, HIV and Persistent Infections.  [employer](Michelle R. Brooks CMA; 08/25/2015 9:55 AM) 08/25/2015 9:55 AM Weight: 261.5 lb Height: 61.5in Body Surface Area: 2.13 m Body Mass Index: 48.61 kg/m  BP: 146/88 (Sitting, Left Arm, Standard)       Physical Exam Stephanie Cordova(Avalee Castrellon, MD; 08/25/2015 10:34 AM) General Mental  Status-Alert. General Appearance-Consistent with stated age. Hydration-Well hydrated. Voice-Normal.  Head and Neck Head-normocephalic, atraumatic with no lesions or palpable masses.  Eye Eyeball - Bilateral-Extraocular movements intact. Sclera/Conjunctiva - Bilateral-No scleral icterus.  Chest and Lung Exam Chest and lung exam reveals -quiet, even and easy respiratory effort with no use  of accessory muscles. Inspection Chest Wall - Normal. Back - normal.  Cardiovascular Cardiovascular examination reveals -normal heart sounds, regular rate and rhythm with no murmurs.  Abdomen Inspection Normal Exam - No Hernias. Palpation/Percussion Normal exam - Soft, Non Tender, No Rebound tenderness, No Rigidity (guarding) and No hepatosplenomegaly. Auscultation Normal exam - Bowel sounds normal.  Neurologic Neurologic evaluation reveals -alert and oriented x 3 with no impairment of recent or remote memory. Mental Status-Normal.  Musculoskeletal Normal Exam - Left-Upper Extremity Strength Normal and Lower Extremity Strength Normal. Normal Exam - Right-Upper Extremity Strength Normal, Lower Extremity Weakness.    Assessment & Plan Stephanie Filler MD; 08/25/2015 10:34 AM) SYMPTOMATIC CHOLELITHIASIS (K80.20) Impression: 60 year old female with symptomatic cholelithiasis  1. We will proceed to the operating room for a laparoscopic cholecystectomy 2. Risks and benefits were discussed with the patient to generally include, but not limited to: infection, bleeding, possible need for post op ERCP, damage to the bile ducts, bile leak, and possible need for further surgery. Alternatives were offered and described. All questions were answered and the patient voiced understanding of the procedure and wishes to proceed at this point with a laparoscopic cholecystectomy

## 2015-09-05 ENCOUNTER — Encounter (HOSPITAL_COMMUNITY): Payer: Self-pay

## 2015-09-05 ENCOUNTER — Encounter (HOSPITAL_COMMUNITY)
Admission: RE | Admit: 2015-09-05 | Discharge: 2015-09-05 | Disposition: A | Discharge status: Home / Self Care | Payer: 59 | Source: Ambulatory Visit | Attending: General Surgery | Admitting: General Surgery

## 2015-09-05 DIAGNOSIS — E119 Type 2 diabetes mellitus without complications: Secondary | ICD-10-CM | POA: Diagnosis not present

## 2015-09-05 DIAGNOSIS — Z79899 Other long term (current) drug therapy: Secondary | ICD-10-CM | POA: Diagnosis not present

## 2015-09-05 DIAGNOSIS — I10 Essential (primary) hypertension: Secondary | ICD-10-CM | POA: Diagnosis not present

## 2015-09-05 DIAGNOSIS — Z7984 Long term (current) use of oral hypoglycemic drugs: Secondary | ICD-10-CM | POA: Diagnosis not present

## 2015-09-05 DIAGNOSIS — K8010 Calculus of gallbladder with chronic cholecystitis without obstruction: Secondary | ICD-10-CM | POA: Diagnosis not present

## 2015-09-05 HISTORY — DX: Personal history of other diseases of the respiratory system: Z87.09

## 2015-09-05 HISTORY — DX: Personal history of colonic polyps: Z86.010

## 2015-09-05 HISTORY — DX: Personal history of colon polyps, unspecified: Z86.0100

## 2015-09-05 HISTORY — DX: Pneumonia, unspecified organism: J18.9

## 2015-09-05 LAB — GLUCOSE, CAPILLARY: Glucose-Capillary: 194 mg/dL — ABNORMAL HIGH (ref 65–99)

## 2015-09-05 LAB — BASIC METABOLIC PANEL
Anion gap: 9 (ref 5–15)
BUN: 15 mg/dL (ref 6–20)
CALCIUM: 9.4 mg/dL (ref 8.9–10.3)
CHLORIDE: 107 mmol/L (ref 101–111)
CO2: 22 mmol/L (ref 22–32)
CREATININE: 0.82 mg/dL (ref 0.44–1.00)
GFR calc Af Amer: >60 mL/min (ref >60)
GFR calc non Af Amer: >60 mL/min (ref >60)
Glucose, Bld: 177 mg/dL — ABNORMAL HIGH (ref 65–99)
Potassium: 4 mmol/L (ref 3.5–5.1)
Sodium: 138 mmol/L (ref 135–145)

## 2015-09-05 LAB — CBC
HEMATOCRIT: 41.4 % (ref 36.0–46.0)
HEMOGLOBIN: 13.6 g/dL (ref 12.0–15.0)
MCH: 29.4 pg (ref 26.0–34.0)
MCHC: 32.9 g/dL (ref 30.0–36.0)
MCV: 89.4 fL (ref 78.0–100.0)
Platelets: 144 10*3/uL — ABNORMAL LOW (ref 150–400)
RBC: 4.63 MIL/uL (ref 3.87–5.11)
RDW: 14.1 % (ref 11.5–15.5)
WBC: 7.7 10*3/uL (ref 4.0–10.5)

## 2015-09-05 MED ORDER — CHLORHEXIDINE GLUCONATE CLOTH 2 % EX PADS: 6.0000 | MEDICATED_PAD | Freq: Once | CUTANEOUS | Status: DC

## 2015-09-05 NOTE — Pre-Procedure Instructions (Signed)
Stephanie Cordova  09/05/2015      [id number] [place], [place] - [address] [place] [address] Phone: [phone] Fax: [phone]    Your procedure is scheduled on Tues, Aug 8 @ 9:15 AM  [facility] AM  Call this number if you have problems the morning of surgery:  [phone]   Remember:  Do not eat food or drink liquids after midnight.  Take these medicines the morning of surgery with A SIP OF WATER Albuterol<Bring Your Inhaler With You> and Metoprolol(Lopressor)              No Goody's,BC's,Aleve,Advil,Motrin,Ibuprofen,Fish Oil,or any Herbal Medications.      How to Manage Your Diabetes Before and After Surgery  Why is it important to control my blood sugar before and after surgery? . Improving blood sugar levels before and after surgery helps healing and can limit problems. . A way of improving blood sugar control is eating a healthy diet by: o  Eating less sugar and carbohydrates o  Increasing activity/exercise o  Talking with your doctor about reaching your blood sugar goals . High blood sugars (greater than 180 mg/dL) can raise your risk of infections and slow your recovery, so you will need to focus on controlling your diabetes during the weeks before surgery. . Make sure that the doctor who takes care of your diabetes knows about your planned surgery including the date and location.  How do I manage my blood sugar before surgery? . Check your blood sugar at least 4 times a day, starting 2 days before surgery, to make sure that the level is not too high or low. o Check your blood sugar the morning of your surgery when you wake up and every 2 hours until you get to the Short Stay unit. . If your blood sugar is less than 70 mg/dL, you will need to treat for low blood sugar: o Do not take insulin. o Treat a low blood sugar (less than 70 mg/dL) with  cup of  clear juice (cranberry or apple), 4 glucose tablets, OR glucose gel. o Recheck blood sugar in 15 minutes after treatment (to make sure it is greater than 70 mg/dL). If your blood sugar is not greater than 70 mg/dL on recheck, call [phone] for further instructions. . Report your blood sugar to the short stay nurse when you get to Short Stay.  . If you are admitted to the hospital after surgery: o Your blood sugar will be checked by the staff and you will probably be given insulin after surgery (instead of oral diabetes medicines) to make sure you have good blood sugar levels. o The goal for blood sugar control after surgery is 80-180 mg/dL.              WHAT DO I DO ABOUT MY DIABETES MEDICATION?   Marland Kitchen. Do not take oral diabetes medicines (pills) the morning of surgery.  . .       .   . The day of surgery, do not take other diabetes injectables, including Byetta (exenatide), Bydureon (exenatide ER), Victoza (liraglutide), or Trulicity (dulaglutide).  . If your CBG is greater than 220 mg/dL, you may take  of your sliding scale (correction) dose of insulin.  Other Instructions:          Patient Signature:  Date:   Nurse Signature:  Date:   Reviewed and Endorsed by [facility] Patient Education Committee, August 2015   Do not wear jewelry, make-up or nail polish.  Do not wear lotions, powders, or perfumes.    Do not shave 48 hours prior to surgery.    Do not bring valuables to the hospital.  [facility] is not responsible for any belongings or valuables.  Contacts, dentures or bridgework may not be worn into surgery.  Leave your suitcase in the car.  After surgery it may be brought to your room.  For patients admitted to the hospital, discharge time will be determined by your treatment team.  Patients discharged the day of surgery will not be allowed to drive home.    [facility]  Before surgery, you can play an  important role.  Because skin is not sterile, your skin needs to be as free of germs as possible.  You can reduce the number of germs on you skin by washing with CHG (chlorahexidine gluconate) soap before surgery.  CHG is an antiseptic cleaner which kills germs and bonds with the skin to continue killing germs even after washing.  Please DO NOT use if you have an allergy to CHG or antibacterial soaps.  If your skin becomes reddened/irritated stop using the CHG and inform your nurse when you arrive at Short Stay.  Do not shave (including legs and underarms) for at least 48 hours prior to the first CHG shower.  You may shave your face.  Please follow these instructions carefully:   1.  Shower with CHG Soap the night before surgery and the                                morning of Surgery.  2.  If you choose to wash your hair, wash your hair first as usual with your       normal shampoo.  3.  After you shampoo, rinse your hair and body thoroughly to remove the                      Shampoo.  4.  Use CHG as you would any other liquid soap.  You can apply chg directly       to the skin and wash gently with scrungie or a clean washcloth.  5.  Apply the CHG Soap to your body ONLY FROM THE NECK DOWN.        Do not use on open wounds or open sores.  Avoid contact with your eyes,       ears, mouth and genitals (private parts).  Wash genitals (private parts)       with your normal soap.  6.  Wash thoroughly, paying special attention to the area where your surgery        will be performed.  7.  Thoroughly rinse your body with warm water from the neck down.  8.  DO NOT shower/wash with your normal soap after using and rinsing off       the CHG Soap.  9.  Pat yourself dry with a clean towel.            10.  Wear clean pajamas.            11.  Place clean sheets on your bed the night of your first shower and do not  sleep with pets.  Day of Surgery  Do not apply any lotions/deoderants the morning of  surgery.  Please wear clean clothes to the hospital/surgery center.    Please read over the following fact sheets that you were given. Surgical Site Infection Prevention

## 2015-09-05 NOTE — Progress Notes (Addendum)
Cardiologist denies  Medical Md is Dr.Stacy Abner GreenspanBlyth   Echo report in epic from 2016  Stress test denies  Heart cath denies  EKG denies in past yr  CXR denies

## 2015-09-06 ENCOUNTER — Ambulatory Visit (HOSPITAL_COMMUNITY): Payer: 59 | Admitting: Certified Registered Nurse Anesthetist

## 2015-09-06 ENCOUNTER — Ambulatory Visit (HOSPITAL_COMMUNITY)
Admission: RE | Admit: 2015-09-06 | Discharge: 2015-09-06 | Disposition: A | Discharge status: Home / Self Care | Payer: 59 | Source: Ambulatory Visit | Attending: General Surgery | Admitting: General Surgery

## 2015-09-06 ENCOUNTER — Encounter (HOSPITAL_COMMUNITY): Payer: Self-pay | Admitting: *Deleted

## 2015-09-06 ENCOUNTER — Ambulatory Visit (HOSPITAL_COMMUNITY): Payer: 59 | Admitting: Emergency Medicine

## 2015-09-06 ENCOUNTER — Encounter (HOSPITAL_COMMUNITY)
Admission: RE | Disposition: A | Discharge status: Home / Self Care | Payer: Self-pay | Source: Ambulatory Visit | Attending: General Surgery

## 2015-09-06 DIAGNOSIS — Z79899 Other long term (current) drug therapy: Secondary | ICD-10-CM | POA: Insufficient documentation

## 2015-09-06 DIAGNOSIS — I10 Essential (primary) hypertension: Secondary | ICD-10-CM | POA: Insufficient documentation

## 2015-09-06 DIAGNOSIS — E119 Type 2 diabetes mellitus without complications: Secondary | ICD-10-CM | POA: Insufficient documentation

## 2015-09-06 DIAGNOSIS — Z7984 Long term (current) use of oral hypoglycemic drugs: Secondary | ICD-10-CM | POA: Insufficient documentation

## 2015-09-06 DIAGNOSIS — K8010 Calculus of gallbladder with chronic cholecystitis without obstruction: Secondary | ICD-10-CM | POA: Diagnosis not present

## 2015-09-06 HISTORY — PX: CHOLECYSTECTOMY: SHX55

## 2015-09-06 LAB — GLUCOSE, CAPILLARY
GLUCOSE-CAPILLARY: 181 mg/dL — AB (ref 65–99)
GLUCOSE-CAPILLARY: 196 mg/dL — AB (ref 65–99)
Glucose-Capillary: 234 mg/dL — ABNORMAL HIGH (ref 65–99)

## 2015-09-06 SURGERY — LAPAROSCOPIC CHOLECYSTECTOMY
Anesthesia: General | Site: Abdomen

## 2015-09-06 MED ORDER — SODIUM CHLORIDE 0.9 % IV SOLN: 250.0000 mL | INTRAVENOUS | Status: DC | PRN

## 2015-09-06 MED ORDER — PROPOFOL 10 MG/ML IV BOLUS
INTRAVENOUS | Status: DC | PRN
  Administered 2015-09-06: 150 mg via INTRAVENOUS

## 2015-09-06 MED ORDER — ROCURONIUM BROMIDE 100 MG/10ML IV SOLN
INTRAVENOUS | Status: DC | PRN
  Administered 2015-09-06: 40 mg via INTRAVENOUS
  Administered 2015-09-06: 5 mg via INTRAVENOUS

## 2015-09-06 MED ORDER — MIDAZOLAM HCL 2 MG/2ML IJ SOLN
INTRAMUSCULAR | Status: AC
  Filled 2015-09-06: qty 2

## 2015-09-06 MED ORDER — LACTATED RINGERS IV SOLN
INTRAVENOUS | Status: DC
  Administered 2015-09-06 (×2): via INTRAVENOUS

## 2015-09-06 MED ORDER — BUPIVACAINE HCL (PF) 0.25 % IJ SOLN
INTRAMUSCULAR | Status: AC
  Filled 2015-09-06: qty 30

## 2015-09-06 MED ORDER — SODIUM CHLORIDE 0.9 % IR SOLN
Status: DC | PRN
  Administered 2015-09-06: 1000 mL

## 2015-09-06 MED ORDER — ONDANSETRON HCL 4 MG/2ML IJ SOLN
INTRAMUSCULAR | Status: DC | PRN
  Administered 2015-09-06: 4 mg via INTRAVENOUS

## 2015-09-06 MED ORDER — LACTATED RINGERS IV SOLN
INTRAVENOUS | Status: DC
  Administered 2015-09-06: 11:00:00 via INTRAVENOUS

## 2015-09-06 MED ORDER — OXYCODONE HCL 5 MG PO TABS
ORAL_TABLET | ORAL | Status: AC
  Filled 2015-09-06: qty 1

## 2015-09-06 MED ORDER — BUPIVACAINE HCL 0.25 % IJ SOLN
INTRAMUSCULAR | Status: DC | PRN
  Administered 2015-09-06: 6 mL

## 2015-09-06 MED ORDER — OXYCODONE HCL 5 MG PO TABS
5.0000 mg | ORAL_TABLET | ORAL | Status: DC | PRN
  Administered 2015-09-06 (×2): 5 mg via ORAL
  Filled 2015-09-06: qty 2

## 2015-09-06 MED ORDER — FENTANYL CITRATE (PF) 100 MCG/2ML IJ SOLN
INTRAMUSCULAR | Status: DC | PRN
  Administered 2015-09-06: 100 ug via INTRAVENOUS
  Administered 2015-09-06: 25 ug via INTRAVENOUS

## 2015-09-06 MED ORDER — HYDROMORPHONE HCL 1 MG/ML IJ SOLN
INTRAMUSCULAR | Status: AC
  Filled 2015-09-06: qty 1

## 2015-09-06 MED ORDER — MIDAZOLAM HCL 5 MG/5ML IJ SOLN
INTRAMUSCULAR | Status: DC | PRN
  Administered 2015-09-06: 2 mg via INTRAVENOUS

## 2015-09-06 MED ORDER — PROPOFOL 10 MG/ML IV BOLUS
INTRAVENOUS | Status: AC
  Filled 2015-09-06: qty 20

## 2015-09-06 MED ORDER — HYDROMORPHONE HCL 1 MG/ML IJ SOLN
0.2500 mg | INTRAMUSCULAR | Status: DC | PRN
  Administered 2015-09-06 (×2): 0.25 mg via INTRAVENOUS
  Administered 2015-09-06 (×2): 0.5 mg via INTRAVENOUS

## 2015-09-06 MED ORDER — 0.9 % SODIUM CHLORIDE (POUR BTL) OPTIME
TOPICAL | Status: DC | PRN
  Administered 2015-09-06: 1000 mL

## 2015-09-06 MED ORDER — DEXTROSE 5 % IV SOLN
3.0000 g | INTRAVENOUS | Status: AC
  Administered 2015-09-06: 2 g via INTRAVENOUS
  Filled 2015-09-06: qty 3000

## 2015-09-06 MED ORDER — EPHEDRINE SULFATE 50 MG/ML IJ SOLN
INTRAMUSCULAR | Status: DC | PRN
  Administered 2015-09-06: 10 mg via INTRAVENOUS

## 2015-09-06 MED ORDER — SUGAMMADEX SODIUM 500 MG/5ML IV SOLN
INTRAVENOUS | Status: DC | PRN
  Administered 2015-09-06: 300 mg via INTRAVENOUS

## 2015-09-06 MED ORDER — LIDOCAINE HCL (CARDIAC) 20 MG/ML IV SOLN
INTRAVENOUS | Status: DC | PRN
  Administered 2015-09-06: 80 mg via INTRAVENOUS

## 2015-09-06 MED ORDER — PHENYLEPHRINE HCL 10 MG/ML IJ SOLN
INTRAMUSCULAR | Status: DC | PRN
  Administered 2015-09-06 (×2): 80 ug via INTRAVENOUS
  Administered 2015-09-06: 120 ug via INTRAVENOUS
  Administered 2015-09-06: 80 ug via INTRAVENOUS
  Administered 2015-09-06 (×2): 120 ug via INTRAVENOUS

## 2015-09-06 MED ORDER — OXYCODONE-ACETAMINOPHEN 5-325 MG PO TABS: 1.0000 | ORAL_TABLET | ORAL | 0 refills | Status: DC | PRN

## 2015-09-06 MED ORDER — FENTANYL CITRATE (PF) 250 MCG/5ML IJ SOLN
INTRAMUSCULAR | Status: AC
  Filled 2015-09-06: qty 5

## 2015-09-06 SURGICAL SUPPLY — 43 items
APL SKNCLS STERI-STRIP NONHPOA (GAUZE/BANDAGES/DRESSINGS) ×1
BAG SPEC RTRVL 10 TROC 200 (ENDOMECHANICALS)
BENZOIN TINCTURE PRP APPL 2/3 (GAUZE/BANDAGES/DRESSINGS) ×2 IMPLANT
CANISTER SUCTION 2500CC (MISCELLANEOUS) ×2 IMPLANT
CHLORAPREP W/TINT 26ML (MISCELLANEOUS) ×2 IMPLANT
CLIP LIGATING HEMO O LOK GREEN (MISCELLANEOUS) ×2 IMPLANT
COVER SURGICAL LIGHT HANDLE (MISCELLANEOUS) ×2 IMPLANT
COVER TRANSDUCER ULTRASND (DRAPES) ×2 IMPLANT
DEVICE TROCAR PUNCTURE CLOSURE (ENDOMECHANICALS) ×2 IMPLANT
ELECT REM PT RETURN 9FT ADLT (ELECTROSURGICAL) ×2
ELECTRODE REM PT RTRN 9FT ADLT (ELECTROSURGICAL) ×1 IMPLANT
GAUZE SPONGE 2X2 8PLY STRL LF (GAUZE/BANDAGES/DRESSINGS) ×1 IMPLANT
GLOVE BIO SURGEON STRL SZ7.5 (GLOVE) ×4 IMPLANT
GLOVE BIOGEL PI IND STRL 7.0 (GLOVE) ×1 IMPLANT
GLOVE BIOGEL PI IND STRL 7.5 (GLOVE) ×1 IMPLANT
GLOVE BIOGEL PI INDICATOR 7.0 (GLOVE) ×1
GLOVE BIOGEL PI INDICATOR 7.5 (GLOVE) ×1
GLOVE ECLIPSE 7.0 STRL STRAW (GLOVE) ×2 IMPLANT
GOWN STRL REUS W/ TWL LRG LVL3 (GOWN DISPOSABLE) ×2 IMPLANT
GOWN STRL REUS W/ TWL XL LVL3 (GOWN DISPOSABLE) ×1 IMPLANT
GOWN STRL REUS W/TWL LRG LVL3 (GOWN DISPOSABLE) ×4
GOWN STRL REUS W/TWL XL LVL3 (GOWN DISPOSABLE) ×2
KIT BASIN OR (CUSTOM PROCEDURE TRAY) ×2 IMPLANT
KIT ROOM TURNOVER OR (KITS) ×2 IMPLANT
NEEDLE INSUFFLATION 14GA 120MM (NEEDLE) ×2 IMPLANT
NS IRRIG 1000ML POUR BTL (IV SOLUTION) ×2 IMPLANT
PAD ARMBOARD 7.5X6 YLW CONV (MISCELLANEOUS) ×4 IMPLANT
POUCH RETRIEVAL ECOSAC 10 (ENDOMECHANICALS) IMPLANT
POUCH RETRIEVAL ECOSAC 10MM (ENDOMECHANICALS)
SCISSORS LAP 5X35 DISP (ENDOMECHANICALS) ×2 IMPLANT
SET IRRIG TUBING LAPAROSCOPIC (IRRIGATION / IRRIGATOR) ×2 IMPLANT
SLEEVE ENDOPATH XCEL 5M (ENDOMECHANICALS) ×4 IMPLANT
SPECIMEN JAR SMALL (MISCELLANEOUS) ×2 IMPLANT
SPONGE GAUZE 2X2 STER 10/PKG (GAUZE/BANDAGES/DRESSINGS) ×1
STRIP CLOSURE SKIN 1/2X4 (GAUZE/BANDAGES/DRESSINGS) ×2 IMPLANT
SUT MNCRL AB 3-0 PS2 18 (SUTURE) ×2 IMPLANT
TAPE CLOTH SURG 4X10 WHT LF (GAUZE/BANDAGES/DRESSINGS) ×2 IMPLANT
TOWEL OR 17X24 6PK STRL BLUE (TOWEL DISPOSABLE) ×2 IMPLANT
TOWEL OR 17X26 10 PK STRL BLUE (TOWEL DISPOSABLE) ×2 IMPLANT
TRAY LAPAROSCOPIC MC (CUSTOM PROCEDURE TRAY) ×2 IMPLANT
TROCAR XCEL NON-BLD 11X100MML (ENDOMECHANICALS) ×2 IMPLANT
TROCAR XCEL NON-BLD 5MMX100MML (ENDOMECHANICALS) ×2 IMPLANT
TUBING INSUFFLATION (TUBING) ×2 IMPLANT

## 2015-09-06 NOTE — Discharge Instructions (Signed)
[facility] ______[facility] °LAPAROSCOPIC SURGERY: POST OP INSTRUCTIONS °Always review your discharge instruction sheet given to you by the facility where your surgery was performed. °IF YOU HAVE DISABILITY OR FAMILY LEAVE FORMS, YOU MUST BRING THEM TO THE OFFICE FOR PROCESSING.   °DO NOT GIVE THEM TO YOUR DOCTOR. ° °1. A prescription for pain medication may be given to you upon discharge.  Take your pain medication as prescribed, if needed.  If narcotic pain medicine is not needed, then you may take acetaminophen (Tylenol) or ibuprofen (Advil) as needed. °2. Take your usually prescribed medications unless otherwise directed. °3. If you need a refill on your pain medication, please contact your pharmacy.  They will contact our office to request authorization. Prescriptions will not be filled after 5pm or on week-ends. °4. You should follow a light diet the first few days after arrival home, such as soup and crackers, etc.  Be sure to include lots of fluids daily. °5. Most patients will experience some swelling and bruising in the area of the incisions.  Ice packs will help.  Swelling and bruising can take several days to resolve.  °6. It is common to experience some constipation if taking pain medication after surgery.  Increasing fluid intake and taking a stool softener (such as Colace) will usually help or prevent this problem from occurring.  A mild laxative (Milk of Magnesia or Miralax) should be taken according to package instructions if there are no bowel movements after 48 hours. °7. Unless discharge instructions indicate otherwise, you may remove your bandages 24-48 hours after surgery, and you may shower at that time.  You may have steri-strips (small skin tapes) in place directly over the incision.  These strips should be left on the skin for 7-10 days.  If your surgeon used skin glue on the incision, you may shower in 24 hours.  The glue will flake off over the next 2-3 weeks.  Any sutures or  staples will be removed at the office during your follow-up visit. °8. ACTIVITIES:  You may resume regular (light) daily activities beginning the next day--such as daily self-care, walking, climbing stairs--gradually increasing activities as tolerated.  You may have sexual intercourse when it is comfortable.  Refrain from any heavy lifting or straining until approved by your doctor. °a. You may drive when you are no longer taking prescription pain medication, you can comfortably wear a seatbelt, and you can safely maneuver your car and apply brakes. °b. RETURN TO WORK:  __________________________________________________________ °9. You should see your doctor in the office for a follow-up appointment approximately 2-3 weeks after your surgery.  Make sure that you call for this appointment within a day or two after you arrive home to insure a convenient appointment time. °10. OTHER INSTRUCTIONS: __________________________________________________________________________________________________________________________ __________________________________________________________________________________________________________________________ °WHEN TO CALL YOUR DOCTOR: °1. Fever over 101.0 °2. Inability to urinate °3. Continued bleeding from incision. °4. Increased pain, redness, or drainage from the incision. °5. Increasing abdominal pain ° °The clinic staff is available to answer your questions during regular business hours.  Please don’t hesitate to call and ask to speak to one of the nurses for clinical concerns.  If you have a medical emergency, go to the nearest emergency room or call 911.  A surgeon from [facility] is always on call at the hospital. °[address], [address], [place], [place]  [address] ? [address] [address], [place], [place]   [address] °[phone] ? [phone] ? FAX [phone] °Web site:   [email or web address] °

## 2015-09-06 NOTE — Op Note (Signed)
09/06/2015  10:40 AM  PATIENT:  Stephanie Cordova  60 y.o. female  PRE-OPERATIVE DIAGNOSIS:  cholelithiasis  POST-OPERATIVE DIAGNOSIS:  cholelithiasis  PROCEDURE:  Procedure(s): LAPAROSCOPIC CHOLECYSTECTOMY (N/A)  SURGEON:  Surgeon(s) and Role:    * Axel FillerArmando Fern Asmar, MD - Primary  ANESTHESIA:   local and general  EBL:  5cc  BLOOD ADMINISTERED:none  DRAINS: none   LOCAL MEDICATIONS USED:  BUPIVICAINE   SPECIMEN:  Source of Specimen:  gallbladder  DISPOSITION OF SPECIMEN:  PATHOLOGY  COUNTS:  YES  TOURNIQUET:  * No tourniquets in log *  DICTATION: .Dragon Dictation The patient was taken to the operating and placed in the supine position with bilateral SCDs in place. The patient was prepped and draped in the usual sterile fashion. A time out was called and all facts were verified. A pneumoperitoneum was obtained via A Veress needle technique to a pressure of 14mm of mercury.  A 5mm trochar was then placed in the right upper quadrant under visualization, and there were no injuries to any abdominal organs. A 11 mm port was then placed in the umbilical region after infiltrating with local anesthesia under direct visualization. A second and third epigastric port and right lower quadrant port placement under direct visualization, respectively. The liver was very fatty, large, and nodular appearing. The gallbladder was identified and retracted, the peritoneum was then sharply dissected from the gallbladder and this dissection was carried down to Calot's triangle. The gallbladder was identified and stripped away circumferentially and seen going into the gallbladder 360, the critical angle was obtained.  2 clips were placed proximally one distally and the cystic duct transected. The cystic artery was identified and 2 clips placed proximally and one distally and transected. We then proceeded to remove the gallbladder off the hepatic fossa with Bovie cautery. A retrieval bag was then placed in  the abdomen and gallbladder placed in the bag. The hepatic fossa was then reexamined and hemostasis was achieved with Bovie cautery and was excellent at the end of the case. The subhepatic fossa and perihepatic fossa was then irrigated until the effluent was clear.  The gallbladder and bag were removed from the abdominal cavity. The 11 mm trocar fascia was reapproximated with the Endo Close #1 Vicryl x1. The pneumoperitoneum was evacuated and all trochars removed under direct visulalization. The skin was then closed with 4-0 Monocryl and the skin dressed with Steri-Strips, gauze, and tape. The patient was awaken from general anesthesia and taken to the recovery room in stable condition.   PLAN OF CARE: Discharge to home after PACU  PATIENT DISPOSITION:  PACU - hemodynamically stable.   Delay start of Pharmacological VTE agent (>24hrs) due to surgical blood loss or risk of bleeding: not applicable

## 2015-09-06 NOTE — Anesthesia Preprocedure Evaluation (Signed)
Anesthesia Evaluation  Patient identified by MRN, date of birth, ID band Patient awake    Reviewed: Allergy & Precautions, H&P , NPO status , Patient's Chart, lab work & pertinent test results, reviewed documented beta blocker date and time   Airway Mallampati: II  TM Distance: >3 FB Neck ROM: full    Dental no notable dental hx. (+) Dental Advisory Given, Teeth Intact   Pulmonary neg pulmonary ROS,    Pulmonary exam normal breath sounds clear to auscultation       Cardiovascular Exercise Tolerance: Good hypertension, On Medications and On Home Beta Blockers Normal cardiovascular exam Rhythm:regular Rate:Normal     Neuro/Psych negative neurological ROS  negative psych ROS   GI/Hepatic negative GI ROS, Neg liver ROS,   Endo/Other  diabetes, Well Controlled, Type 2, Oral Hypoglycemic AgentsMorbid obesity  Renal/GU negative Renal ROS  negative genitourinary   Musculoskeletal   Abdominal   Peds  Hematology negative hematology ROS (+)   Anesthesia Other Findings   Reproductive/Obstetrics negative OB ROS                             Anesthesia Physical Anesthesia Plan  ASA: III  Anesthesia Plan: General   Post-op Pain Management:    Induction: Intravenous  Airway Management Planned: Oral ETT  Additional Equipment:   Intra-op Plan:   Post-operative Plan: Extubation in OR  Informed Consent: I have reviewed the patients History and Physical, chart, labs and discussed the procedure including the risks, benefits and alternatives for the proposed anesthesia with the patient or authorized representative who has indicated his/her understanding and acceptance.   Dental Advisory Given  Plan Discussed with: CRNA  Anesthesia Plan Comments:         Anesthesia Quick Evaluation

## 2015-09-06 NOTE — H&P (View-Only) (Signed)
History of Present Illness Stephanie Cordova(Lux Meaders MD; 08/25/2015 10:32 AM) Patient words: GB.  The patient is a 60 year old female who presents for evaluation of gall stones. The patient is a 60 year old female who is referred by Dr. Rae LipsStacy Blithe for evaluation of gall symptomatic stones. Patient states that she's had this right upper quadrant pain, nausea associated with eating high fatty, spicy foods. She states that it does not radiate. She states stabbing in nature. She states that she's been staying away from high fatty foods which appear to help slightly. Patient on ultrasound which revealed gallbladder sludge.   Other Problems Stephanie Cordova(Michelle R Brooks, CMA; 08/25/2015 9:55 AM) Diabetes Mellitus Heart murmur High blood pressure  Past Surgical History Stephanie Cordova(Michel[place], [place]; 08/25/2015 9:55 AM) Breast Biopsy Bilateral. Hysterectomy (not due to cancer) - Partial Tonsillectomy  Diagnostic Studies History Stephanie Cordova(Michelle R Brooks, CMA; 08/25/2015 9:55 AM) Colonoscopy 1-5 years ago Mammogram within last year Pap Smear 1-5 years ago  Allergies Stephanie Cordova(Michelle R Brooks, CMA; 08/25/2015 9:56 AM) Escitalopram Oxalate *ANTIDEPRESSANTS* Losartan Potassium *ANTIHYPERTENSIVES* Sulfa 10 *OPHTHALMIC AGENTS*  Medication History Stephanie Cordova(Michelle R Brooks, CMA; 08/25/2015 9:57 AM) Triamterene-HCTZ (37.5-25MG  Tablet, Oral) Active. Glimepiride (4MG  Tablet, Oral) Active. Metoprolol Tartrate (50MG  Tablet, Oral) Active. Lisinopril (2.5MG  Tablet, Oral) Active. Medications Reconciled  Social History Stephanie Cordova(Michelle R [place], [place]; 08/25/2015 9:55 AM) Caffeine use Carbonated beverages, Coffee, Tea. No alcohol use No drug use Tobacco use Never smoker.  Family History Stephanie Cordova(Michelle R [place], [place]; 08/25/2015 9:55 AM) Diabetes Mellitus Father. Hypertension Father. Melanoma Father.  Pregnancy / Birth History Stephanie Cordova(Michelle R [place], [place]; 08/25/2015 9:55 AM) Age at menarche 15 years. Age of menopause 1256-60 Gravida  2 Length (months) of breastfeeding 7-12 Maternal age 59-25 Para 2    Review of Systems Stephanie Cordova(Franky Reier MD; 08/25/2015 10:31 AM) General Present- Feeling well. Not Present- Fever. HEENT Not Present- Earache, Hearing Loss, Hoarseness, Nose Bleed, Oral Ulcers, Ringing in the Ears, Seasonal Allergies, Sinus Pain, Sore Throat, Visual Disturbances, Wears glasses/contact lenses and Yellow Eyes. Respiratory Not Present- Bloody sputum, Chronic Cough, Difficulty Breathing, Snoring and Wheezing. Breast Not Present- Breast Mass, Breast Pain, Nipple Discharge and Skin Changes. Cardiovascular Not Present- Chest Pain, Difficulty Breathing Lying Down, Leg Cramps, Palpitations, Rapid Heart Rate, Shortness of Breath and Swelling of Extremities. Gastrointestinal Present- Change in Bowel Habits, Indigestion and Nausea. Not Present- Abdominal Pain, Bloating, Bloody Stool, Chronic diarrhea, Constipation, Difficulty Swallowing, Excessive gas, Gets full quickly at meals, Hemorrhoids, Rectal Pain and Vomiting. Female Genitourinary Not Present- Frequency, Nocturia, Painful Urination, Pelvic Pain and Urgency. Musculoskeletal Not Present- Myalgia. Neurological Not Present- Decreased Memory, Fainting, Headaches, Numbness, Seizures, Tingling, Tremor, Trouble walking and Weakness. Psychiatric Not Present- Anxiety, Bipolar, Change in Sleep Pattern, Depression, Fearful and Frequent crying. Endocrine Not Present- Cold Intolerance, Excessive Hunger, Hair Changes, Heat Intolerance, Hot flashes and New Diabetes. Hematology Not Present- Blood Thinners, Easy Bruising, Excessive bleeding, Gland problems, HIV and Persistent Infections.  [employer](Michelle R. Brooks CMA; 08/25/2015 9:55 AM) 08/25/2015 9:55 AM Weight: 261.5 lb Height: 61.5in Body Surface Area: 2.13 m Body Mass Index: 48.61 kg/m  BP: 146/88 (Sitting, Left Arm, Standard)       Physical Exam Stephanie Cordova(Swain Acree, MD; 08/25/2015 10:34 AM) General Mental  Status-Alert. General Appearance-Consistent with stated age. Hydration-Well hydrated. Voice-Normal.  Head and Neck Head-normocephalic, atraumatic with no lesions or palpable masses.  Eye Eyeball - Bilateral-Extraocular movements intact. Sclera/Conjunctiva - Bilateral-No scleral icterus.  Chest and Lung Exam Chest and lung exam reveals -quiet, even and easy respiratory effort with no use  of accessory muscles. Inspection Chest Wall - Normal. Back - normal.  Cardiovascular Cardiovascular examination reveals -normal heart sounds, regular rate and rhythm with no murmurs.  Abdomen Inspection Normal Exam - No Hernias. Palpation/Percussion Normal exam - Soft, Non Tender, No Rebound tenderness, No Rigidity (guarding) and No hepatosplenomegaly. Auscultation Normal exam - Bowel sounds normal.  Neurologic Neurologic evaluation reveals -alert and oriented x 3 with no impairment of recent or remote memory. Mental Status-Normal.  Musculoskeletal Normal Exam - Left-Upper Extremity Strength Normal and Lower Extremity Strength Normal. Normal Exam - Right-Upper Extremity Strength Normal, Lower Extremity Weakness.    Assessment & Plan (Dartanyan Deasis MD; 08/25/2015 10:34 AM) SYMPTOMATIC CHOLELITHIASIS (K80.20) Impression: 60-year-old female with symptomatic cholelithiasis  1. We will proceed to the operating room for a laparoscopic cholecystectomy 2. Risks and benefits were discussed with the patient to generally include, but not limited to: infection, bleeding, possible need for post op ERCP, damage to the bile ducts, bile leak, and possible need for further surgery. Alternatives were offered and described. All questions were answered and the patient voiced understanding of the procedure and wishes to proceed at this point with a laparoscopic cholecystectomy 

## 2015-09-06 NOTE — Interval H&P Note (Signed)
History and Physical Interval Note:  09/06/2015 9:40 AM  Stephanie Cordova  has presented today for surgery, with the diagnosis of gallstones  The various methods of treatment have been discussed with the patient and family. After consideration of risks, benefits and other options for treatment, the patient has consented to  Procedure(s): LAPAROSCOPIC CHOLECYSTECTOMY (N/A) as a surgical intervention .  The patient's history has been reviewed, patient examined, no change in status, stable for surgery.  I have reviewed the patient's chart and labs.  Questions were answered to the patient's satisfaction.     Marigene Ehlersamirez Jr., Jed LimerickArmando

## 2015-09-06 NOTE — Transfer of Care (Signed)
Immediate Anesthesia Transfer of Care Note  Patient: Stephanie SicilianMary L Edgin  Procedure(s) Performed: Procedure(s): LAPAROSCOPIC CHOLECYSTECTOMY (N/A)  Patient Location: PACU  Anesthesia Type:General  Level of Consciousness: awake, alert  and oriented  Airway & Oxygen Therapy: Patient Spontanous Breathing and Patient connected to face mask oxygen  Post-op Assessment: Report given to RN and Post -op Vital signs reviewed and stable  Post vital signs: Reviewed and stable  Last Vitals:  Vitals:   09/06/15 0709  BP: (!) 176/82  Pulse: 80  Resp: 20  Temp: 36.9 C    Last Pain:  Vitals:   09/06/15 0735  TempSrc:   PainSc: 3          Complications: No apparent anesthesia complications

## 2015-09-06 NOTE — Anesthesia Procedure Notes (Signed)
Procedure Name: Intubation Date/Time: 09/06/2015 9:53 AM Performed by: Dairl PonderJIANG, Sharron Petruska Pre-anesthesia Checklist: Patient identified, Emergency Drugs available, Suction available and Patient being monitored Patient Re-evaluated:Patient Re-evaluated prior to inductionOxygen Delivery Method: Circle system utilized Preoxygenation: Pre-oxygenation with 100% oxygen Intubation Type: IV induction Ventilation: Mask ventilation without difficulty Laryngoscope Size: Miller and 2 Grade View: Grade I Tube size: 7.0 mm Number of attempts: 1 Airway Equipment and Method: Stylet Placement Confirmation: ETT inserted through vocal cords under direct vision,  positive ETCO2 and breath sounds checked- equal and bilateral Secured at: 22 cm Tube secured with: Tape Dental Injury: Teeth and Oropharynx as per pre-operative assessment

## 2015-09-06 NOTE — Progress Notes (Signed)
Dr Leta JunglingEwell here-aware CBG was 234

## 2015-09-06 NOTE — Anesthesia Postprocedure Evaluation (Signed)
Anesthesia Post Note  Patient: Stephanie Cordova  Procedure(s) Performed: Procedure(s) (LRB): LAPAROSCOPIC CHOLECYSTECTOMY (N/A)  Patient location during evaluation: PACU Anesthesia Type: General Level of consciousness: awake and alert Pain management: pain level controlled Vital Signs Assessment: post-procedure vital signs reviewed and stable Respiratory status: spontaneous breathing, nonlabored ventilation, respiratory function stable and patient connected to nasal cannula oxygen Cardiovascular status: blood pressure returned to baseline and stable Postop Assessment: no signs of nausea or vomiting Anesthetic complications: no    Last Vitals:  Vitals:   09/06/15 1211 09/06/15 1215  BP: (!) 120/58 (!) 141/67  Pulse: 61 60  Resp: 18 18  Temp:      Last Pain:  Vitals:   09/06/15 1215  TempSrc:   PainSc: 4                  Stephanie Cordova

## 2015-09-07 ENCOUNTER — Encounter (HOSPITAL_COMMUNITY): Payer: Self-pay | Admitting: General Surgery

## 2015-10-26 ENCOUNTER — Ambulatory Visit (INDEPENDENT_AMBULATORY_CARE_PROVIDER_SITE_OTHER): Payer: 59 | Admitting: Family

## 2015-10-26 ENCOUNTER — Encounter: Payer: Self-pay | Admitting: Family

## 2015-10-26 ENCOUNTER — Other Ambulatory Visit (HOSPITAL_COMMUNITY)
Admission: RE | Admit: 2015-10-26 | Discharge: 2015-10-26 | Disposition: A | Discharge status: Home / Self Care | Payer: 59 | Source: Ambulatory Visit | Attending: Family | Admitting: Family

## 2015-10-26 VITALS — BP 173/94 | HR 79 | Temp 98.2°F | Resp 16 | Ht 61.5 in | Wt 258.8 lb

## 2015-10-26 DIAGNOSIS — I10 Essential (primary) hypertension: Secondary | ICD-10-CM

## 2015-10-26 DIAGNOSIS — L298 Other pruritus: Secondary | ICD-10-CM

## 2015-10-26 DIAGNOSIS — N760 Acute vaginitis: Secondary | ICD-10-CM | POA: Diagnosis not present

## 2015-10-26 DIAGNOSIS — N898 Other specified noninflammatory disorders of vagina: Secondary | ICD-10-CM

## 2015-10-26 MED ORDER — FUROSEMIDE 20 MG PO TABS: 20.0000 mg | ORAL_TABLET | Freq: Every day | ORAL | 0 refills | Status: DC | PRN

## 2015-10-26 MED ORDER — FLUCONAZOLE 150 MG PO TABS: 150.0000 mg | ORAL_TABLET | Freq: Once | ORAL | 0 refills | Status: AC

## 2015-10-26 MED ORDER — LISINOPRIL 10 MG PO TABS: 10.0000 mg | ORAL_TABLET | Freq: Every day | ORAL | 3 refills | Status: DC

## 2015-10-26 NOTE — Progress Notes (Signed)
Subjective:    Patient ID: Stephanie Cordova, female    DOB: 03-26-1955, 60 y.o.   MRN: [id number]  HPI  [name] is a 60 yr old female who presents today with c/o vaginal itching. Has had symptoms x 3 weeks. Not improved by otc monistat. Denies vaginal discharge.   HTN- Stopped maxide because it made her feel "awful." Reports that it makes her dizzy and nauseated. She reports some LE swelling since she stopped the maxide. Use a family member's lasix and it "worked great."   BP Readings from Last 3 Encounters:  10/26/15 (!) 173/94  09/06/15 (!) 141/67  09/05/15 (!) 177/86     Review of Systems    see HPI  Past Medical History:  Diagnosis Date  . Arthritis    b/l knees, bone on bone  . Diabetes mellitus type 2 in obese ([place]) 12/21/2012   takes Amaryl daily  . History of bronchitis 03/2015  . History of colon polyps    benign  . Hypertension    takes Metoprolol and Lisinopril daily  . Insomnia   . Pneumonia    hx of > 5 yrs ago     Social History   Social History  . Marital status: Widowed    Spouse name: N/A  . Number of children: N/A  . Years of education: N/A   Occupational History  . Customer service    Social History Main Topics  . Smoking status: Never Smoker  . Smokeless tobacco: Never Used  . Alcohol use No  . Drug use: No  . Sexual activity: No     Comment: lives alone, widowed in 2006   Other Topics Concern  . Not on file   Social History Narrative  . No narrative on file    Past Surgical History:  Procedure Laterality Date  . ABDOMINAL HYSTERECTOMY  1996  . BREAST BIOPSY    . CHOLECYSTECTOMY N/A 09/06/2015   Procedure: LAPAROSCOPIC CHOLECYSTECTOMY;  Surgeon: Ralene Ok, MD;  Location: [facility];  Service: General;  Laterality: N/A;  . COLONOSCOPY    . COLONOSCOPY WITH ESOPHAGOGASTRODUODENOSCOPY (EGD)    . KNEE ARTHROSCOPY Bilateral   . TONSILLECTOMY  1968  . TUBAL LIGATION      Family History  Problem Relation Age of Onset  .  Hypertension Mother   . Hyperlipidemia Mother   . Leukemia Mother   . Cancer Mother   . Hypertension Father   . Hyperlipidemia Father   . Diabetes Father   . Heart disease Father   . Alzheimer's disease Father   . Multiple sclerosis Son   . Heart disease Maternal Grandfather     Allergies  Allergen Reactions  . Losartan Palpitations  . Lexapro [Escitalopram] Other (See Comments)    swelling  . Sulfa Antibiotics Hives and Rash    "Burning" rash    Current Outpatient Prescriptions on File Prior to Visit  Medication Sig Dispense Refill  . Blood Glucose Monitoring Suppl (ONE TOUCH ULTRA SYSTEM KIT) w/Device KIT Use as directed twice daily to check blood sugar.  DX E11.9 1 each 0  . glimepiride (AMARYL) 4 MG tablet Take 1 tablet (4 mg total) by mouth 2 (two) times daily. 60 tablet 3  . Lancets MISC Check blood sugar twice daily. DX code E11.9 100 each 6  . metFORMIN (GLUCOPHAGE) 500 MG tablet Take 1,000 mg by mouth 2 (two) times daily with a meal.    . metoprolol tartrate (LOPRESSOR) 25 MG tablet Take  25 mg by mouth daily.    . naproxen sodium (ALEVE) 220 MG tablet Take 220 mg by mouth 2 (two) times daily as needed (for knee pain).    . ONE TOUCH ULTRA TEST test strip USE AS DIRECTED TWICE A DAY 100 each 2  . triamterene-hydrochlorothiazide (MAXZIDE-25) 37.5-25 MG tablet Take 1 tablet by mouth daily.     No current facility-administered medications on file prior to visit.     BP (!) 173/94 (BP Location: Right Arm, Cuff Size: Large)   Pulse 79   Temp 98.2 F (36.8 C) (Oral)   Resp 16   Ht 5' 1.5" (1.562 m)   Wt 258 lb 12.8 oz (117.4 kg)   LMP 01/29/1994   SpO2 98% Comment: room air  BMI 48.11 kg/m    Objective:   Physical Exam  Constitutional: She is oriented to person, place, and time. She appears well-developed and well-nourished.  Cardiovascular: Normal rate, regular rhythm and normal heart sounds.   No murmur heard. Pulmonary/Chest: Effort normal and breath sounds  normal. No respiratory distress. She has no wheezes.  Genitourinary:  Genitourinary Comments: Vaginal tissues are atrophic/dry  Musculoskeletal: She exhibits no edema.  Neurological: She is alert and oriented to person, place, and time.  Skin: Skin is warm and dry.  Psychiatric: She has a normal mood and affect. Her behavior is normal. Judgment and thought content normal.          Assessment & Plan:  Vaginitis- wet prep sent to lab. rx with empiric diflucan. If symptoms do not improve with diflucan, symptoms may be due to atrophic vaginitis.  Will see how wet prep looks.

## 2015-10-26 NOTE — Patient Instructions (Signed)
Take diflucan for possible yeast infection. For your blood pressure, remain off of maxide. Increase lisinopril from 5mg  ot 10mg . You may use furosemide once daily on an as needed basis for swelling.

## 2015-10-26 NOTE — Progress Notes (Signed)
Pre visit review using our clinic review tool, if applicable. No additional management support is needed unless otherwise documented below in the visit note. 

## 2015-10-26 NOTE — Assessment & Plan Note (Addendum)
Uncontrolled. Did not tolerate maxide- advised pt to remain off of maxide. Will increase lisinopril from 5mg  to 10mg . Advised pt to add lasix once daily PRN only. Plan a 2 week follow up appointment for re-evaluation of BP and follow up bmet.

## 2015-10-27 LAB — CERVICOVAGINAL ANCILLARY ONLY: WET PREP (BD AFFIRM): POSITIVE — AB

## 2015-10-28 ENCOUNTER — Telehealth: Payer: Self-pay | Admitting: Family

## 2015-10-28 DIAGNOSIS — M79645 Pain in left finger(s): Secondary | ICD-10-CM

## 2015-10-28 MED ORDER — METRONIDAZOLE 500 MG PO TABS: 500.0000 mg | ORAL_TABLET | Freq: Two times a day (BID) | ORAL | 0 refills | Status: DC

## 2015-10-28 NOTE — Telephone Encounter (Signed)
Please contact pt and let her know that wet prep confirms yeast. Diflucan she was given should cover this. Also shows bacterial vaginosis, rx sent for metronidazole, do not drink ETOH while taking.  Call if symptoms worsen or if symptoms do not improve.

## 2015-10-28 NOTE — Telephone Encounter (Signed)
Attempted to reach pt and left message to check myhcart acct. Message sent via mychart.

## 2015-10-30 ENCOUNTER — Other Ambulatory Visit: Payer: Self-pay | Admitting: Family Medicine

## 2015-10-31 ENCOUNTER — Other Ambulatory Visit: Payer: Self-pay | Admitting: Family Medicine

## 2015-11-02 MED ORDER — SITAGLIPTIN PHOSPHATE 100 MG PO TABS: 100.0000 mg | ORAL_TABLET | Freq: Every day | ORAL | 3 refills | Status: DC

## 2015-11-02 NOTE — Addendum Note (Signed)
Addended by: Sandford Craze'SULLIVAN, Maxwel Meadowcroft on: 11/02/2015 01:11 PM   Modules accepted: Orders

## 2015-11-02 NOTE — Addendum Note (Signed)
Addended by: Mervin KungFERGERSON, Deyjah Kindel A on: 11/02/2015 05:20 PM   Modules accepted: Orders

## 2015-11-02 NOTE — Telephone Encounter (Signed)
[name]-- please advise? 

## 2015-11-02 NOTE — Telephone Encounter (Signed)
BV is a common vaginal bacterial infection.  Often causes thin discharge and fishy odor. Easily treated with metronidazole.   She should be taking metformin 1000mg  (2 tabs) BID.  Also, add [place]uvia once daily.   I will send her to Dr. Pearletha ForgeHudnall to evaluate her thumb pain.

## 2015-11-09 ENCOUNTER — Ambulatory Visit: Payer: 59 | Admitting: Family

## 2015-11-14 ENCOUNTER — Ambulatory Visit (INDEPENDENT_AMBULATORY_CARE_PROVIDER_SITE_OTHER): Payer: 59 | Admitting: Family Medicine

## 2015-11-14 ENCOUNTER — Encounter: Payer: Self-pay | Admitting: Family Medicine

## 2015-11-14 VITALS — BP 137/82 | HR 83 | Ht 61.0 in | Wt 250.0 lb

## 2015-11-14 DIAGNOSIS — M25562 Pain in left knee: Secondary | ICD-10-CM | POA: Diagnosis not present

## 2015-11-14 DIAGNOSIS — M6530 Trigger finger, unspecified finger: Secondary | ICD-10-CM | POA: Diagnosis not present

## 2015-11-14 DIAGNOSIS — G8929 Other chronic pain: Secondary | ICD-10-CM

## 2015-11-14 MED ORDER — METHYLPREDNISOLONE ACETATE 40 MG/ML IJ SUSP
40.0000 mg | Freq: Once | INTRAMUSCULAR | Status: AC
  Administered 2015-11-14: 40 mg via INTRA_ARTICULAR

## 2015-11-14 NOTE — Patient Instructions (Signed)
You have a trigger finger of your thumb. Consider a cortisone injection or splinting of this area for 6-10 weeks if this becomes really painful - let me know if you want to proceed with either of these.  Your knee pain is due to arthritis. These are the different classes of medicine you can take for this: Tylenol 500mg  1-2 tabs three times a day for pain. Aleve 1-2 tabs twice a day with food Glucosamine sulfate 750mg  twice a day is a supplement that may help. Capsaicin, aspercreme, or biofreeze topically up to four times a day may also help with pain. Cortisone injections are an option - you were given this today. If cortisone injections do not help, there are different types of shots that may help but they take longer to take effect. It's important that you continue to stay active. Straight leg raises, knee extensions 3 sets of 10 once a day (add ankle weight if these become too easy). Consider physical therapy to strengthen muscles around the joint that hurts to take pressure off of the joint itself. Shoe inserts with good arch support may be helpful. Heat or ice 15 minutes at a time 3-4 times a day as needed to help with pain. Water aerobics and cycling with low resistance are the best two types of exercise for arthritis. Follow up with me in 6 weeks or as needed.

## 2015-11-15 ENCOUNTER — Ambulatory Visit: Payer: 59 | Admitting: Family Medicine

## 2015-11-16 DIAGNOSIS — M25562 Pain in left knee: Secondary | ICD-10-CM | POA: Insufficient documentation

## 2015-11-16 DIAGNOSIS — M6530 Trigger finger, unspecified finger: Secondary | ICD-10-CM | POA: Insufficient documentation

## 2015-11-16 NOTE — Assessment & Plan Note (Signed)
2/2 known DJD - told in past it was 'bone on bone.'  Injection given today.  Discussed tylenol, nsaids, glucosamine, topical medications.  F/u in 6 weeks or prn.  After informed written consent, patient was seated on exam table. Left knee was prepped with alcohol swab and utilizing anteromedial approach, patient's left knee was injected intraarticularly with 3:1 marcaine: depomedrol. Patient tolerated the procedure well without immediate complications.

## 2015-11-16 NOTE — Progress Notes (Signed)
PCP and consultation requested by: Penni Homans, MD  Subjective:   HPI: Patient is a 60 y.o. female here for left thumb and knee pain.  Patient reports for past month she's had problems bending her left thumb (at IP joint). No acute injury or trauma. Pain worse in morning. Feels like this is stuck. No swelling or bruising. Pain level 0/10 at rest, up to 6/10 and sharp with trying to bend this volar part of thumb. No skin changes, numbness. Also reports history of arthritis in her knees - gets injections intermittently and would like one today in her left knee.  Past Medical History:  Diagnosis Date  . Arthritis    b/l knees, bone on bone  . Diabetes mellitus type 2 in obese ([place]) 12/21/2012   takes Amaryl daily  . History of bronchitis 03/2015  . History of colon polyps    benign  . Hypertension    takes Metoprolol and Lisinopril daily  . Insomnia   . Pneumonia    hx of > 5 yrs ago    Current Outpatient Prescriptions on File Prior to Visit  Medication Sig Dispense Refill  . Blood Glucose Monitoring Suppl (ONE TOUCH ULTRA SYSTEM KIT) w/Device KIT Use as directed twice daily to check blood sugar.  DX E11.9 1 each 0  . furosemide (LASIX) 20 MG tablet Take 1 tablet (20 mg total) by mouth daily as needed for edema. 30 tablet 0  . glimepiride (AMARYL) 4 MG tablet Take 1 tablet (4 mg total) by mouth 2 (two) times daily. 60 tablet 3  . lisinopril (PRINIVIL,ZESTRIL) 10 MG tablet Take 1 tablet (10 mg total) by mouth daily. 30 tablet 3  . metFORMIN (GLUCOPHAGE) 500 MG tablet Take 1,000 mg by mouth 2 (two) times daily with a meal.    . metoprolol tartrate (LOPRESSOR) 25 MG tablet Take 25 mg by mouth daily.    . metroNIDAZOLE (FLAGYL) 500 MG tablet Take 1 tablet (500 mg total) by mouth 2 (two) times daily. 14 tablet 0  . naproxen sodium (ALEVE) 220 MG tablet Take 220 mg by mouth 2 (two) times daily as needed (for knee pain).    . ONE TOUCH ULTRA TEST test strip USE AS DIRECTED TWICE A  DAY 100 each 2  . ONETOUCH DELICA LANCETS 78I MISC CHECK BLOOD SUGAR TWICE DAILY. DX CODE E11.9 100 each 1  . sitaGLIPtin (JANUVIA) 100 MG tablet Take 1 tablet (100 mg total) by mouth daily. 30 tablet 3  . triamterene-hydrochlorothiazide (MAXZIDE-25) 37.5-25 MG tablet Take 1 tablet by mouth daily.     No current facility-administered medications on file prior to visit.     Past Surgical History:  Procedure Laterality Date  . ABDOMINAL HYSTERECTOMY  1996  . BREAST BIOPSY    . CHOLECYSTECTOMY N/A 09/06/2015   Procedure: LAPAROSCOPIC CHOLECYSTECTOMY;  Surgeon: Ralene Ok, MD;  Location: [facility];  Service: General;  Laterality: N/A;  . COLONOSCOPY    . COLONOSCOPY WITH ESOPHAGOGASTRODUODENOSCOPY (EGD)    . KNEE ARTHROSCOPY Bilateral   . TONSILLECTOMY  1968  . TUBAL LIGATION      Allergies  Allergen Reactions  . Losartan Palpitations  . Lexapro [Escitalopram] Other (See Comments)    swelling  . Sulfa Antibiotics Hives and Rash    "Burning" rash    Social History   Social History  . Marital status: Widowed    Spouse name: N/A  . Number of children: N/A  . Years of education: N/A   Occupational History  .  Customer service    Social History Main Topics  . Smoking status: Never Smoker  . Smokeless tobacco: Never Used  . Alcohol use No  . Drug use: No  . Sexual activity: No     Comment: lives alone, widowed in 2006   Other Topics Concern  . Not on file   Social History Narrative  . No narrative on file    Family History  Problem Relation Age of Onset  . Hypertension Mother   . Hyperlipidemia Mother   . Leukemia Mother   . Cancer Mother   . Hypertension Father   . Hyperlipidemia Father   . Diabetes Father   . Heart disease Father   . Alzheimer's disease Father   . Multiple sclerosis Son   . Heart disease Maternal Grandfather     BP 137/82   Pulse 83   Ht _0  (1.549 m)   Wt 250 lb (113.4 kg)   LMP 01/29/1994   BMI 47.24 kg/m   Review of  Systems: See HPI above.    Objective:  Physical Exam:  Gen: NAD, comfortable in exam room  Left thumb: No gross deformity, swelling, bruising. Mild tenderness at A1 pulley reproducing her pain. No tenderness at IP joint, 1st cmc, 1st dorsal compartment, carpal tunnel. FROM at IP joint but painful especially with active flexion. NVI distally.  Left knee: No gross deformity, ecchymoses, swelling. Mild TTP medial and lateral joint lines. FROM. Negative ant/post drawers. Negative valgus/varus testing. Negative lachmanns. Negative mcmurrays, apleys, patellar apprehension. NV intact distally.    Assessment & Plan:  1. Left trigger thumb - discussed injection, splinting - she will think about these but declined for now.  2. Left knee pain - 2/2 known DJD - told in past it was 'bone on bone.'  Injection given today.  Discussed tylenol, nsaids, glucosamine, topical medications.  F/u in 6 weeks or prn.  After informed written consent, patient was seated on exam table. Left knee was prepped with alcohol swab and utilizing anteromedial approach, patient's left knee was injected intraarticularly with 3:1 marcaine: depomedrol. Patient tolerated the procedure well without immediate complications.

## 2015-11-16 NOTE — Assessment & Plan Note (Signed)
discussed injection, splinting - she will think about these but declined for now.

## 2015-11-21 ENCOUNTER — Other Ambulatory Visit: Payer: Self-pay | Admitting: Family

## 2015-11-29 ENCOUNTER — Encounter: Payer: Self-pay | Admitting: Family Medicine

## 2015-11-29 ENCOUNTER — Other Ambulatory Visit: Payer: Self-pay | Admitting: Family Medicine

## 2015-11-29 MED ORDER — FLUCONAZOLE 150 MG PO TABS: ORAL_TABLET | ORAL | 0 refills | Status: DC

## 2015-12-05 ENCOUNTER — Other Ambulatory Visit: Payer: Self-pay | Admitting: Family Medicine

## 2015-12-06 ENCOUNTER — Ambulatory Visit (INDEPENDENT_AMBULATORY_CARE_PROVIDER_SITE_OTHER): Payer: 59 | Admitting: Family Medicine

## 2015-12-06 ENCOUNTER — Encounter: Payer: Self-pay | Admitting: Family Medicine

## 2015-12-06 VITALS — BP 142/90 | HR 79 | Temp 98.8°F | Ht 61.0 in | Wt 260.1 lb

## 2015-12-06 DIAGNOSIS — E6609 Other obesity due to excess calories: Secondary | ICD-10-CM

## 2015-12-06 DIAGNOSIS — K760 Fatty (change of) liver, not elsewhere classified: Secondary | ICD-10-CM

## 2015-12-06 DIAGNOSIS — E669 Obesity, unspecified: Secondary | ICD-10-CM

## 2015-12-06 DIAGNOSIS — Z0000 Encounter for general adult medical examination without abnormal findings: Secondary | ICD-10-CM

## 2015-12-06 DIAGNOSIS — I10 Essential (primary) hypertension: Secondary | ICD-10-CM

## 2015-12-06 DIAGNOSIS — E1169 Type 2 diabetes mellitus with other specified complication: Secondary | ICD-10-CM | POA: Diagnosis not present

## 2015-12-06 MED ORDER — DULAGLUTIDE 0.75 MG/0.5ML ~~LOC~~ SOAJ: 0.7500 mg | SUBCUTANEOUS | 5 refills | Status: DC

## 2015-12-06 MED ORDER — TRAMADOL HCL 50 MG PO TABS: 50.0000 mg | ORAL_TABLET | Freq: Three times a day (TID) | ORAL | 0 refills | Status: DC | PRN

## 2015-12-06 NOTE — Patient Instructions (Signed)

## 2015-12-06 NOTE — Progress Notes (Signed)
Pre visit review using our clinic review tool, if applicable. No additional management support is needed unless otherwise documented below in the visit note. 

## 2015-12-09 ENCOUNTER — Other Ambulatory Visit (INDEPENDENT_AMBULATORY_CARE_PROVIDER_SITE_OTHER): Payer: 59

## 2015-12-09 DIAGNOSIS — E1169 Type 2 diabetes mellitus with other specified complication: Secondary | ICD-10-CM

## 2015-12-09 DIAGNOSIS — Z0000 Encounter for general adult medical examination without abnormal findings: Secondary | ICD-10-CM | POA: Diagnosis not present

## 2015-12-09 DIAGNOSIS — I10 Essential (primary) hypertension: Secondary | ICD-10-CM

## 2015-12-09 DIAGNOSIS — E669 Obesity, unspecified: Secondary | ICD-10-CM | POA: Diagnosis not present

## 2015-12-09 LAB — LIPID PANEL
CHOLESTEROL: 156 mg/dL (ref 0–200)
HDL: 63 mg/dL (ref >39.00)
LDL Cholesterol: 75 mg/dL (ref 0–99)
NonHDL: 93.19
TRIGLYCERIDES: 93 mg/dL (ref 0.0–149.0)
Total CHOL/HDL Ratio: 2
VLDL: 18.6 mg/dL (ref 0.0–40.0)

## 2015-12-09 LAB — COMPREHENSIVE METABOLIC PANEL
ALBUMIN: 3.8 g/dL (ref 3.5–5.2)
ALK PHOS: 76 U/L (ref 39–117)
ALT: 49 U/L — ABNORMAL HIGH (ref 0–35)
AST: 46 U/L — AB (ref 0–37)
BUN: 12 mg/dL (ref 6–23)
CHLORIDE: 105 meq/L (ref 96–112)
CO2: 25 mEq/L (ref 19–32)
CREATININE: 0.71 mg/dL (ref 0.40–1.20)
Calcium: 9.1 mg/dL (ref 8.4–10.5)
GFR: 89.34 mL/min (ref >60.00)
GLUCOSE: 245 mg/dL — AB (ref 70–99)
Potassium: 4.1 mEq/L (ref 3.5–5.1)
SODIUM: 138 meq/L (ref 135–145)
TOTAL PROTEIN: 6.2 g/dL (ref 6.0–8.3)
Total Bilirubin: 0.9 mg/dL (ref 0.2–1.2)

## 2015-12-09 LAB — CBC
HEMATOCRIT: 41 % (ref 36.0–46.0)
HEMOGLOBIN: 14.1 g/dL (ref 12.0–15.0)
MCHC: 34.3 g/dL (ref 30.0–36.0)
MCV: 86.1 fl (ref 78.0–100.0)
PLATELETS: 132 10*3/uL — AB (ref 150.0–400.0)
RBC: 4.77 Mil/uL (ref 3.87–5.11)
RDW: 14.3 % (ref 11.5–15.5)
WBC: 5.8 10*3/uL (ref 4.0–10.5)

## 2015-12-09 LAB — MICROALBUMIN / CREATININE URINE RATIO
Creatinine,U: 203.3 mg/dL
MICROALB/CREAT RATIO: 1.3 mg/g (ref 0.0–30.0)
Microalb, Ur: 2.6 mg/dL — ABNORMAL HIGH (ref 0.0–1.9)

## 2015-12-09 LAB — HEMOGLOBIN A1C: HEMOGLOBIN A1C: 10 % — AB (ref 4.6–6.5)

## 2015-12-09 LAB — TSH: TSH: 2.06 u[IU]/mL (ref 0.35–4.50)

## 2015-12-11 NOTE — Assessment & Plan Note (Signed)
Encouraged DASH diet, decrease po intake and increase exercise as tolerated. Needs 7-8 hours of sleep nightly. Avoid trans fats, eat small, frequent meals every 4-5 hours with lean proteins, complex carbs and healthy fats. Minimize simple carbs 

## 2015-12-11 NOTE — Assessment & Plan Note (Signed)
Well controlled, no changes to meds. Encouraged heart healthy diet such as the DASH diet and exercise as tolerated.  °

## 2015-12-11 NOTE — Progress Notes (Signed)
Patient ID: Stephanie Cordova, female   DOB: [birth year], 60 y.o.   MRN: [id number]   Subjective:    Patient ID: Stephanie Cordova, female    DOB: Jun 22, 1955, 60 y.o.   MRN: [id number]  Chief Complaint  Patient presents with  . Follow-up    HPI Patient is in today for follow up. She notes increased blood sugars and feels they went higher after we added Januvia so she stopped it. She reports a high of 500 on Januvia and a hi of 239 since stopping it. She endorses polyuria, polydipsia and fatigue. Has been noting an increase in right knee pain as her left knee got better. No redness or swelling. Denies CP/palp/SOB/HA/congestion/fevers/GI or GU c/o. Taking meds as prescribed  Past Medical History:  Diagnosis Date  . Arthritis    b/l knees, bone on bone  . Diabetes mellitus type 2 in obese ([place]) 12/21/2012   takes Amaryl daily  . History of bronchitis 03/2015  . History of colon polyps    benign  . Hypertension    takes Metoprolol and Lisinopril daily  . Insomnia   . Pneumonia    hx of > 5 yrs ago    Past Surgical History:  Procedure Laterality Date  . ABDOMINAL HYSTERECTOMY  1996  . BREAST BIOPSY    . CHOLECYSTECTOMY N/A 09/06/2015   Procedure: LAPAROSCOPIC CHOLECYSTECTOMY;  Surgeon: Ralene Ok, MD;  Location: [facility];  Service: General;  Laterality: N/A;  . COLONOSCOPY    . COLONOSCOPY WITH ESOPHAGOGASTRODUODENOSCOPY (EGD)    . KNEE ARTHROSCOPY Bilateral   . TONSILLECTOMY  1968  . TUBAL LIGATION      Family History  Problem Relation Age of Onset  . Hypertension Mother   . Hyperlipidemia Mother   . Leukemia Mother   . Cancer Mother   . Hypertension Father   . Hyperlipidemia Father   . Diabetes Father   . Heart disease Father   . Alzheimer's disease Father   . Multiple sclerosis Son   . Heart disease Maternal Grandfather     Social History   Social History  . Marital status: Widowed    Spouse name: N/A  . Number of children: N/A  . Years of education: N/A   Occupational  History  . Customer service    Social History Main Topics  . Smoking status: Never Smoker  . Smokeless tobacco: Never Used  . Alcohol use No  . Drug use: No  . Sexual activity: No     Comment: lives alone, widowed in 2006   Other Topics Concern  . Not on file   Social History Narrative  . No narrative on file    Outpatient Medications Prior to Visit  Medication Sig Dispense Refill  . Blood Glucose Monitoring Suppl (ONE TOUCH ULTRA SYSTEM KIT) w/Device KIT Use as directed twice daily to check blood sugar.  DX E11.9 1 each 0  . fluconazole (DIFLUCAN) 150 MG tablet Take 1 tablet by mouth once a week for 2 weeks. 2 tablet 0  . furosemide (LASIX) 20 MG tablet TAKE 1 TABLET (20 MG TOTAL) BY MOUTH DAILY AS NEEDED FOR EDEMA. 30 tablet 0  . glimepiride (AMARYL) 4 MG tablet Take 1 tablet (4 mg total) by mouth 2 (two) times daily. 60 tablet 3  . lisinopril (PRINIVIL,ZESTRIL) 10 MG tablet Take 1 tablet (10 mg total) by mouth daily. 30 tablet 3  . metFORMIN (GLUCOPHAGE) 500 MG tablet Take 1,000 mg by mouth 2 (two) times daily  with a meal.    . metFORMIN (GLUCOPHAGE) 500 MG tablet TAKE 2 TABLETS BY MOUTH TWICE DAILY WITH A MEAL 120 tablet 0  . metoprolol tartrate (LOPRESSOR) 25 MG tablet Take 25 mg by mouth daily.    . metroNIDAZOLE (FLAGYL) 500 MG tablet Take 1 tablet (500 mg total) by mouth 2 (two) times daily. 14 tablet 0  . naproxen sodium (ALEVE) 220 MG tablet Take 220 mg by mouth 2 (two) times daily as needed (for knee pain).    . ONE TOUCH ULTRA TEST test strip USE AS DIRECTED TWICE A DAY 100 each 2  . ONETOUCH DELICA LANCETS 37C MISC CHECK BLOOD SUGAR TWICE DAILY. DX CODE E11.9 100 each 1  . triamterene-hydrochlorothiazide (MAXZIDE-25) 37.5-25 MG tablet Take 1 tablet by mouth daily.    . sitaGLIPtin (JANUVIA) 100 MG tablet Take 1 tablet (100 mg total) by mouth daily. (Patient not taking: Reported on 12/06/2015) 30 tablet 3   No facility-administered medications prior to visit.      Allergies  Allergen Reactions  . Losartan Palpitations  . Lexapro [Escitalopram] Other (See Comments)    swelling  . Sulfa Antibiotics Hives and Rash    "Burning" rash    Review of Systems  Constitutional: Positive for malaise/fatigue. Negative for fever.  HENT: Negative for congestion.   Eyes: Negative for blurred vision.  Respiratory: Negative for shortness of breath.   Cardiovascular: Negative for chest pain, palpitations and leg swelling.  Gastrointestinal: Negative for abdominal pain, blood in stool and nausea.  Genitourinary: Positive for frequency. Negative for dysuria.  Musculoskeletal: Negative for falls.  Skin: Negative for rash.  Neurological: Negative for dizziness, loss of consciousness and headaches.  Endo/Heme/Allergies: Negative for environmental allergies.  Psychiatric/Behavioral: Negative for depression. The patient is not nervous/anxious.        Objective:    Physical Exam  Constitutional: She is oriented to person, place, and time. She appears well-developed and well-nourished. No distress.  HENT:  Head: Normocephalic and atraumatic.  Nose: Nose normal.  Eyes: Right eye exhibits no discharge. Left eye exhibits no discharge.  Neck: Normal range of motion. Neck supple.  Cardiovascular: Normal rate and regular rhythm.   No murmur heard. Pulmonary/Chest: Effort normal and breath sounds normal.  Abdominal: Soft. Bowel sounds are normal. There is no tenderness.  Musculoskeletal: She exhibits no edema.  Neurological: She is alert and oriented to person, place, and time.  Skin: Skin is warm and dry.  Psychiatric: She has a normal mood and affect.  Nursing note and vitals reviewed.   BP (!) 142/90 (BP Location: Left Arm, Patient Position: Sitting, Cuff Size: Large)   Pulse 79   Temp 98.8 F (37.1 C) (Oral)   Ht 5' 1"  (1.549 m)   Wt 260 lb 2 oz (118 kg)   LMP 01/29/1994   SpO2 96%   BMI 49.15 kg/m  Wt Readings from Last 3 Encounters:  12/06/15  260 lb 2 oz (118 kg)  11/14/15 250 lb (113.4 kg)  10/26/15 258 lb 12.8 oz (117.4 kg)     Lab Results  Component Value Date   WBC 5.8 12/09/2015   HGB 14.1 12/09/2015   HCT 41.0 12/09/2015   PLT 132.0 (L) 12/09/2015   GLUCOSE 245 (H) 12/09/2015   CHOL 156 12/09/2015   TRIG 93.0 12/09/2015   HDL 63.00 12/09/2015   LDLCALC 75 12/09/2015   ALT 49 (H) 12/09/2015   AST 46 (H) 12/09/2015   NA 138 12/09/2015   K 4.1  12/09/2015   CL 105 12/09/2015   CREATININE 0.71 12/09/2015   BUN 12 12/09/2015   CO2 25 12/09/2015   TSH 2.06 12/09/2015   HGBA1C 10.0 (H) 12/09/2015   MICROALBUR 2.6 (H) 12/09/2015    Lab Results  Component Value Date   TSH 2.06 12/09/2015   Lab Results  Component Value Date   WBC 5.8 12/09/2015   HGB 14.1 12/09/2015   HCT 41.0 12/09/2015   MCV 86.1 12/09/2015   PLT 132.0 (L) 12/09/2015   Lab Results  Component Value Date   NA 138 12/09/2015   K 4.1 12/09/2015   CO2 25 12/09/2015   GLUCOSE 245 (H) 12/09/2015   BUN 12 12/09/2015   CREATININE 0.71 12/09/2015   BILITOT 0.9 12/09/2015   ALKPHOS 76 12/09/2015   AST 46 (H) 12/09/2015   ALT 49 (H) 12/09/2015   PROT 6.2 12/09/2015   ALBUMIN 3.8 12/09/2015   CALCIUM 9.1 12/09/2015   ANIONGAP 9 09/05/2015   GFR 89.34 12/09/2015   Lab Results  Component Value Date   CHOL 156 12/09/2015   Lab Results  Component Value Date   HDL 63.00 12/09/2015   Lab Results  Component Value Date   LDLCALC 75 12/09/2015   Lab Results  Component Value Date   TRIG 93.0 12/09/2015   Lab Results  Component Value Date   CHOLHDL 2 12/09/2015   Lab Results  Component Value Date   HGBA1C 10.0 (H) 12/09/2015       Assessment & Plan:   Problem List Items Addressed This Visit    Obesity    Encouraged DASH diet, decrease po intake and increase exercise as tolerated. Needs 7-8 hours of sleep nightly. Avoid trans fats, eat small, frequent meals every 4-5 hours with lean proteins, complex carbs and healthy fats.  Minimize simple carbs      Relevant Medications   Dulaglutide (TRULICITY) 9.32 IZ/1.2WP SOPN   Preventative health care   Relevant Orders   Comp Met (CMET) (Completed)   Urine Microalbumin w/creat. ratio (Completed)   CBC (Completed)   TSH (Completed)   Lipid Profile (Completed)   HgB A1c (Completed)   HTN (hypertension)    Well controlled, no changes to meds. Encouraged heart healthy diet such as the DASH diet and exercise as tolerated.       Relevant Orders   Comp Met (CMET) (Completed)   Urine Microalbumin w/creat. ratio (Completed)   CBC (Completed)   TSH (Completed)   Lipid Profile (Completed)   HgB A1c (Completed)   Diabetes mellitus type 2 in obese ([facility]) - Primary    hgba1c elevated, minimize simple carbs. Increase exercise as tolerated. Continue current meds. Add Trulicity and referral to endocrinology       Relevant Medications   Dulaglutide (TRULICITY) 8.09 XI/3.3AS SOPN   Other Relevant Orders   Ambulatory referral to Endocrinology   Comp Met (CMET) (Completed)   Urine Microalbumin w/creat. ratio (Completed)   CBC (Completed)   TSH (Completed)   Lipid Profile (Completed)   HgB A1c (Completed)   Fatty infiltration of liver    Minimize simple carbs and fatty foods. Stay active         I have discontinued Stephanie Cordova's triamterene-hydrochlorothiazide. I am also having her start on Dulaglutide and traMADol. Additionally, I am having her maintain her [place] KIT, ONE TOUCH ULTRA TEST, glimepiride, metoprolol tartrate, naproxen sodium, metFORMIN, lisinopril, metroNIDAZOLE, ONETOUCH DELICA LANCETS 50N, sitaGLIPtin, furosemide, fluconazole, and metFORMIN.  Meds ordered this  encounter  Medications  . Dulaglutide (TRULICITY) 5.52 ZV/4.7FT SOPN    Sig: Inject 0.75 mg into the skin once a week.    Dispense:  2 mL    Refill:  5  . traMADol (ULTRAM) 50 MG tablet    Sig: Take 1 tablet (50 mg total) by mouth every 8 (eight) hours as needed.    Dispense:   30 tablet    Refill:  0     Penni Homans, MD

## 2015-12-11 NOTE — Assessment & Plan Note (Addendum)
hgba1c elevated, minimize simple carbs. Increase exercise as tolerated. Continue current meds. Add Trulicity and referral to endocrinology

## 2015-12-11 NOTE — Assessment & Plan Note (Signed)
Minimize simple carbs and fatty foods. Stay active

## 2015-12-20 ENCOUNTER — Other Ambulatory Visit: Payer: Self-pay | Admitting: Family Medicine

## 2015-12-27 ENCOUNTER — Other Ambulatory Visit: Payer: Self-pay | Admitting: Orthopedic Surgery

## 2015-12-27 DIAGNOSIS — M25562 Pain in left knee: Secondary | ICD-10-CM

## 2016-01-08 NOTE — Progress Notes (Signed)
Subjective:    Patient ID: Stephanie Cordova, female    DOB: August 19, 1955, 60 y.o.   MRN: [id number]  HPI pt is referred by Dr Charlett Blake, for diabetes.  Pt states DM was dx'ed in 2015 ; she has mild if any neuropathy of the lower extremities; she is unaware of any associated chronic complications; she has never been on insulin; pt says her diet and exercise are poor; she has never had GDM, pancreatitis, severe hypoglycemia or DKA.  She says cbg's vary from 218-500.  She takes trulicity and amaryl.  She stopped metformin, due to lack of effect.   Past Medical History:  Diagnosis Date  . Arthritis    b/l knees, bone on bone  . Diabetes mellitus type 2 in obese ([place]) 12/21/2012   takes Amaryl daily  . History of bronchitis 03/2015  . History of colon polyps    benign  . Hypertension    takes Metoprolol and Lisinopril daily  . Insomnia   . Pneumonia    hx of > 5 yrs ago    Past Surgical History:  Procedure Laterality Date  . ABDOMINAL HYSTERECTOMY  1996  . BREAST BIOPSY    . CHOLECYSTECTOMY N/A 09/06/2015   Procedure: LAPAROSCOPIC CHOLECYSTECTOMY;  Surgeon: Ralene Ok, MD;  Location: [facility];  Service: General;  Laterality: N/A;  . COLONOSCOPY    . COLONOSCOPY WITH ESOPHAGOGASTRODUODENOSCOPY (EGD)    . KNEE ARTHROSCOPY Bilateral   . TONSILLECTOMY  1968  . TUBAL LIGATION      Social History   Social History  . Marital status: Widowed    Spouse name: N/A  . Number of children: N/A  . Years of education: N/A   Occupational History  . Customer service    Social History Main Topics  . Smoking status: Never Smoker  . Smokeless tobacco: Never Used  . Alcohol use No  . Drug use: No  . Sexual activity: No     Comment: lives alone, widowed in 2006   Other Topics Concern  . Not on file   Social History Narrative  . No narrative on file    Current Outpatient Prescriptions on File Prior to Visit  Medication Sig Dispense Refill  . Blood Glucose Monitoring Suppl (ONE TOUCH ULTRA  SYSTEM KIT) w/Device KIT Use as directed twice daily to check blood sugar.  DX E11.9 1 each 0  . Dulaglutide (TRULICITY) 3.53 IR/4.4RX SOPN Inject 0.75 mg into the skin once a week. 2 mL 5  . furosemide (LASIX) 20 MG tablet TAKE 1 TABLET (20 MG TOTAL) BY MOUTH DAILY AS NEEDED FOR EDEMA. 30 tablet 0  . lisinopril (PRINIVIL,ZESTRIL) 10 MG tablet Take 1 tablet (10 mg total) by mouth daily. 30 tablet 3  . metoprolol tartrate (LOPRESSOR) 25 MG tablet Take 25 mg by mouth daily.    . naproxen sodium (ALEVE) 220 MG tablet Take 220 mg by mouth 2 (two) times daily as needed (for knee pain).    . ONE TOUCH ULTRA TEST test strip USE AS DIRECTED TWICE A DAY 100 each 2  . ONETOUCH DELICA LANCETS 54M MISC CHECK BLOOD SUGAR TWICE DAILY. DX CODE E11.9 100 each 1  . traMADol (ULTRAM) 50 MG tablet Take 1 tablet (50 mg total) by mouth every 8 (eight) hours as needed. 30 tablet 0  . fluconazole (DIFLUCAN) 150 MG tablet Take 1 tablet by mouth once a week for 2 weeks. (Patient not taking: Reported on 01/10/2016) 2 tablet 0   No current  facility-administered medications on file prior to visit.     Allergies  Allergen Reactions  . Losartan Palpitations  . Lexapro [Escitalopram] Other (See Comments)    swelling  . Sulfa Antibiotics Hives and Rash    "Burning" rash    Family History  Problem Relation Age of Onset  . Hypertension Mother   . Hyperlipidemia Mother   . Leukemia Mother   . Cancer Mother   . Hypertension Father   . Hyperlipidemia Father   . Diabetes Father   . Heart disease Father   . Alzheimer's disease Father   . Multiple sclerosis Son   . Heart disease Maternal Grandfather     BP 136/74   Pulse 67   Ht 5' 1"  (1.549 m)   Wt 260 lb (117.9 kg)   LMP 01/29/1994   SpO2 97%   BMI 49.13 kg/m    Review of Systems denies blurry vision, headache, chest pain, sob, n/v, urinary frequency, muscle cramps, excessive diaphoresis, depression, cold intolerance, rhinorrhea, and easy bruising.  She  has weight gain.     Objective:   Physical Exam VS: see vs page GEN: no distress HEAD: head: no deformity eyes: no periorbital swelling, no proptosis external nose and ears are normal mouth: no lesion seen NECK: supple, thyroid is not enlarged CHEST WALL: no deformity LUNGS: clear to auscultation CV: reg rate and rhythm, no murmur ABD: abdomen is soft, nontender.  no hepatosplenomegaly.  not distended.  no hernia MUSCULOSKELETAL: muscle bulk and strength are grossly normal.  no obvious joint swelling.  gait is normal and steady EXTEMITIES: no deformity.  no ulcer on the feet.  feet are of normal color and temp.  Trace bilat leg edema.  There is bilateral onychomycosis of the toenails.  PULSES: dorsalis pedis intact bilat.  no carotid bruit NEURO:  cn 2-12 grossly intact.   readily moves all 4's.  sensation is intact to touch on the feet SKIN:  Normal texture and temperature.  No rash or suspicious lesion is visible.   NODES:  None palpable at the neck PSYCH: alert, well-oriented.  Does not appear anxious nor depressed.   i personally reviewed electrocardiogram tracing (09/05/15):  Indication: preop Impression: NS-TWA  Lab Results  Component Value Date   HGBA1C 10.0 (H) 12/09/2015   Lab Results  Component Value Date   CREATININE 0.71 12/09/2015   BUN 12 12/09/2015   NA 138 12/09/2015   K 4.1 12/09/2015   CL 105 12/09/2015   CO2 25 12/09/2015   Lab Results  Component Value Date   MICROALBUR 2.6 (H) 12/09/2015   I have reviewed outside records, and summarized: Pt was noted to have severely elevated a1c, and referred here.  She was noted to have worsening hyperglycemia, with symptoms, despite addition of januvia.     Assessment & Plan:  Type 2 DM: Severe exacerbation.  We discussed.  She agrees to take insulin.   Patient is advised the following:  Patient Instructions  good diet and exercise significantly improve the control of your diabetes.  please let me know if you  wish to be referred to a dietician.  high blood sugar is very risky to your health.  you should see an eye doctor and dentist every year.  It is very important to get all recommended vaccinations.  Controlling your blood pressure and cholesterol drastically reduces the damage diabetes does to your body.  Those who smoke should quit.  Please discuss these with your doctor.  check your  blood sugar twice a day.  vary the time of day when you check, between before the 3 meals, and at bedtime.  also check if you have symptoms of your blood sugar being too high or too low.  please keep a record of the readings and bring it to your next appointment here (or you can bring the meter itself).  You can write it on any piece of paper.  please call us sooner if your blood sugar goes below 70, or if you have a lot of readings over 200.  I have sent a prescription to your pharmacy, to add humalog. This will replace the glimepiride.  Please call next week, to tell us how the blood sugar is doing.   Please come back for a follow-up appointment in 1 month.      Hypoglycemia  Hypoglycemia is when the sugar (glucose) level in the blood is too low. Symptoms of low blood sugar may include:  Feeling:  Hungry.  Worried or nervous (anxious).  Sweaty and clammy.  Confused.  Dizzy.  Sleepy.  Sick to your stomach (nauseous).  Having:  A fast heartbeat.  A headache.  A change in your vision.  Jerky movements that you cannot control (seizure).  Nightmares.  Tingling or no feeling (numbness) around the mouth, lips, or tongue.  Having trouble with:  Talking.  Paying attention (concentrating).  Moving (coordination).  Sleeping.  Shaking.  Passing out (fainting).  Getting upset easily (irritability). Low blood sugar can happen to people who have diabetes and people who do not have diabetes. Low blood sugar can happen quickly, and it can be an emergency. Treating Low Blood Sugar  Low blood  sugar is often treated by eating or drinking something sugary right away. If you can think clearly and swallow safely, follow the 15:15 rule:  Take 15 grams of a fast-acting carb (carbohydrate). Some fast-acting carbs are:  1 tube of glucose gel.  3 sugar tablets (glucose pills).  6-8 pieces of hard candy.  4 oz (120 mL) of fruit juice.  4 oz (120 mL) of regular (not diet) soda.  Check your blood sugar 15 minutes after you take the carb.  If your blood sugar is still at or below 70 mg/dL (3.9 mmol/L), take 15 grams of a carb again.  If your blood sugar does not go above 70 mg/dL (3.9 mmol/L) after 3 tries, get help right away.  After your blood sugar goes back to normal, eat a meal or a snack within 1 hour. Treating Very Low Blood Sugar  If your blood sugar is at or below 54 mg/dL (3 mmol/L), you have very low blood sugar (severe hypoglycemia). This is an emergency. Do not wait to see if the symptoms will go away. Get medical help right away. Call your local emergency services (911 in the [place] Do not drive yourself to the hospital. If you have very low blood sugar and you cannot eat or drink, you may need a glucagon shot (injection). A family member or friend should learn how to check your blood sugar and how to give you a glucagon shot. Ask your doctor if you need to have a glucagon shot kit at home. Follow these instructions at home: General instructions  Avoid any diets that cause you to not eat enough food. Talk with your doctor before you start any new diet.  Take over-the-counter and prescription medicines only as told by your doctor.  Limit alcohol to no more than 1 drink  per day for nonpregnant women and 2 drinks per day for men. One drink equals 12 oz of beer, 5 oz of wine, or 1 oz of hard liquor.  Keep all follow-up visits as told by your doctor. This is important. If You Have Diabetes:   Make sure you know the symptoms of low blood sugar.  Always keep a source of  sugar with you, such as:  Sugar.  Sugar tablets.  Glucose gel.  Fruit juice.  Regular soda (not diet soda).  Milk.  Hard candy.  Honey.  Take your medicines as told.  Follow your exercise and meal plan.  Eat on time. Do not skip meals.  Follow your sick day plan when you cannot eat or drink normally. Make this plan ahead of time with your doctor.  Check your blood sugar as often as told by your doctor. Always check before and after exercise.  Share your diabetes care plan with:  Your work or school.  People you live with.  Check your pee (urine) for ketones:  When you are sick.  As told by your doctor.  Carry a card or wear jewelry that says you have diabetes. If You Have Low Blood Sugar From Other Causes:   Check your blood sugar as often as told by your doctor.  Follow instructions from your doctor about what you cannot eat or drink. Contact a doctor if:  You have trouble keeping your blood sugar in your target range.  You have low blood sugar often. Get help right away if:  You still have symptoms after you eat or drink something sugary.  Your blood sugar is at or below 54 mg/dL (3 mmol/L).  You have jerky movements that you cannot control.  You pass out. These symptoms may be an emergency. Do not wait to see if the symptoms will go away. Get medical help right away. Call your local emergency services (911 in the [place] Do not drive yourself to the hospital.  This information is not intended to replace advice given to you by your health care provider. Make sure you discuss any questions you have with your health care provider. Document Released: 04/11/2009 Document Revised: 06/23/2015 Document Reviewed: 02/18/2015 Elsevier Interactive Patient Education  2017 [employer].

## 2016-01-10 ENCOUNTER — Ambulatory Visit (INDEPENDENT_AMBULATORY_CARE_PROVIDER_SITE_OTHER): Payer: 59 | Admitting: Endocrinology

## 2016-01-10 ENCOUNTER — Encounter: Payer: Self-pay | Admitting: Endocrinology

## 2016-01-10 ENCOUNTER — Other Ambulatory Visit: Payer: Self-pay | Admitting: Family Medicine

## 2016-01-10 VITALS — BP 136/74 | HR 67 | Ht 61.0 in | Wt 260.0 lb

## 2016-01-10 DIAGNOSIS — E1169 Type 2 diabetes mellitus with other specified complication: Secondary | ICD-10-CM

## 2016-01-10 DIAGNOSIS — E119 Type 2 diabetes mellitus without complications: Secondary | ICD-10-CM

## 2016-01-10 DIAGNOSIS — E669 Obesity, unspecified: Principal | ICD-10-CM

## 2016-01-10 DIAGNOSIS — Z794 Long term (current) use of insulin: Secondary | ICD-10-CM | POA: Diagnosis not present

## 2016-01-10 MED ORDER — INSULIN LISPRO 100 UNIT/ML (KWIKPEN): 15.0000 [IU] | PEN_INJECTOR | Freq: Three times a day (TID) | SUBCUTANEOUS | 11 refills | Status: DC

## 2016-01-10 MED ORDER — GLUCOSE BLOOD VI STRP: 1.0000 | ORAL_STRIP | Freq: Two times a day (BID) | 12 refills | Status: DC

## 2016-01-10 NOTE — Patient Instructions (Addendum)
good diet and exercise significantly improve the control of your diabetes.  please let me know if you wish to be referred to a dietician.  high blood sugar is very risky to your health.  you should see an eye doctor and dentist every year.  It is very important to get all recommended vaccinations.  Controlling your blood pressure and cholesterol drastically reduces the damage diabetes does to your body.  Those who smoke should quit.  Please discuss these with your doctor.  check your blood sugar twice a day.  vary the time of day when you check, between before the 3 meals, and at bedtime.  also check if you have symptoms of your blood sugar being too high or too low.  please keep a record of the readings and bring it to your next appointment here (or you can bring the meter itself).  You can write it on any piece of paper.  please call us sooner if your blood sugar goes below 70, or if you have a lot of readings over 200.  I have sent a prescription to your pharmacy, to add humalog. This will replace the glimepiride.  Please call next week, to tell us how the blood sugar is doing.   Please come back for a follow-up appointment in 1 month.      Hypoglycemia  Hypoglycemia is when the sugar (glucose) level in the blood is too low. Symptoms of low blood sugar may include:  Feeling:  Hungry.  Worried or nervous (anxious).  Sweaty and clammy.  Confused.  Dizzy.  Sleepy.  Sick to your stomach (nauseous).  Having:  A fast heartbeat.  A headache.  A change in your vision.  Jerky movements that you cannot control (seizure).  Nightmares.  Tingling or no feeling (numbness) around the mouth, lips, or tongue.  Having trouble with:  Talking.  Paying attention (concentrating).  Moving (coordination).  Sleeping.  Shaking.  Passing out (fainting).  Getting upset easily (irritability). Low blood sugar can happen to people who have diabetes and people who do not have diabetes.  Low blood sugar can happen quickly, and it can be an emergency. Treating Low Blood Sugar  Low blood sugar is often treated by eating or drinking something sugary right away. If you can think clearly and swallow safely, follow the 15:15 rule:  Take 15 grams of a fast-acting carb (carbohydrate). Some fast-acting carbs are:  1 tube of glucose gel.  3 sugar tablets (glucose pills).  6-8 pieces of hard candy.  4 oz (120 mL) of fruit juice.  4 oz (120 mL) of regular (not diet) soda.  Check your blood sugar 15 minutes after you take the carb.  If your blood sugar is still at or below 70 mg/dL (3.9 mmol/L), take 15 grams of a carb again.  If your blood sugar does not go above 70 mg/dL (3.9 mmol/L) after 3 tries, get help right away.  After your blood sugar goes back to normal, eat a meal or a snack within 1 hour. Treating Very Low Blood Sugar  If your blood sugar is at or below 54 mg/dL (3 mmol/L), you have very low blood sugar (severe hypoglycemia). This is an emergency. Do not wait to see if the symptoms will go away. Get medical help right away. Call your local emergency services (911 in the [place] Do not drive yourself to the hospital. If you have very low blood sugar and you cannot eat or drink, you may need a  glucagon shot (injection). A family member or friend should learn how to check your blood sugar and how to give you a glucagon shot. Ask your doctor if you need to have a glucagon shot kit at home. Follow these instructions at home: General instructions  Avoid any diets that cause you to not eat enough food. Talk with your doctor before you start any new diet.  Take over-the-counter and prescription medicines only as told by your doctor.  Limit alcohol to no more than 1 drink per day for nonpregnant women and 2 drinks per day for men. One drink equals 12 oz of beer, 5 oz of wine, or 1 oz of hard liquor.  Keep all follow-up visits as told by your doctor. This is important. If  You Have Diabetes:   Make sure you know the symptoms of low blood sugar.  Always keep a source of sugar with you, such as:  Sugar.  Sugar tablets.  Glucose gel.  Fruit juice.  Regular soda (not diet soda).  Milk.  Hard candy.  Honey.  Take your medicines as told.  Follow your exercise and meal plan.  Eat on time. Do not skip meals.  Follow your sick day plan when you cannot eat or drink normally. Make this plan ahead of time with your doctor.  Check your blood sugar as often as told by your doctor. Always check before and after exercise.  Share your diabetes care plan with:  Your work or school.  People you live with.  Check your pee (urine) for ketones:  When you are sick.  As told by your doctor.  Carry a card or wear jewelry that says you have diabetes. If You Have Low Blood Sugar From Other Causes:   Check your blood sugar as often as told by your doctor.  Follow instructions from your doctor about what you cannot eat or drink. Contact a doctor if:  You have trouble keeping your blood sugar in your target range.  You have low blood sugar often. Get help right away if:  You still have symptoms after you eat or drink something sugary.  Your blood sugar is at or below 54 mg/dL (3 mmol/L).  You have jerky movements that you cannot control.  You pass out. These symptoms may be an emergency. Do not wait to see if the symptoms will go away. Get medical help right away. Call your local emergency services (911 in the [place] Do not drive yourself to the hospital.  This information is not intended to replace advice given to you by your health care provider. Make sure you discuss any questions you have with your health care provider. Document Released: 04/11/2009 Document Revised: 06/23/2015 Document Reviewed: 02/18/2015 Elsevier Interactive Patient Education  2017 [employer].

## 2016-01-17 ENCOUNTER — Ambulatory Visit
Admission: RE | Admit: 2016-01-17 | Discharge: 2016-01-17 | Disposition: A | Discharge status: Home / Self Care | Payer: 59 | Source: Ambulatory Visit | Attending: Orthopedic Surgery | Admitting: Orthopedic Surgery

## 2016-01-17 DIAGNOSIS — M25562 Pain in left knee: Secondary | ICD-10-CM

## 2016-01-18 ENCOUNTER — Other Ambulatory Visit: Payer: Self-pay | Admitting: Family Medicine

## 2016-01-25 ENCOUNTER — Encounter: Payer: 59 | Admitting: Nutrition

## 2016-01-26 ENCOUNTER — Encounter: Payer: Self-pay | Admitting: Family Medicine

## 2016-01-26 ENCOUNTER — Ambulatory Visit (INDEPENDENT_AMBULATORY_CARE_PROVIDER_SITE_OTHER): Payer: 59 | Admitting: Family Medicine

## 2016-01-26 DIAGNOSIS — E1169 Type 2 diabetes mellitus with other specified complication: Secondary | ICD-10-CM

## 2016-01-26 DIAGNOSIS — E669 Obesity, unspecified: Secondary | ICD-10-CM | POA: Diagnosis not present

## 2016-01-26 DIAGNOSIS — J019 Acute sinusitis, unspecified: Secondary | ICD-10-CM

## 2016-01-26 DIAGNOSIS — I10 Essential (primary) hypertension: Secondary | ICD-10-CM | POA: Diagnosis not present

## 2016-01-26 DIAGNOSIS — J0100 Acute maxillary sinusitis, unspecified: Secondary | ICD-10-CM | POA: Diagnosis not present

## 2016-01-26 HISTORY — DX: Acute sinusitis, unspecified: J01.90

## 2016-01-26 MED ORDER — HYDROCODONE-HOMATROPINE 5-1.5 MG/5ML PO SYRP: 5.0000 mL | ORAL_SOLUTION | Freq: Three times a day (TID) | ORAL | 0 refills | Status: DC | PRN

## 2016-01-26 MED ORDER — CEFDINIR 300 MG PO CAPS: 300.0000 mg | ORAL_CAPSULE | Freq: Two times a day (BID) | ORAL | 0 refills | Status: AC

## 2016-01-26 NOTE — Progress Notes (Signed)
Pre visit review using our clinic review tool, if applicable. No additional management support is needed unless otherwise documented below in the visit note. 

## 2016-01-26 NOTE — Assessment & Plan Note (Signed)
Well controlled, no changes to meds. Encouraged heart healthy diet such as the DASH diet and exercise as tolerated.  °

## 2016-01-26 NOTE — Patient Instructions (Signed)
Elderberry, probiotic, vitamin c and Zinc such as Coldeeze and nasal saline Sinusitis, Adult Sinusitis is soreness and inflammation of your sinuses. Sinuses are hollow spaces in the bones around your face. They are located:  Around your eyes.  In the middle of your forehead.  Behind your nose.  In your cheekbones. Your sinuses and nasal passages are lined with a stringy fluid (mucus). Mucus normally drains out of your sinuses. When your nasal tissues get inflamed or swollen, the mucus can get trapped or blocked so air cannot flow through your sinuses. This lets bacteria, viruses, and funguses grow, and that leads to infection. Follow these instructions at home: Medicines  Take, use, or apply over-the-counter and prescription medicines only as told by your doctor. These may include nasal sprays.  If you were prescribed an antibiotic medicine, take it as told by your doctor. Do not stop taking the antibiotic even if you start to feel better. Hydrate and Humidify  Drink enough water to keep your pee (urine) clear or pale yellow.  Use a cool mist humidifier to keep the humidity level in your home above 50%.  Breathe in steam for 10-15 minutes, 3-4 times a day or as told by your doctor. You can do this in the bathroom while a hot shower is running.  Try not to spend time in cool or dry air. Rest  Rest as much as possible.  Sleep with your head raised (elevated).  Make sure to get enough sleep each night. General instructions  Put a warm, moist washcloth on your face 3-4 times a day or as told by your doctor. This will help with discomfort.  Wash your hands often with soap and water. If there is no soap and water, use hand sanitizer.  Do not smoke. Avoid being around people who are smoking (secondhand smoke).  Keep all follow-up visits as told by your doctor. This is important. Contact a doctor if:  You have a fever.  Your symptoms get worse.  Your symptoms do not get  better within 10 days. Get help right away if:  You have a very bad headache.  You cannot stop throwing up (vomiting).  You have pain or swelling around your face or eyes.  You have trouble seeing.  You feel confused.  Your neck is stiff.  You have trouble breathing. This information is not intended to replace advice given to you by your health care provider. Make sure you discuss any questions you have with your health care provider. Document Released: 07/04/2007 Document Revised: 09/11/2015 Document Reviewed: 11/10/2014 Elsevier Interactive Patient Education  2017 [employer]

## 2016-01-27 NOTE — Progress Notes (Signed)
Patient ID: Stephanie Cordova, female   DOB: Jun 03, 1955, 60 y.o.   MRN: [id number]   Subjective:    Patient ID: Stephanie Cordova, female    DOB: May 01, 1955, 60 y.o.   MRN: [id number]  Chief Complaint  Patient presents with  . Cough    HPI Patient is in today for evaluation of respiratory symptoms that are worsening and have been present for roughly 2 weeks. She has head congestion and facial pressure. She endorses green rhinorrhea and malaise. Low grade chills and possible fevers noted. Fatigue, myalgias also noted. Denies CP/palp/SOB/HA/GI or GU c/o. Taking meds as prescribed. OTC meds have not been helpful  Past Medical History:  Diagnosis Date  . Arthritis    b/l knees, bone on bone  . Diabetes mellitus type 2 in obese ([facility]) 12/21/2012   takes Amaryl daily  . History of bronchitis 03/2015  . History of colon polyps    benign  . Hypertension    takes Metoprolol and Lisinopril daily  . Insomnia   . Pneumonia    hx of > 5 yrs ago  . Sinusitis, acute 01/26/2016    Past Surgical History:  Procedure Laterality Date  . ABDOMINAL HYSTERECTOMY  1996  . BREAST BIOPSY    . CHOLECYSTECTOMY N/A 09/06/2015   Procedure: LAPAROSCOPIC CHOLECYSTECTOMY;  Surgeon: Ralene Ok, MD;  Location: [facility];  Service: General;  Laterality: N/A;  . COLONOSCOPY    . COLONOSCOPY WITH ESOPHAGOGASTRODUODENOSCOPY (EGD)    . KNEE ARTHROSCOPY Bilateral   . TONSILLECTOMY  1968  . TUBAL LIGATION      Family History  Problem Relation Age of Onset  . Hypertension Mother   . Hyperlipidemia Mother   . Leukemia Mother   . Cancer Mother   . Hypertension Father   . Hyperlipidemia Father   . Diabetes Father   . Heart disease Father   . Alzheimer's disease Father   . Multiple sclerosis Son   . Heart disease Maternal Grandfather     Social History   Social History  . Marital status: Widowed    Spouse name: N/A  . Number of children: N/A  . Years of education: N/A   Occupational History  . Customer service      Social History Main Topics  . Smoking status: Never Smoker  . Smokeless tobacco: Never Used  . Alcohol use No  . Drug use: No  . Sexual activity: No     Comment: lives alone, widowed in 2006   Other Topics Concern  . Not on file   Social History Narrative  . No narrative on file    Outpatient Medications Prior to Visit  Medication Sig Dispense Refill  . Blood Glucose Monitoring Suppl (ONE TOUCH ULTRA SYSTEM KIT) w/Device KIT Use as directed twice daily to check blood sugar.  DX E11.9 1 each 0  . Dulaglutide (TRULICITY) 5.97 CB/6.3AG SOPN Inject 0.75 mg into the skin once a week. 2 mL 5  . fluconazole (DIFLUCAN) 150 MG tablet Take 1 tablet by mouth once a week for 2 weeks. 2 tablet 0  . furosemide (LASIX) 20 MG tablet TAKE 1 TABLET BY MOUTH EVERY DAY AS NEEDED FOR EDEMA 30 tablet 0  . glucose blood (ONETOUCH VERIO) test strip 1 each by Other route 2 (two) times daily. And lancets 2/day 100 each 12  . insulin lispro (HUMALOG KWIKPEN) 100 UNIT/ML KiwkPen Inject 0.15 mLs (15 Units total) into the skin 3 (three) times daily with meals. And pen needles  3/day 45 mL 11  . lisinopril (PRINIVIL,ZESTRIL) 10 MG tablet Take 1 tablet (10 mg total) by mouth daily. 30 tablet 3  . metoprolol tartrate (LOPRESSOR) 25 MG tablet Take 25 mg by mouth daily.    . naproxen sodium (ALEVE) 220 MG tablet Take 220 mg by mouth 2 (two) times daily as needed (for knee pain).    . ONE TOUCH ULTRA TEST test strip USE AS DIRECTED TWICE A DAY 100 each 2  . ONETOUCH DELICA LANCETS 40X MISC CHECK BLOOD SUGAR TWICE DAILY. DX CODE E11.9 100 each 1  . traMADol (ULTRAM) 50 MG tablet Take 1 tablet (50 mg total) by mouth every 8 (eight) hours as needed. 30 tablet 0   No facility-administered medications prior to visit.     Allergies  Allergen Reactions  . Losartan Palpitations  . Lexapro [Escitalopram] Other (See Comments)    swelling  . Sulfa Antibiotics Hives and Rash    "Burning" rash    Review of Systems   Constitutional: Positive for malaise/fatigue. Negative for fever.  HENT: Positive for congestion.   Eyes: Negative for blurred vision.  Respiratory: Positive for cough, sputum production and shortness of breath. Negative for hemoptysis and wheezing.   Cardiovascular: Negative for chest pain, palpitations and leg swelling.  Gastrointestinal: Negative for abdominal pain, blood in stool and nausea.  Genitourinary: Negative for dysuria and frequency.  Musculoskeletal: Negative for falls.  Skin: Negative for rash.  Neurological: Negative for dizziness, loss of consciousness and headaches.  Endo/Heme/Allergies: Negative for environmental allergies.  Psychiatric/Behavioral: Negative for depression. The patient is not nervous/anxious.        Objective:    Physical Exam  Constitutional: She is oriented to person, place, and time. She appears well-developed and well-nourished. No distress.  HENT:  Head: Normocephalic and atraumatic.  Nose: Nose normal.  Oropharynx erythematous  Eyes: Right eye exhibits no discharge. Left eye exhibits no discharge.  Neck: Normal range of motion. Neck supple.  Cardiovascular: Normal rate and regular rhythm.   No murmur heard. Pulmonary/Chest: Effort normal and breath sounds normal.  Abdominal: Soft. Bowel sounds are normal. There is no tenderness.  Musculoskeletal: She exhibits no edema.  Lymphadenopathy:    She has cervical adenopathy.  Neurological: She is alert and oriented to person, place, and time.  Skin: Skin is warm and dry.  Psychiatric: She has a normal mood and affect.  Nursing note and vitals reviewed.   BP 122/72 (BP Location: Left Arm, Patient Position: Sitting, Cuff Size: Large)   Pulse 97   Temp 98.5 F (36.9 C) (Oral)   Ht 5' 1"  (1.549 m)   Wt 257 lb (116.6 kg)   LMP 01/29/1994   SpO2 97%   BMI 48.56 kg/m  Wt Readings from Last 3 Encounters:  01/26/16 257 lb (116.6 kg)  01/10/16 260 lb (117.9 kg)  12/06/15 260 lb 2 oz (118  kg)     Lab Results  Component Value Date   WBC 5.8 12/09/2015   HGB 14.1 12/09/2015   HCT 41.0 12/09/2015   PLT 132.0 (L) 12/09/2015   GLUCOSE 245 (H) 12/09/2015   CHOL 156 12/09/2015   TRIG 93.0 12/09/2015   HDL 63.00 12/09/2015   LDLCALC 75 12/09/2015   ALT 49 (H) 12/09/2015   AST 46 (H) 12/09/2015   NA 138 12/09/2015   K 4.1 12/09/2015   CL 105 12/09/2015   CREATININE 0.71 12/09/2015   BUN 12 12/09/2015   CO2 25 12/09/2015   TSH 2.06  12/09/2015   HGBA1C 10.0 (H) 12/09/2015   MICROALBUR 2.6 (H) 12/09/2015    Lab Results  Component Value Date   TSH 2.06 12/09/2015   Lab Results  Component Value Date   WBC 5.8 12/09/2015   HGB 14.1 12/09/2015   HCT 41.0 12/09/2015   MCV 86.1 12/09/2015   PLT 132.0 (L) 12/09/2015   Lab Results  Component Value Date   NA 138 12/09/2015   K 4.1 12/09/2015   CO2 25 12/09/2015   GLUCOSE 245 (H) 12/09/2015   BUN 12 12/09/2015   CREATININE 0.71 12/09/2015   BILITOT 0.9 12/09/2015   ALKPHOS 76 12/09/2015   AST 46 (H) 12/09/2015   ALT 49 (H) 12/09/2015   PROT 6.2 12/09/2015   ALBUMIN 3.8 12/09/2015   CALCIUM 9.1 12/09/2015   ANIONGAP 9 09/05/2015   GFR 89.34 12/09/2015   Lab Results  Component Value Date   CHOL 156 12/09/2015   Lab Results  Component Value Date   HDL 63.00 12/09/2015   Lab Results  Component Value Date   LDLCALC 75 12/09/2015   Lab Results  Component Value Date   TRIG 93.0 12/09/2015   Lab Results  Component Value Date   CHOLHDL 2 12/09/2015   Lab Results  Component Value Date   HGBA1C 10.0 (H) 12/09/2015       Assessment & Plan:   Problem List Items Addressed This Visit    HTN (hypertension)    Well controlled, no changes to meds. Encouraged heart healthy diet such as the DASH diet and exercise as tolerated.       Diabetes mellitus type 2 in obese ([facility])     minimize simple carbs. Increase exercise as tolerated. Continue current meds but is not taking Humalog so advised to start  Humalog 4 units with biggest meals. Sugars are now running around 150 down from 250 recheck hgba1c in 6 weeks      Sinusitis, acute    Encouraged increased rest and hydration, add probiotics, zinc such as Coldeze or Xicam. Treat fevers as needed. mucinex bid, elderberry and vitamin C. Started on Cefdinir      Relevant Medications   cefdinir (OMNICEF) 300 MG capsule   HYDROcodone-homatropine (HYCODAN) 5-1.5 MG/5ML syrup      I am having Ms. Afshar start on cefdinir and HYDROcodone-homatropine. I am also having her maintain her [place] KIT, ONE TOUCH ULTRA TEST, metoprolol tartrate, naproxen sodium, lisinopril, ONETOUCH DELICA LANCETS 50N, fluconazole, Dulaglutide, traMADol, glucose blood, insulin lispro, and furosemide.  Meds ordered this encounter  Medications  . cefdinir (OMNICEF) 300 MG capsule    Sig: Take 1 capsule (300 mg total) by mouth 2 (two) times daily.    Dispense:  28 capsule    Refill:  0  . HYDROcodone-homatropine (HYCODAN) 5-1.5 MG/5ML syrup    Sig: Take 5 mLs by mouth every 8 (eight) hours as needed for cough.    Dispense:  150 mL    Refill:  0      Penni Homans, MD

## 2016-01-27 NOTE — Assessment & Plan Note (Signed)
Encouraged increased rest and hydration, add probiotics, zinc such as Coldeze or Xicam. Treat fevers as needed. mucinex bid, elderberry and vitamin C. Started on Cefdinir

## 2016-01-27 NOTE — Assessment & Plan Note (Signed)
minimize simple carbs. Increase exercise as tolerated. Continue current meds but is not taking Humalog so advised to start Humalog 4 units with biggest meals. Sugars are now running around 150 down from 250 recheck hgba1c in 6 weeks

## 2016-02-07 ENCOUNTER — Other Ambulatory Visit: Payer: Self-pay | Admitting: Family Medicine

## 2016-02-13 NOTE — Progress Notes (Deleted)
   Subjective:    Patient ID: Stephanie Cordova, female    DOB: 01/16/1956, 61 y.o.   MRN: [id number]  HPI Pt returns for f/u of diabetes mellitus: DM type: Insulin-requiring type 2 Dx'ed: 2015 Complications: none Therapy: insulin since 2017 GDM: never DKA: never Severe hypoglycemia: never Pancreatitis: never Other: she takes multiple daily injections Interval history:    Review of Systems     Objective:   Physical Exam VITAL SIGNS:  See vs page GENERAL: no distress Pulses: dorsalis pedis intact bilat.   MSK: no deformity of the feet CV: trace bilat leg edema Skin:  no ulcer on the feet.  normal color and temp on the feet. Neuro: sensation is intact to touch on the feet Ext: There is bilateral onychomycosis of the toenails.          Assessment & Plan:  Insulin-requiring type 2 DM:

## 2016-02-14 ENCOUNTER — Ambulatory Visit: Payer: 59 | Admitting: Endocrinology

## 2016-03-05 ENCOUNTER — Emergency Department (HOSPITAL_BASED_OUTPATIENT_CLINIC_OR_DEPARTMENT_OTHER): Payer: No Typology Code available for payment source

## 2016-03-05 ENCOUNTER — Emergency Department (HOSPITAL_BASED_OUTPATIENT_CLINIC_OR_DEPARTMENT_OTHER)
Admission: EM | Admit: 2016-03-05 | Discharge: 2016-03-05 | Disposition: A | Discharge status: Home / Self Care | Payer: No Typology Code available for payment source | Attending: Emergency Medicine | Admitting: Emergency Medicine

## 2016-03-05 ENCOUNTER — Encounter (HOSPITAL_BASED_OUTPATIENT_CLINIC_OR_DEPARTMENT_OTHER): Payer: Self-pay | Admitting: Emergency Medicine

## 2016-03-05 DIAGNOSIS — I10 Essential (primary) hypertension: Secondary | ICD-10-CM | POA: Diagnosis not present

## 2016-03-05 DIAGNOSIS — R188 Other ascites: Secondary | ICD-10-CM | POA: Insufficient documentation

## 2016-03-05 DIAGNOSIS — R1011 Right upper quadrant pain: Secondary | ICD-10-CM | POA: Diagnosis present

## 2016-03-05 DIAGNOSIS — R109 Unspecified abdominal pain: Secondary | ICD-10-CM

## 2016-03-05 DIAGNOSIS — K7460 Unspecified cirrhosis of liver: Secondary | ICD-10-CM

## 2016-03-05 LAB — CBC WITH DIFFERENTIAL/PLATELET
BASOS PCT: 0 %
Basophils Absolute: 0 10*3/uL (ref 0.0–0.1)
EOS ABS: 0 10*3/uL (ref 0.0–0.7)
Eosinophils Relative: 0 %
HCT: 38.3 % (ref 36.0–46.0)
HEMOGLOBIN: 13.1 g/dL (ref 12.0–15.0)
Lymphocytes Relative: 7 %
Lymphs Abs: 0.6 10*3/uL — ABNORMAL LOW (ref 0.7–4.0)
MCH: 30.3 pg (ref 26.0–34.0)
MCHC: 34.2 g/dL (ref 30.0–36.0)
MCV: 88.5 fL (ref 78.0–100.0)
MONOS PCT: 9 %
Monocytes Absolute: 0.8 10*3/uL (ref 0.1–1.0)
NEUTROS PCT: 84 %
Neutro Abs: 7.8 10*3/uL — ABNORMAL HIGH (ref 1.7–7.7)
Platelets: 130 10*3/uL — ABNORMAL LOW (ref 150–400)
RBC: 4.33 MIL/uL (ref 3.87–5.11)
RDW: 13.1 % (ref 11.5–15.5)
WBC: 9.3 10*3/uL (ref 4.0–10.5)

## 2016-03-05 LAB — COMPREHENSIVE METABOLIC PANEL
ALK PHOS: 87 U/L (ref 38–126)
ALT: 31 U/L (ref 14–54)
AST: 30 U/L (ref 15–41)
Albumin: 3.5 g/dL (ref 3.5–5.0)
Anion gap: 9 (ref 5–15)
BILIRUBIN TOTAL: 1.4 mg/dL — AB (ref 0.3–1.2)
BUN: 9 mg/dL (ref 6–20)
CALCIUM: 8.6 mg/dL — AB (ref 8.9–10.3)
CO2: 25 mmol/L (ref 22–32)
CREATININE: 0.56 mg/dL (ref 0.44–1.00)
Chloride: 101 mmol/L (ref 101–111)
Glucose, Bld: 296 mg/dL — ABNORMAL HIGH (ref 65–99)
Potassium: 4 mmol/L (ref 3.5–5.1)
Sodium: 135 mmol/L (ref 135–145)
TOTAL PROTEIN: 6.3 g/dL — AB (ref 6.5–8.1)

## 2016-03-05 LAB — URINALYSIS, ROUTINE W REFLEX MICROSCOPIC
BILIRUBIN URINE: NEGATIVE
Glucose, UA: >=500 mg/dL — AB
Hgb urine dipstick: NEGATIVE
KETONES UR: NEGATIVE mg/dL
Leukocytes, UA: NEGATIVE
NITRITE: NEGATIVE
Protein, ur: NEGATIVE mg/dL
SPECIFIC GRAVITY, URINE: 1.042 — AB (ref 1.005–1.030)
pH: 6.5 (ref 5.0–8.0)

## 2016-03-05 LAB — URINALYSIS, MICROSCOPIC (REFLEX): RBC / HPF: NONE SEEN RBC/hpf (ref 0–5)

## 2016-03-05 LAB — CBG MONITORING, ED: Glucose-Capillary: 242 mg/dL — ABNORMAL HIGH (ref 65–99)

## 2016-03-05 MED ORDER — SODIUM CHLORIDE 0.9 % IV BOLUS (SEPSIS)
1000.0000 mL | Freq: Once | INTRAVENOUS | Status: AC
  Administered 2016-03-05: 1000 mL via INTRAVENOUS

## 2016-03-05 MED ORDER — ONDANSETRON HCL 4 MG PO TABS: 4.0000 mg | ORAL_TABLET | Freq: Three times a day (TID) | ORAL | 0 refills | Status: DC | PRN

## 2016-03-05 MED ORDER — ONDANSETRON 4 MG PO TBDP
4.0000 mg | ORAL_TABLET | Freq: Once | ORAL | Status: AC
  Administered 2016-03-05: 4 mg via ORAL
  Filled 2016-03-05: qty 1

## 2016-03-05 MED ORDER — IOPAMIDOL (ISOVUE-300) INJECTION 61%
100.0000 mL | Freq: Once | INTRAVENOUS | Status: AC | PRN
  Administered 2016-03-05: 100 mL via INTRAVENOUS

## 2016-03-05 MED ORDER — ONDANSETRON HCL 4 MG/2ML IJ SOLN: 4.0000 mg | Freq: Once | INTRAMUSCULAR | Status: DC

## 2016-03-05 NOTE — Discharge Instructions (Signed)
FOLLOW UP WITH YOUR PRIMARY CARE PROVIDER FOR FURTHER WORK UP OF YOUR LIVER INCLUDING MRI OF LIVER WITH AND WITHOUT CONTRAST.

## 2016-03-05 NOTE — ED Provider Notes (Signed)
[facility] DEPT Provider Note   CSN: [id number] Arrival date & time: 03/05/16 2671     History    Chief Complaint  Patient presents with  . Abdominal Pain     HPI ALAZNE QUANT is a 61 y.o. female.  61yo F w/ PMH below including T2DM, HTN, Cholecystectomy who presents with right-sided abdominal pain and back pain. The patient states that last night she had a gradual onset of right back/flank pain. Overnight she took a tramadol and Advil that eased off her pain but it returned this morning. Today, her pain has been on the right side of her abdomen. Pain is intermittent and moderate in intensity. The pain is worse with certain movements and worse when she takes a deep breath. She denies any chest pain or shortness of breath. She endorses associated nausea and chills with no vomiting, diarrhea, or fevers. She had increased urinary frequency yesterday but denies any dysuria or hematuria. No history of kidney stones.   Past Medical History:  Diagnosis Date  . Arthritis    b/l knees, bone on bone  . Diabetes mellitus type 2 in obese ([place]) 12/21/2012   takes Amaryl daily  . History of bronchitis 03/2015  . History of colon polyps    benign  . Hypertension    takes Metoprolol and Lisinopril daily  . Insomnia   . Pneumonia    hx of > 5 yrs ago  . Sinusitis, acute 01/26/2016     Patient Active Problem List   Diagnosis Date Noted  . Sinusitis, acute 01/26/2016  . Trigger finger, acquired 11/16/2015  . Left knee pain 11/16/2015  . RUQ pain 08/09/2015  . Skin lesion of right lower extremity 08/09/2015  . Snoring 06/29/2014  . Cephalalgia 06/19/2014  . Restless sleeper 06/19/2014  . Diabetes mellitus type 2 in obese ([place]) 12/21/2012  . Fatty infiltration of liver 12/21/2012  . Preventative health care 11/30/2012  . Personal history of colonic polyps 11/30/2012  . HTN (hypertension) 11/30/2012  . Insomnia   . Obesity   . Hot flash, menopausal   . Arthritis   . Allergy       Past Surgical History:  Procedure Laterality Date  . ABDOMINAL HYSTERECTOMY  1996  . BREAST BIOPSY    . CHOLECYSTECTOMY N/A 09/06/2015   Procedure: LAPAROSCOPIC CHOLECYSTECTOMY;  Surgeon: Ralene Ok, MD;  Location: [facility];  Service: General;  Laterality: N/A;  . COLONOSCOPY WITH ESOPHAGOGASTRODUODENOSCOPY (EGD)    . KNEE ARTHROSCOPY Bilateral   . TONSILLECTOMY    . TUBAL LIGATION      OB History    No data available        Home Medications    Prior to Admission medications   Medication Sig Start Date End Date Taking? Authorizing Provider  Blood Glucose Monitoring Suppl (ONE TOUCH ULTRA SYSTEM KIT) w/Device KIT Use as directed twice daily to check blood sugar.  DX E11.9 07/07/15   Mosie Lukes, MD  Dulaglutide (TRULICITY) 2.45 YK/9.9IP SOPN Inject 0.75 mg into the skin once a week. 12/06/15   Mosie Lukes, MD  furosemide (LASIX) 20 MG tablet TAKE 1 TABLET BY MOUTH EVERY DAY AS NEEDED FOR EDEMA 02/07/16   Mosie Lukes, MD  glucose blood (ONETOUCH VERIO) test strip 1 each by Other route 2 (two) times daily. And lancets 2/day 01/10/16   Renato Shin, MD  lisinopril (PRINIVIL,ZESTRIL) 10 MG tablet Take 1 tablet (10 mg total) by mouth daily. 10/26/15   Debbrah Alar, NP  ondansetron (ZOFRAN) 4 MG tablet Take 1 tablet (4 mg total) by mouth every 8 (eight) hours as needed for nausea or vomiting. 03/05/16   Sharlett Iles, MD  ONE TOUCH ULTRA TEST test strip USE AS DIRECTED TWICE A DAY 07/11/15   Mosie Lukes, MD  ONETOUCH DELICA LANCETS 56P MISC CHECK BLOOD SUGAR TWICE DAILY. DX CODE E11.9 10/31/15   Mosie Lukes, MD  traMADol (ULTRAM) 50 MG tablet Take 1 tablet (50 mg total) by mouth every 8 (eight) hours as needed. 12/06/15   Mosie Lukes, MD      Family History  Problem Relation Age of Onset  . Hypertension Mother   . Hyperlipidemia Mother   . Leukemia Mother   . Cancer Mother   . Hypertension Father   . Hyperlipidemia Father   . Diabetes Father   . Heart  disease Father   . Alzheimer's disease Father   . Multiple sclerosis Son   . Heart disease Maternal Grandfather      Social History  Substance Use Topics  . Smoking status: Never Smoker  . Smokeless tobacco: Never Used  . Alcohol use No     Allergies     Losartan; Lexapro [escitalopram]; and Sulfa antibiotics    Review of Systems  10 Systems reviewed and are negative for acute change except as noted in the HPI.   Physical Exam Updated Vital Signs BP 155/79 (BP Location: Right Arm)   Pulse 79   Temp 97.9 F (36.6 C) (Oral)   Resp 17   Ht 5' 3"  (1.6 m)   Wt 260 lb (117.9 kg)   LMP 01/29/1994   SpO2 98%   BMI 46.06 kg/m   Physical Exam  Constitutional: She is oriented to person, place, and time. She appears well-developed and well-nourished. No distress.  HENT:  Head: Normocephalic and atraumatic.  Moist mucous membranes  Eyes: Conjunctivae are normal. Pupils are equal, round, and reactive to light.  Neck: Neck supple.  Cardiovascular: Normal rate, regular rhythm and normal heart sounds.   No murmur heard. Pulmonary/Chest: Effort normal and breath sounds normal.  Abdominal: Soft. Bowel sounds are normal. She exhibits no distension. There is tenderness. There is no rebound and no guarding.  Mild RUQ and RLQ ttp  Musculoskeletal: She exhibits no edema.  Neurological: She is alert and oriented to person, place, and time.  Fluent speech  Skin: Skin is warm and dry.  Psychiatric: She has a normal mood and affect. Judgment normal.  Nursing note and vitals reviewed.     ED Treatments / Results  Labs (all labs ordered are listed, but only abnormal results are displayed) Labs Reviewed  COMPREHENSIVE METABOLIC PANEL - Abnormal; Notable for the following:       Result Value   Glucose, Bld 296 (*)    Calcium 8.6 (*)    Total Protein 6.3 (*)    Total Bilirubin 1.4 (*)    All other components within normal limits  CBC WITH DIFFERENTIAL/PLATELET - Abnormal;  Notable for the following:    Platelets 130 (*)    Neutro Abs 7.8 (*)    Lymphs Abs 0.6 (*)    All other components within normal limits  URINALYSIS, ROUTINE W REFLEX MICROSCOPIC - Abnormal; Notable for the following:    Specific Gravity, Urine 1.042 (*)    Glucose, UA >=500 (*)    All other components within normal limits  URINALYSIS, MICROSCOPIC (REFLEX) - Abnormal; Notable for the following:  Bacteria, UA MANY (*)    Squamous Epithelial / LPF 0-5 (*)    All other components within normal limits  CBG MONITORING, ED - Abnormal; Notable for the following:    Glucose-Capillary 242 (*)    All other components within normal limits     EKG  EKG Interpretation  Date/Time:    Ventricular Rate:    PR Interval:    QRS Duration:   QT Interval:    QTC Calculation:   R Axis:     Text Interpretation:           Radiology Ct Abdomen Pelvis W Contrast  Result Date: 03/05/2016 CLINICAL DATA:  Pt c/o RLQ and RUQ abdominal pain this morning, Last night it was more in her back. Some nausea. Pt states she has some chills, hx of HTN, diabetes, hx of hysterectomy, cholecystectomy, no other complaints EXAM: CT ABDOMEN AND PELVIS WITH CONTRAST TECHNIQUE: Multidetector CT imaging of the abdomen and pelvis was performed using the standard protocol following bolus administration of intravenous contrast. CONTRAST:  124m ISOVUE-300 IOPAMIDOL (ISOVUE-300) INJECTION 61% COMPARISON:  Current right upper quadrant ultrasound. FINDINGS: Lower chest: No acute findings. 5 mm nodule in the right middle lobe. Minimal right pleural effusion. Heart is mildly enlarged. Hepatobiliary: Liver shows morphologic changes that suggests cirrhosis, as noted on the current ultrasound. There is central volume loss with relative enlargement of the lateral segment of the left lobe. Attenuation is heterogeneous with subtle ill-defined areas of hypoattenuation throughout the liver, but no defined measurable mass. Gallbladder is  surgically absent. No bile duct dilation. Pancreas: Unremarkable. No pancreatic ductal dilatation or surrounding inflammatory changes. Spleen: Mildly enlarged measuring 13.1 x 6.0 x 12.3 cm. No splenic mass or focal lesion. Adrenals/Urinary Tract: 12 mm nonspecific left adrenal mass with average Hounsfield units of 50. Normal right adrenal gland. Bilateral renal sinus cysts. 6.2 cm lower pole left renal cortical cyst. 1 cm midpole left renal cortical cyst. Subcentimeter cortical low-density mass from the midpole the right kidney, also likely a cyst. No other masses, no stones and no hydronephrosis. Normal ureters. Normal bladder. Stomach/Bowel: Normal stomach and small bowel. There are few colonic diverticula. No diverticulitis. Colon otherwise unremarkable. Normal appendix visualized. Vascular/Lymphatic: No significant vascular findings are present. No enlarged abdominal or pelvic lymph nodes. Reproductive: Status post hysterectomy. No adnexal masses. Other: No abdominal wall hernia or abnormality. No abdominopelvic ascites. Musculoskeletal: Left gluteus medius lipoma measuring 10 x 3.3 x 5.2 cm. No fracture or acute finding. No osteoblastic or osteolytic lesions. IMPRESSION: 1. No acute abnormalities within the abdomen pelvis. 2. As noted on ultrasound, findings support cirrhosis. Mild portal venous hypertension is suggested by mild splenomegaly. 3. Liver also shows a somewhat mottled appearance with ill-defined areas of hypoattenuation throughout the liver without a discrete mass. Consider further imaging assessment with liver MRI with and without contrast. 4. 5 mm nodule in the right middle lobe. No follow-up needed if patient is low-risk. Non-contrast chest CT can be considered in 12 months if patient is high-risk. This recommendation follows the consensus statement: Guidelines for Management of Incidental Pulmonary Nodules Detected on CT Images: From the Fleischner Society 2017; Radiology 2017; 284:228-243. 5.  Renal cysts. Electronically Signed   By: DLajean ManesM.D.   On: 03/05/2016 15:28   [place]orea[place]domen Limited Ruq  Result Date: 03/05/2016 CLINICAL DATA:  Acute onset right upper quadrant pain and nausea 6 hours ago. Previous cholecystectomy. EXAM: [place]oreaABDOMEN LIMITED - RIGHT UPPER QUADRANT COMPARISON:  08/12/2015 FINDINGS: Gallbladder:  Surgically absent. Common bile duct: Diameter: 6 mm, within normal limits. Liver: Diffusely increased and coarse parenchymal echotexture is seen as well as mild capsular nodularity. This is consistent with diffuse hepatocellular disease, and suspicious for hepatic cirrhosis. No liver mass visualized. IMPRESSION: Prior cholecystectomy.  No evidence of biliary ductal dilatation. Diffuse hepatocellular disease, and findings suspicious for hepatic cirrhosis. Consider nonemergent hepatic elastography ultrasound for quantitative assessment of hepatic fibrosis. Electronically Signed   By: Earle Gell M.D.   On: 03/05/2016 13:20    Procedures Procedures (including critical care time) Procedures  Medications Ordered in ED  Medications  ondansetron (ZOFRAN-ODT) disintegrating tablet 4 mg (4 mg Oral Given 03/05/16 0942)  sodium chloride 0.9 % bolus 1,000 mL (0 mLs Intravenous Stopped 03/05/16 1400)  iopamidol (ISOVUE-300) 61 % injection 100 mL (100 mLs Intravenous Contrast Given 03/05/16 1503)     Initial Impression / Assessment and Plan / ED Course  I have reviewed the triage vital signs and the nursing notes.  Pertinent labs & imaging results that were available during my care of the patient were reviewed by me and considered in my medical decision making (see chart for details).     PT w/ h/o low cystectomy presents with right back pain last night, now right-sided abdominal pain this morning. She was comfortable on exam, vital signs notable for hypertension at 190/108. She had mild right upper quadrant and right lower quadrant tenderness to palpation. No rebound or guarding.  Obtained above lab work and gave the patient Zofran.  Labwork shows hyperglycemia without DKA. Blood glucose improved after IV fluid bolus. CBC is reassuring. Total bilirubin slightly elevated at 1.4 with normal LFTs. Because of the patient's right upper quadrant pain and initial back pain, obtained a right upper quadrant ultrasound to evaluate biliary system. Her sound without any acute findings but does note abnormal liver suspicious for cirrhosis. On reexamination, the patient continued to have right upper quadrant and right lower quadrant pain. Because of RLQ tenderness, obtained CT to r/o appy or diverticulitis. ET without acute findings to explain the patient's symptoms but does note abnormal appearance of liver consistent with cirrhosis. Also recommended MRI given abnormal appearance. I have discussed these results with the patient and recommended she follow up with PCP to arrange this outpatient study. She had a pulmonary nodule but states that she is a nonsmoker and is low risk for lung cancer therefore does not need any further imaging. I have discussed return precautions with patient and she has voiced understanding. Patient discharged in satisfactory condition.  Final Clinical Impressions(s) / ED Diagnoses   Final diagnoses:  RUQ pain  Right sided abdominal pain  Cirrhosis of liver with ascites, unspecified hepatic cirrhosis type (HCC)     New Prescriptions   ONDANSETRON (ZOFRAN) 4 MG TABLET    Take 1 tablet (4 mg total) by mouth every 8 (eight) hours as needed for nausea or vomiting.       Sharlett Iles, MD 03/05/16 [phone]

## 2016-03-05 NOTE — ED Triage Notes (Signed)
Pt having RUQ abdominal pain this am.  Last night it was more in her back.  Some nausea.  Pt states she has some chills.

## 2016-03-08 ENCOUNTER — Encounter: Payer: Self-pay | Admitting: Family Medicine

## 2016-03-13 ENCOUNTER — Other Ambulatory Visit: Payer: Self-pay

## 2016-03-20 ENCOUNTER — Other Ambulatory Visit: Payer: 59

## 2016-03-22 ENCOUNTER — Other Ambulatory Visit (INDEPENDENT_AMBULATORY_CARE_PROVIDER_SITE_OTHER): Payer: No Typology Code available for payment source

## 2016-03-22 LAB — COMPREHENSIVE METABOLIC PANEL
ALBUMIN: 3.7 g/dL (ref 3.5–5.2)
ALK PHOS: 85 U/L (ref 39–117)
ALT: 42 U/L — ABNORMAL HIGH (ref 0–35)
AST: 44 U/L — ABNORMAL HIGH (ref 0–37)
BUN: 11 mg/dL (ref 6–23)
CALCIUM: 8.8 mg/dL (ref 8.4–10.5)
CHLORIDE: 102 meq/L (ref 96–112)
CO2: 25 mEq/L (ref 19–32)
Creatinine, Ser: 0.78 mg/dL (ref 0.40–1.20)
GFR: 80.08 mL/min (ref >60.00)
Glucose, Bld: 402 mg/dL — ABNORMAL HIGH (ref 70–99)
POTASSIUM: 4.3 meq/L (ref 3.5–5.1)
Sodium: 134 mEq/L — ABNORMAL LOW (ref 135–145)
TOTAL PROTEIN: 6 g/dL (ref 6.0–8.3)
Total Bilirubin: 0.7 mg/dL (ref 0.2–1.2)

## 2016-03-23 LAB — VITAMIN D 25 HYDROXY (VIT D DEFICIENCY, FRACTURES): VITD: 15.49 ng/mL — ABNORMAL LOW (ref 30.00–100.00)

## 2016-03-26 ENCOUNTER — Other Ambulatory Visit: Payer: Self-pay | Admitting: Family Medicine

## 2016-03-26 MED ORDER — ONETOUCH DELICA LANCETS 33G MISC: 5 refills | Status: DC

## 2016-03-26 MED ORDER — VITAMIN D (ERGOCALCIFEROL) 1.25 MG (50000 UNIT) PO CAPS: 50000.0000 [IU] | ORAL_CAPSULE | ORAL | 4 refills | Status: DC

## 2016-03-27 ENCOUNTER — Ambulatory Visit: Payer: 59 | Admitting: Family Medicine

## 2016-04-02 ENCOUNTER — Encounter: Payer: Self-pay | Admitting: Family Medicine

## 2016-04-03 ENCOUNTER — Other Ambulatory Visit: Payer: Self-pay | Admitting: Family Medicine

## 2016-04-03 MED ORDER — TEMAZEPAM 15 MG PO CAPS: 15.0000 mg | ORAL_CAPSULE | Freq: Every evening | ORAL | 0 refills | Status: DC | PRN

## 2016-04-03 NOTE — Telephone Encounter (Signed)
Faxed hardcopy of temazepam to [facility] [place].  Per patient aware from mychart.

## 2016-04-24 ENCOUNTER — Ambulatory Visit (INDEPENDENT_AMBULATORY_CARE_PROVIDER_SITE_OTHER): Payer: No Typology Code available for payment source | Admitting: Family Medicine

## 2016-04-24 ENCOUNTER — Encounter: Payer: Self-pay | Admitting: Family Medicine

## 2016-04-24 VITALS — BP 142/88 | HR 74 | Temp 98.2°F | Resp 18 | Wt 263.0 lb

## 2016-04-24 DIAGNOSIS — E669 Obesity, unspecified: Secondary | ICD-10-CM | POA: Diagnosis not present

## 2016-04-24 DIAGNOSIS — E1169 Type 2 diabetes mellitus with other specified complication: Secondary | ICD-10-CM | POA: Diagnosis not present

## 2016-04-24 DIAGNOSIS — E785 Hyperlipidemia, unspecified: Secondary | ICD-10-CM

## 2016-04-24 DIAGNOSIS — E559 Vitamin D deficiency, unspecified: Secondary | ICD-10-CM

## 2016-04-24 DIAGNOSIS — I10 Essential (primary) hypertension: Secondary | ICD-10-CM | POA: Diagnosis not present

## 2016-04-24 DIAGNOSIS — G4700 Insomnia, unspecified: Secondary | ICD-10-CM

## 2016-04-24 HISTORY — DX: Vitamin D deficiency, unspecified: E55.9

## 2016-04-24 LAB — COMPREHENSIVE METABOLIC PANEL
ALBUMIN: 3.8 g/dL (ref 3.5–5.2)
ALK PHOS: 80 U/L (ref 39–117)
ALT: 40 U/L — ABNORMAL HIGH (ref 0–35)
AST: 47 U/L — ABNORMAL HIGH (ref 0–37)
BUN: 12 mg/dL (ref 6–23)
CALCIUM: 9 mg/dL (ref 8.4–10.5)
CHLORIDE: 102 meq/L (ref 96–112)
CO2: 28 mEq/L (ref 19–32)
Creatinine, Ser: 0.79 mg/dL (ref 0.40–1.20)
GFR: 78.88 mL/min (ref >60.00)
Glucose, Bld: 310 mg/dL — ABNORMAL HIGH (ref 70–99)
POTASSIUM: 4.1 meq/L (ref 3.5–5.1)
SODIUM: 137 meq/L (ref 135–145)
Total Bilirubin: 0.8 mg/dL (ref 0.2–1.2)
Total Protein: 6.2 g/dL (ref 6.0–8.3)

## 2016-04-24 LAB — LIPID PANEL
CHOLESTEROL: 145 mg/dL (ref 0–200)
HDL: 60.9 mg/dL (ref >39.00)
LDL CALC: 63 mg/dL (ref 0–99)
NonHDL: 84.39
TRIGLYCERIDES: 108 mg/dL (ref 0.0–149.0)
Total CHOL/HDL Ratio: 2
VLDL: 21.6 mg/dL (ref 0.0–40.0)

## 2016-04-24 LAB — CBC
HEMATOCRIT: 42.9 % (ref 36.0–46.0)
HEMOGLOBIN: 14.3 g/dL (ref 12.0–15.0)
MCHC: 33.4 g/dL (ref 30.0–36.0)
MCV: 88.9 fl (ref 78.0–100.0)
PLATELETS: 135 10*3/uL — AB (ref 150.0–400.0)
RBC: 4.83 Mil/uL (ref 3.87–5.11)
RDW: 14.2 % (ref 11.5–15.5)
WBC: 6.6 10*3/uL (ref 4.0–10.5)

## 2016-04-24 LAB — HEMOGLOBIN A1C: Hgb A1c MFr Bld: 9 % — ABNORMAL HIGH (ref 4.6–6.5)

## 2016-04-24 LAB — TSH: TSH: 1.82 u[IU]/mL (ref 0.35–4.50)

## 2016-04-24 MED ORDER — TEMAZEPAM 7.5 MG PO CAPS: 7.5000 mg | ORAL_CAPSULE | Freq: Every evening | ORAL | 0 refills | Status: DC | PRN

## 2016-04-24 MED ORDER — TRAMADOL HCL 50 MG PO TABS: 50.0000 mg | ORAL_TABLET | Freq: Three times a day (TID) | ORAL | 0 refills | Status: DC | PRN

## 2016-04-24 MED ORDER — GLIMEPIRIDE 4 MG PO TABS: 4.0000 mg | ORAL_TABLET | Freq: Two times a day (BID) | ORAL | 3 refills | Status: DC

## 2016-04-24 NOTE — Assessment & Plan Note (Signed)
Given refill on Temazepam will drop to 7.5 it causes excessive sedation

## 2016-04-24 NOTE — Progress Notes (Signed)
Subjective:  I acted as a Education administrator for Dr. Charlett Blake. [place], [place]   Patient ID: Stephanie Cordova, female    DOB: 12-Nov-1955, 61 y.o.   MRN: [id number]  Chief Complaint  Patient presents with  . Follow-up  . Hypertension  . Diabetes    HPI  Patient is in today for follow up for on hypertension, diabetes and other medical concerns. Patient request refill on Tramadol, she states she has pain in both knees, she takes the Tramadol as needed and the medicine does help with the relief of pain. Pt also wants refill on temazepam, this medication helps her sleep she only takes is once a week. Feeling well but is struggling with hi blood sugars see A/P for numbers and unfortunately had stopped her Glimeperide. Denies CP/palp/SOB/HA/congestion/fevers/GI or GU c/o. Taking meds as prescribed  Patient Care Team: Mosie Lukes, MD as PCP - General (Family Medicine)   Past Medical History:  Diagnosis Date  . Arthritis    b/l knees, bone on bone  . Diabetes mellitus type 2 in obese ([place]) 12/21/2012   takes Amaryl daily  . History of bronchitis 03/2015  . History of colon polyps    benign  . Hypertension    takes Metoprolol and Lisinopril daily  . Insomnia   . Pneumonia    hx of > 5 yrs ago  . Sinusitis, acute 01/26/2016  . Vitamin D deficiency 04/24/2016    Past Surgical History:  Procedure Laterality Date  . ABDOMINAL HYSTERECTOMY  1996  . BREAST BIOPSY    . CHOLECYSTECTOMY N/A 09/06/2015   Procedure: LAPAROSCOPIC CHOLECYSTECTOMY;  Surgeon: Ralene Ok, MD;  Location: [facility];  Service: General;  Laterality: N/A;  . COLONOSCOPY WITH ESOPHAGOGASTRODUODENOSCOPY (EGD)    . KNEE ARTHROSCOPY Bilateral   . TONSILLECTOMY    . TUBAL LIGATION      Family History  Problem Relation Age of Onset  . Hypertension Mother   . Hyperlipidemia Mother   . Leukemia Mother   . Cancer Mother   . Hypertension Father   . Hyperlipidemia Father   . Diabetes Father   . Heart disease Father   . Alzheimer's  disease Father   . Multiple sclerosis Son   . Heart disease Maternal Grandfather     Social History   Social History  . Marital status: Widowed    Spouse name: N/A  . Number of children: N/A  . Years of education: N/A   Occupational History  . Customer service    Social History Main Topics  . Smoking status: Never Smoker  . Smokeless tobacco: Never Used  . Alcohol use No  . Drug use: No  . Sexual activity: No     Comment: lives alone, widowed in 2006   Other Topics Concern  . Not on file   Social History Narrative  . No narrative on file    Outpatient Medications Prior to Visit  Medication Sig Dispense Refill  . Blood Glucose Monitoring Suppl (ONE TOUCH ULTRA SYSTEM KIT) w/Device KIT Use as directed twice daily to check blood sugar.  DX E11.9 1 each 0  . Dulaglutide (TRULICITY) 0.09 QZ/3.0QT SOPN Inject 0.75 mg into the skin once a week. 2 mL 5  . furosemide (LASIX) 20 MG tablet TAKE 1 TABLET BY MOUTH EVERY DAY AS NEEDED FOR EDEMA 30 tablet 0  . glucose blood (ONETOUCH VERIO) test strip 1 each by Other route 2 (two) times daily. And lancets 2/day 100 each 12  .  lisinopril (PRINIVIL,ZESTRIL) 10 MG tablet Take 1 tablet (10 mg total) by mouth daily. 30 tablet 3  . ondansetron (ZOFRAN) 4 MG tablet Take 1 tablet (4 mg total) by mouth every 8 (eight) hours as needed for nausea or vomiting. 6 tablet 0  . ONE TOUCH ULTRA TEST test strip USE AS DIRECTED TWICE A DAY 100 each 2  . ONETOUCH DELICA LANCETS 16S MISC Use as directed twice daily to check blood sugar.  DX E11.9 100 each 5  . Vitamin D, Ergocalciferol, (DRISDOL) 50000 units CAPS capsule Take 1 capsule (50,000 Units total) by mouth every 7 (seven) days. 4 capsule 4  . temazepam (RESTORIL) 15 MG capsule Take 1 capsule (15 mg total) by mouth at bedtime as needed for sleep. 10 capsule 0  . traMADol (ULTRAM) 50 MG tablet Take 1 tablet (50 mg total) by mouth every 8 (eight) hours as needed. 30 tablet 0   No facility-administered  medications prior to visit.     Allergies  Allergen Reactions  . Losartan Palpitations  . Lexapro [Escitalopram] Other (See Comments)    swelling  . Sulfa Antibiotics Hives and Rash    "Burning" rash    Review of Systems  Constitutional: Positive for malaise/fatigue. Negative for fever.  HENT: Negative for congestion.   Eyes: Negative for blurred vision.  Respiratory: Negative for shortness of breath.   Cardiovascular: Negative for chest pain, palpitations and leg swelling.  Gastrointestinal: Negative for abdominal pain, blood in stool and nausea.  Genitourinary: Negative for dysuria and frequency.  Musculoskeletal: Negative for falls.  Skin: Negative for rash.  Neurological: Negative for dizziness, loss of consciousness and headaches.  Endo/Heme/Allergies: Negative for environmental allergies.  Psychiatric/Behavioral: Negative for depression. The patient is not nervous/anxious.        Objective:    Physical Exam  Constitutional: She is oriented to person, place, and time. She appears well-developed and well-nourished. No distress.  HENT:  Head: Normocephalic and atraumatic.  Eyes: Conjunctivae are normal.  Neck: Neck supple. No thyromegaly present.  Cardiovascular: Normal rate, regular rhythm and normal heart sounds.   No murmur heard. Pulmonary/Chest: Effort normal and breath sounds normal. No respiratory distress.  Abdominal: Soft. Bowel sounds are normal. She exhibits no distension and no mass. There is no tenderness.  Musculoskeletal: She exhibits no edema.  Lymphadenopathy:    She has no cervical adenopathy.  Neurological: She is alert and oriented to person, place, and time.  Skin: Skin is warm and dry.  Psychiatric: She has a normal mood and affect. Her behavior is normal.    BP (!) 142/88 (BP Location: Left Arm, Patient Position: Sitting, Cuff Size: Large)   Pulse 74   Temp 98.2 F (36.8 C) (Oral)   Resp 18   Wt 263 lb (119.3 kg)   LMP 01/29/1994   SpO2  98%   BMI 46.59 kg/m  Wt Readings from Last 3 Encounters:  04/24/16 263 lb (119.3 kg)  03/05/16 260 lb (117.9 kg)  01/26/16 257 lb (116.6 kg)   BP Readings from Last 3 Encounters:  04/24/16 (!) 142/88  03/05/16 151/81  01/26/16 122/72     Immunization History  Administered Date(s) Administered  . Pneumococcal-Unspecified 12/30/2011  . Td 01/29/2009    Health Maintenance  Topic Date Due  . Hepatitis C Screening  1955/12/04  . FOOT EXAM  04/01/1966  . HIV Screening  04/01/1971  . INFLUENZA VACCINE  10/25/2016 (Originally 08/30/2015)  . PAP SMEAR  04/24/2017 (Originally 01/29/2013)  . HEMOGLOBIN  A1C  06/07/2016  . PNEUMOCOCCAL POLYSACCHARIDE VACCINE (2) 12/29/2016  . OPHTHALMOLOGY EXAM  12/29/2016  . MAMMOGRAM  06/06/2017  . TETANUS/TDAP  01/30/2019  . COLONOSCOPY  11/27/2022    Lab Results  Component Value Date   WBC 9.3 03/05/2016   HGB 13.1 03/05/2016   HCT 38.3 03/05/2016   PLT 130 (L) 03/05/2016   GLUCOSE 402 (H) 03/22/2016   CHOL 156 12/09/2015   TRIG 93.0 12/09/2015   HDL 63.00 12/09/2015   LDLCALC 75 12/09/2015   ALT 42 (H) 03/22/2016   AST 44 (H) 03/22/2016   NA 134 (L) 03/22/2016   K 4.3 03/22/2016   CL 102 03/22/2016   CREATININE 0.78 03/22/2016   BUN 11 03/22/2016   CO2 25 03/22/2016   TSH 2.06 12/09/2015   HGBA1C 10.0 (H) 12/09/2015   MICROALBUR 2.6 (H) 12/09/2015    Lab Results  Component Value Date   TSH 2.06 12/09/2015   Lab Results  Component Value Date   WBC 9.3 03/05/2016   HGB 13.1 03/05/2016   HCT 38.3 03/05/2016   MCV 88.5 03/05/2016   PLT 130 (L) 03/05/2016   Lab Results  Component Value Date   NA 134 (L) 03/22/2016   K 4.3 03/22/2016   CO2 25 03/22/2016   GLUCOSE 402 (H) 03/22/2016   BUN 11 03/22/2016   CREATININE 0.78 03/22/2016   BILITOT 0.7 03/22/2016   ALKPHOS 85 03/22/2016   AST 44 (H) 03/22/2016   ALT 42 (H) 03/22/2016   PROT 6.0 03/22/2016   ALBUMIN 3.7 03/22/2016   CALCIUM 8.8 03/22/2016   ANIONGAP 9  03/05/2016   GFR 80.08 03/22/2016   Lab Results  Component Value Date   CHOL 156 12/09/2015   Lab Results  Component Value Date   HDL 63.00 12/09/2015   Lab Results  Component Value Date   LDLCALC 75 12/09/2015   Lab Results  Component Value Date   TRIG 93.0 12/09/2015   Lab Results  Component Value Date   CHOLHDL 2 12/09/2015   Lab Results  Component Value Date   HGBA1C 10.0 (H) 12/09/2015         Assessment & Plan:   Problem List Items Addressed This Visit    Insomnia    Given refill on Temazepam will drop to 7.5 it causes excessive sedation      HTN (hypertension) - Primary    Well controlled, no changes to meds. Encouraged heart healthy diet such as the DASH diet and exercise as tolerated.       Relevant Orders   CBC   Comprehensive metabolic panel   TSH   Diabetes mellitus type 2 in obese ([place])    hgba1c unacceptable on last check minimize simple carbs. Increase exercise as tolerated. Continue current meds for now and recheck hgba1c today. Patient was referred to endocrinology but she has bad insurance so her copay is too bad so she cannot afford to go back. She reports her sugars staying fasting around 200 and can spike over 300. No polyuria or polydipsia. She had stopped the Glimeperide she agrees to restart      Relevant Medications   HUMALOG KWIKPEN 100 UNIT/ML KiwkPen   glimepiride (AMARYL) 4 MG tablet   Other Relevant Orders   Hemoglobin A1c   Vitamin D deficiency    Has been taking 50000 IU weekly,also taking vitamin D 2000 IU daily check level today      Hyperlipidemia   Relevant Orders   Lipid panel  I have discontinued Ms. Hardrick's temazepam. I am also having her start on temazepam. Additionally, I am having her maintain her ONE TOUCH ULTRA SYSTEM KIT, ONE TOUCH ULTRA TEST, lisinopril, Dulaglutide, glucose blood, furosemide, ondansetron, Vitamin D (Ergocalciferol), ONETOUCH DELICA LANCETS 27N, HUMALOG KWIKPEN, B-D ULTRAFINE III SHORT  PEN, traMADol, and glimepiride.  Meds ordered this encounter  Medications  . HUMALOG KWIKPEN 100 UNIT/ML KiwkPen    Sig: Inject 45 mg as directed 3 (three) times daily.  . B-D ULTRAFINE III SHORT PEN 31G X 8 MM MISC    Sig: Inject 1 pen as directed 3 (three) times daily.  . traMADol (ULTRAM) 50 MG tablet    Sig: Take 1 tablet (50 mg total) by mouth every 8 (eight) hours as needed.    Dispense:  30 tablet    Refill:  0  . temazepam (RESTORIL) 7.5 MG capsule    Sig: Take 1 capsule (7.5 mg total) by mouth at bedtime as needed for sleep.    Dispense:  30 capsule    Refill:  0  . glimepiride (AMARYL) 4 MG tablet    Sig: Take 1 tablet (4 mg total) by mouth 2 (two) times daily.    Dispense:  60 tablet    Refill:  3    CMA served as scribe during this visit. History, Physical and Plan performed by medical provider. Documentation and orders reviewed and attested to.  Penni Homans, MD

## 2016-04-24 NOTE — Assessment & Plan Note (Signed)
Encouraged DASH diet, decrease po intake and increase exercise as tolerated. Needs 7-8 hours of sleep nightly. Avoid trans fats, eat small, frequent meals every 4-5 hours with lean proteins, complex carbs and healthy fats. Minimize simple carbs 

## 2016-04-24 NOTE — Patient Instructions (Addendum)
Soak feet in distilled vinegar and hot water  Rub cream or ointment on feet daily to prevent dry feet.    Blood Glucose Monitoring, Adult Monitoring your blood sugar (glucose) helps you manage your diabetes. It also helps you and your health care provider determine how well your diabetes management plan is working. Blood glucose monitoring involves checking your blood glucose as often as directed, and keeping a record (log) of your results over time. Why should I monitor my blood glucose? Checking your blood glucose regularly can:  Help you understand how food, exercise, illnesses, and medicines affect your blood glucose.  Let you know what your blood glucose is at any time. You can quickly tell if you are having low blood glucose (hypoglycemia) or high blood glucose (hyperglycemia).  Help you and your health care provider adjust your medicines as needed. When should I check my blood glucose? Follow instructions from your health care provider about how often to check your blood glucose. This may depend on:  The type of diabetes you have.  How well-controlled your diabetes is.  Medicines you are taking. If you have type 1 diabetes:   Check your blood glucose at least 2 times a day.  Also check your blood glucose:  Before every insulin injection.  Before and after exercise.  Between meals.  2 hours after a meal.  Occasionally between 2:00 a.m. and 3:00 a.m., as directed.  Before potentially dangerous tasks, like driving or using heavy machinery.  At bedtime.  You may need to check your blood glucose more often, up to 6-10 times a day:  If you use an insulin pump.  If you need multiple daily injections (MDI).  If your diabetes is not well-controlled.  If you are ill.  If you have a history of severe hypoglycemia.  If you have a history of not knowing when your blood glucose is getting low (hypoglycemia unawareness). If you have type 2 diabetes:   If you take  insulin or other diabetes medicines, check your blood glucose at least 2 times a day.  If you are on intensive insulin therapy, check your blood glucose at least 4 times a day. Occasionally, you may also need to check between 2:00 a.m. and 3:00 a.m., as directed.  Also check your blood glucose:  Before and after exercise.  Before potentially dangerous tasks, like driving or using heavy machinery.  You may need to check your blood glucose more often if:  Your medicine is being adjusted.  Your diabetes is not well-controlled.  You are ill. What is a blood glucose log?  A blood glucose log is a record of your blood glucose readings. It helps you and your health care provider:  Look for patterns in your blood glucose over time.  Adjust your diabetes management plan as needed.  Every time you check your blood glucose, write down your result and notes about things that may be affecting your blood glucose, such as your diet and exercise for the day.  Most glucose meters store a record of glucose readings in the meter. Some meters allow you to download your records to a computer. How do I check my blood glucose? Follow these steps to get accurate readings of your blood glucose: Supplies needed    Blood glucose meter.  Test strips for your meter. Each meter has its own strips. You must use the strips that come with your meter.  A needle to prick your finger (lancet). Do not use lancets more  than once.  A device that holds the lancet (lancing device).  A journal or log book to write down your results. Procedure   Wash your hands with soap and water.  Prick the side of your finger (not the tip) with the lancet. Use a different finger each time.  Gently rub the finger until a small drop of blood appears.  Follow instructions that come with your meter for inserting the test strip, applying blood to the strip, and using your blood glucose meter.  Write down your result and any  notes. Alternative testing sites   Some meters allow you to use areas of your body other than your finger (alternative sites) to test your blood.  If you think you may have hypoglycemia, or if you have hypoglycemia unawareness, do not use alternative sites. Use your finger instead.  Alternative sites may not be as accurate as the fingers, because blood flow is slower in these areas. This means that the result you get may be delayed, and it may be different from the result that you would get from your finger.  The most common alternative sites are:  Forearm.  Thigh.  Palm of the hand. Additional tips   Always keep your supplies with you.  If you have questions or need help, all blood glucose meters have a 24-hour "hotline" number that you can call. You may also contact your health care provider.  After you use a few boxes of test strips, adjust (calibrate) your blood glucose meter by following instructions that came with your meter. This information is not intended to replace advice given to you by your health care provider. Make sure you discuss any questions you have with your health care provider. Document Released: 01/18/2003 Document Revised: 08/05/2015 Document Reviewed: 06/27/2015 Elsevier Interactive Patient Education  2017 [employer]

## 2016-04-24 NOTE — Assessment & Plan Note (Signed)
Well controlled, no changes to meds. Encouraged heart healthy diet such as the DASH diet and exercise as tolerated.  °

## 2016-04-24 NOTE — Assessment & Plan Note (Signed)
Knees hurt daily uses Tramadol infrequently for severe pain with over use, refill given today with [facility] and new contract

## 2016-04-24 NOTE — Assessment & Plan Note (Addendum)
hgba1c unacceptable on last check minimize simple carbs. Increase exercise as tolerated. Continue current meds for now and recheck hgba1c today. Patient was referred to endocrinology but she has bad insurance so her copay is too bad so she cannot afford to go back. She reports her sugars staying fasting around 200 and can spike over 300. No polyuria or polydipsia. She had stopped the Glimeperide she agrees to restart

## 2016-04-24 NOTE — Assessment & Plan Note (Signed)
Has been taking [id number] IU weekly,also taking vitamin D 2000 IU daily check level today

## 2016-04-24 NOTE — Progress Notes (Signed)
Pre visit review using our clinic review tool, if applicable. No additional management support is needed unless otherwise documented below in the visit note. 

## 2016-04-26 LAB — VITAMIN D 1,25 DIHYDROXY
VITAMIN D 1, 25 (OH) TOTAL: 41 pg/mL (ref 18–72)
VITAMIN D3 1, 25 (OH): 18 pg/mL
Vitamin D2 1, 25 (OH)2: 23 pg/mL

## 2016-05-09 ENCOUNTER — Encounter: Payer: Self-pay | Admitting: Medical

## 2016-05-09 ENCOUNTER — Ambulatory Visit (INDEPENDENT_AMBULATORY_CARE_PROVIDER_SITE_OTHER): Payer: No Typology Code available for payment source | Admitting: Medical

## 2016-05-09 VITALS — BP 165/95 | HR 92 | Temp 98.1°F | Resp 16 | Ht 61.0 in | Wt 258.8 lb

## 2016-05-09 DIAGNOSIS — H00011 Hordeolum externum right upper eyelid: Secondary | ICD-10-CM | POA: Diagnosis not present

## 2016-05-09 MED ORDER — AMOXICILLIN-POT CLAVULANATE 875-125 MG PO TABS: 1.0000 | ORAL_TABLET | Freq: Two times a day (BID) | ORAL | 0 refills | Status: DC

## 2016-05-09 MED ORDER — TOBRAMYCIN 0.3 % OP SOLN: 2.0000 [drp] | Freq: Four times a day (QID) | OPHTHALMIC | 0 refills | Status: DC

## 2016-05-09 NOTE — Patient Instructions (Signed)
You appear to have moderate to large stye. Will rx tobrex eye drops and continue the warm compresses twice daily. If you have worse swelling to your lid then start augmentin antibiotic.  Please take your bp meds when you get home since bp is high and you did not take meds. If you get any cardiac or neurologic signs or symptoms then ED evaluation.  Follow up in 5-7 days or as needed

## 2016-05-09 NOTE — Progress Notes (Signed)
Subjective:    Patient ID: Stephanie Cordova, female    DOB: 1955-11-24, 61 y.o.   MRN: [id number]  HPI  Pt in with some rt upper eye lid swelling and mild  redness. Pt states swollen for about 1 week. Pt tried warm compresses and zyrtec. Area some improveed but not completley. No colored discharge. No blister outbreak around eye. Some clear watery drainage.   No vision changes. No ocular pain.  Review of Systems  Eyes: Positive for discharge. Negative for photophobia, pain, redness, itching and visual disturbance.       Rt upper lid mid irritated and itchy.  Watery dc.  Respiratory: Negative for cough, chest tightness, shortness of breath and wheezing.   Cardiovascular: Negative for chest pain and palpitations.  Neurological: Negative for dizziness, seizures, syncope, speech difficulty, weakness, numbness and headaches.       Pt did not take her bp med this am. She will take when she gets home.   Past Medical History:  Diagnosis Date  . Arthritis    b/l knees, bone on bone  . Diabetes mellitus type 2 in obese ([place]) 12/21/2012   takes Amaryl daily  . History of bronchitis 03/2015  . History of colon polyps    benign  . Hypertension    takes Metoprolol and Lisinopril daily  . Insomnia   . Pneumonia    hx of > 5 yrs ago  . Sinusitis, acute 01/26/2016  . Vitamin D deficiency 04/24/2016     Social History   Social History  . Marital status: Widowed    Spouse name: N/A  . Number of children: N/A  . Years of education: N/A   Occupational History  . Customer service    Social History Main Topics  . Smoking status: Never Smoker  . Smokeless tobacco: Never Used  . Alcohol use No  . Drug use: No  . Sexual activity: No     Comment: lives alone, widowed in 2006   Other Topics Concern  . Not on file   Social History Narrative  . No narrative on file    Past Surgical History:  Procedure Laterality Date  . ABDOMINAL HYSTERECTOMY  1996  . BREAST BIOPSY    .  CHOLECYSTECTOMY N/A 09/06/2015   Procedure: LAPAROSCOPIC CHOLECYSTECTOMY;  Surgeon: Ralene Ok, MD;  Location: [facility];  Service: General;  Laterality: N/A;  . COLONOSCOPY WITH ESOPHAGOGASTRODUODENOSCOPY (EGD)    . KNEE ARTHROSCOPY Bilateral   . TONSILLECTOMY    . TUBAL LIGATION      Family History  Problem Relation Age of Onset  . Hypertension Mother   . Hyperlipidemia Mother   . Leukemia Mother   . Cancer Mother   . Hypertension Father   . Hyperlipidemia Father   . Diabetes Father   . Heart disease Father   . Alzheimer's disease Father   . Multiple sclerosis Son   . Heart disease Maternal Grandfather     Allergies  Allergen Reactions  . Losartan Palpitations  . Lexapro [Escitalopram] Other (See Comments)    swelling  . Sulfa Antibiotics Hives and Rash    "Burning" rash    Current Outpatient Prescriptions on File Prior to Visit  Medication Sig Dispense Refill  . B-D ULTRAFINE III SHORT PEN 31G X 8 MM MISC Inject 1 pen as directed 3 (three) times daily.    . Blood Glucose Monitoring Suppl (ONE TOUCH ULTRA SYSTEM KIT) w/Device KIT Use as directed twice daily to check blood sugar.  DX E11.9 1 each 0  . Dulaglutide (TRULICITY) 2.09 OB/0.9GG SOPN Inject 0.75 mg into the skin once a week. 2 mL 5  . furosemide (LASIX) 20 MG tablet TAKE 1 TABLET BY MOUTH EVERY DAY AS NEEDED FOR EDEMA 30 tablet 0  . glimepiride (AMARYL) 4 MG tablet Take 1 tablet (4 mg total) by mouth 2 (two) times daily. 60 tablet 3  . glucose blood (ONETOUCH VERIO) test strip 1 each by Other route 2 (two) times daily. And lancets 2/day 100 each 12  . HUMALOG KWIKPEN 100 UNIT/ML KiwkPen Inject 45 mg as directed 3 (three) times daily.    Marland Kitchen lisinopril (PRINIVIL,ZESTRIL) 10 MG tablet Take 1 tablet (10 mg total) by mouth daily. 30 tablet 3  . ondansetron (ZOFRAN) 4 MG tablet Take 1 tablet (4 mg total) by mouth every 8 (eight) hours as needed for nausea or vomiting. 6 tablet 0  . ONE TOUCH ULTRA TEST test strip USE AS  DIRECTED TWICE A DAY 100 each 2  . ONETOUCH DELICA LANCETS 83M MISC Use as directed twice daily to check blood sugar.  DX E11.9 100 each 5  . temazepam (RESTORIL) 7.5 MG capsule Take 1 capsule (7.5 mg total) by mouth at bedtime as needed for sleep. 30 capsule 0  . traMADol (ULTRAM) 50 MG tablet Take 1 tablet (50 mg total) by mouth every 8 (eight) hours as needed. 30 tablet 0  . Vitamin D, Ergocalciferol, (DRISDOL) 50000 units CAPS capsule Take 1 capsule (50,000 Units total) by mouth every 7 (seven) days. 4 capsule 4   No current facility-administered medications on file prior to visit.     BP (!) 165/95   Pulse 92   Temp 98.1 F (36.7 C) (Oral)   Resp 16   Ht 5' 1"  (1.549 m)   Wt 258 lb 12.8 oz (117.4 kg)   LMP 01/29/1994   SpO2 98%   BMI 48.90 kg/m        Objective:   Physical Exam  General- No acute distress. Pleasant patient. Neck- Full range of motion, no jvd Lungs- Clear, even and unlabored. Heart- regular rate and rhythm. Neurologic- CNII- XII grossly intact.  Rt eye- moderate redness across upper eyelid region. About half of eyelid. No dc. Conjunctiva not infected. Moderate sized stye mid aspect of upper lid.       Assessment & Plan:  You appear to have moderate to large stye. Will rx tobrex eye drops and continue the warm compresses twice daily. If you have worse swelling to your lid then start augmentin antibiotic.  Please take your bp meds when you get home since bp is high and you did not take meds. If you get any cardiac or neurologic signs or symptoms then ED evaluation.  Follow up in 5-7 days or as needed

## 2016-05-09 NOTE — Progress Notes (Signed)
Pre visit review using our clinic review tool, if applicable. No additional management support is needed unless otherwise documented below in the visit note. 

## 2016-05-15 ENCOUNTER — Other Ambulatory Visit: Payer: Self-pay | Admitting: Family Medicine

## 2016-05-15 MED ORDER — DULAGLUTIDE 0.75 MG/0.5ML ~~LOC~~ SOAJ: 0.7500 mg | SUBCUTANEOUS | 5 refills | Status: DC

## 2016-05-22 ENCOUNTER — Encounter: Payer: Self-pay | Admitting: Family Medicine

## 2016-05-22 ENCOUNTER — Other Ambulatory Visit: Payer: Self-pay | Admitting: Family Medicine

## 2016-05-22 MED ORDER — FLUCONAZOLE 150 MG PO TABS: 150.0000 mg | ORAL_TABLET | ORAL | 0 refills | Status: DC

## 2016-06-04 ENCOUNTER — Encounter: Payer: Self-pay | Admitting: Family Medicine

## 2016-06-04 ENCOUNTER — Other Ambulatory Visit: Payer: Self-pay | Admitting: Family Medicine

## 2016-06-04 DIAGNOSIS — M25562 Pain in left knee: Secondary | ICD-10-CM

## 2016-06-05 ENCOUNTER — Ambulatory Visit: Payer: 59 | Admitting: Family Medicine

## 2016-07-03 ENCOUNTER — Ambulatory Visit: Payer: No Typology Code available for payment source | Admitting: Family Medicine

## 2016-07-03 ENCOUNTER — Ambulatory Visit (INDEPENDENT_AMBULATORY_CARE_PROVIDER_SITE_OTHER): Payer: No Typology Code available for payment source | Admitting: Medical

## 2016-07-03 ENCOUNTER — Encounter: Payer: Self-pay | Admitting: Medical

## 2016-07-03 VITALS — BP 140/88 | HR 96 | Temp 98.3°F | Ht 61.0 in | Wt 262.0 lb

## 2016-07-03 DIAGNOSIS — R300 Dysuria: Secondary | ICD-10-CM | POA: Diagnosis not present

## 2016-07-03 DIAGNOSIS — H00011 Hordeolum externum right upper eyelid: Secondary | ICD-10-CM

## 2016-07-03 DIAGNOSIS — H6690 Otitis media, unspecified, unspecified ear: Secondary | ICD-10-CM | POA: Diagnosis not present

## 2016-07-03 DIAGNOSIS — R102 Pelvic and perineal pain: Secondary | ICD-10-CM

## 2016-07-03 LAB — POCT URINALYSIS DIPSTICK
BILIRUBIN UA: NEGATIVE
CLARITY UA: NEGATIVE
GLUCOSE UA: NEGATIVE
Ketones, UA: NEGATIVE
LEUKOCYTES UA: NEGATIVE
NITRITE UA: NEGATIVE
Protein, UA: NEGATIVE
RBC UA: NEGATIVE
Spec Grav, UA: 1.025 (ref 1.010–1.025)
Urobilinogen, UA: NEGATIVE E.U./dL — AB
pH, UA: 6 (ref 5.0–8.0)

## 2016-07-03 MED ORDER — TOBRAMYCIN 0.3 % OP SOLN: 2.0000 [drp] | Freq: Four times a day (QID) | OPHTHALMIC | 0 refills | Status: DC

## 2016-07-03 MED ORDER — CEPHALEXIN 500 MG PO CAPS
500.0000 mg | ORAL_CAPSULE | Freq: Two times a day (BID) | ORAL | 0 refills | Status: DC
Start: 2016-07-03 — End: 2016-08-07

## 2016-07-03 MED ORDER — FLUTICASONE PROPIONATE 50 MCG/ACT NA SUSP: 2.0000 | Freq: Every day | NASAL | 1 refills | Status: DC

## 2016-07-03 NOTE — Progress Notes (Signed)
Pre visit review using our clinic review tool, if applicable. No additional management support is needed unless otherwise documented below in the visit note. 

## 2016-07-03 NOTE — Patient Instructions (Addendum)
For possible uti based on dysuria and suprapubic pain will get urine culture. Will go ahead and rx keflex antibiotic pending urine culture.  For ear infection and sinus pressure/infection  keflex may work as well. Rx flonase made available if you have nasal congestion.  For residual stye can do warm compresses, keflex and refill you tobrex eye drops. If this still persists or worsens can refer to eye MD as you have struggles with upper lid issues already 2 months.  Follow up in 7-10 days or as needed

## 2016-07-03 NOTE — Progress Notes (Addendum)
Subjective:    Patient ID: Stephanie Cordova, female    DOB: [birth year], 61 y.o.   MRN: [id number]  HPI  Pt has chronic intermittent rt upper eye lid stye. Pt states gets better and then worse. But never completely better. Pt has been using eye drops intermittently.   On last visit had larger stye and concern for cellulitis. Pt could no take augmentin made her stomach upset and cause some loose stools and vomiting. But eye got a lot better just with drops. Only small minimal stye present now per pt.  Pt also report rt ear pain for one week. Pt had some mild rt side sinus pain over past week.   3rd complaint is pain over suprapubic area for about one week. Some mild pain rt cva area at times. Some pain after she urinates. Hx rare uti in past.  Review of Systems  Constitutional: Negative for chills, fatigue and fever.  HENT: Positive for sinus pain and sinus pressure. Negative for congestion, mouth sores, nosebleeds and postnasal drip.   Eyes: Negative for pain and itching.       Residual stye rt upper eye lid.  Respiratory: Negative for cough, choking, chest tightness, shortness of breath and wheezing.   Cardiovascular: Negative for chest pain and palpitations.  Gastrointestinal: Negative for abdominal pain and constipation.  Genitourinary: Positive for dysuria. Negative for difficulty urinating, flank pain, frequency, hematuria, menstrual problem and urgency.       Suprapubic pain.  Musculoskeletal: Negative for back pain and neck pain.  Neurological: Negative for dizziness, seizures, syncope, weakness, numbness and headaches.  Hematological: Negative for adenopathy. Does not bruise/bleed easily.  Psychiatric/Behavioral: Negative for confusion.    Past Medical History:  Diagnosis Date  . Arthritis    b/l knees, bone on bone  . Diabetes mellitus type 2 in obese ([place]) 12/21/2012   takes Amaryl daily  . History of bronchitis 03/2015  . History of colon polyps    benign  . Hypertension     takes Metoprolol and Lisinopril daily  . Insomnia   . Pneumonia    hx of > 5 yrs ago  . Sinusitis, acute 01/26/2016  . Vitamin D deficiency 04/24/2016     Social History   Social History  . Marital status: Widowed    Spouse name: N/A  . Number of children: N/A  . Years of education: N/A   Occupational History  . Customer service    Social History Main Topics  . Smoking status: Never Smoker  . Smokeless tobacco: Never Used  . Alcohol use No  . Drug use: No  . Sexual activity: No     Comment: lives alone, widowed in 2006   Other Topics Concern  . Not on file   Social History Narrative  . No narrative on file    Past Surgical History:  Procedure Laterality Date  . ABDOMINAL HYSTERECTOMY  1996  . BREAST BIOPSY    . CHOLECYSTECTOMY N/A 09/06/2015   Procedure: LAPAROSCOPIC CHOLECYSTECTOMY;  Surgeon: Ralene Ok, MD;  Location: [facility];  Service: General;  Laterality: N/A;  . COLONOSCOPY WITH ESOPHAGOGASTRODUODENOSCOPY (EGD)    . KNEE ARTHROSCOPY Bilateral   . TONSILLECTOMY    . TUBAL LIGATION      Family History  Problem Relation Age of Onset  . Hypertension Mother   . Hyperlipidemia Mother   . Leukemia Mother   . Cancer Mother   . Hypertension Father   . Hyperlipidemia Father   . Diabetes  Father   . Heart disease Father   . Alzheimer's disease Father   . Multiple sclerosis Son   . Heart disease Maternal Grandfather     Allergies  Allergen Reactions  . Losartan Palpitations  . Lexapro [Escitalopram] Other (See Comments)    swelling  . Sulfa Antibiotics Hives and Rash    "Burning" rash    Current Outpatient Prescriptions on File Prior to Visit  Medication Sig Dispense Refill  . amoxicillin-clavulanate (AUGMENTIN) 875-125 MG tablet Take 1 tablet by mouth 2 (two) times daily. 20 tablet 0  . B-D ULTRAFINE III SHORT PEN 31G X 8 MM MISC Inject 1 pen as directed 3 (three) times daily.    . Blood Glucose Monitoring Suppl (ONE TOUCH ULTRA SYSTEM KIT)  w/Device KIT Use as directed twice daily to check blood sugar.  DX E11.9 1 each 0  . Dulaglutide (TRULICITY) 2.11 HE/1.7EY SOPN Inject 0.75 mg into the skin once a week. 2 mL 5  . fluconazole (DIFLUCAN) 150 MG tablet Take 1 tablet (150 mg total) by mouth once a week. 2 tablet 0  . furosemide (LASIX) 20 MG tablet TAKE 1 TABLET BY MOUTH EVERY DAY AS NEEDED FOR EDEMA 30 tablet 0  . glimepiride (AMARYL) 4 MG tablet Take 1 tablet (4 mg total) by mouth 2 (two) times daily. 60 tablet 3  . glucose blood (ONETOUCH VERIO) test strip 1 each by Other route 2 (two) times daily. And lancets 2/day 100 each 12  . HUMALOG KWIKPEN 100 UNIT/ML KiwkPen Inject 45 mg as directed 3 (three) times daily.    Marland Kitchen lisinopril (PRINIVIL,ZESTRIL) 10 MG tablet Take 1 tablet (10 mg total) by mouth daily. 30 tablet 3  . ondansetron (ZOFRAN) 4 MG tablet Take 1 tablet (4 mg total) by mouth every 8 (eight) hours as needed for nausea or vomiting. 6 tablet 0  . ONE TOUCH ULTRA TEST test strip USE AS DIRECTED TWICE A DAY 100 each 2  . ONETOUCH DELICA LANCETS 81K MISC Use as directed twice daily to check blood sugar.  DX E11.9 100 each 5  . temazepam (RESTORIL) 7.5 MG capsule Take 1 capsule (7.5 mg total) by mouth at bedtime as needed for sleep. 30 capsule 0  . tobramycin (TOBREX) 0.3 % ophthalmic solution Place 2 drops into the right eye every 6 (six) hours. 5 mL 0  . traMADol (ULTRAM) 50 MG tablet Take 1 tablet (50 mg total) by mouth every 8 (eight) hours as needed. 30 tablet 0  . Vitamin D, Ergocalciferol, (DRISDOL) 50000 units CAPS capsule Take 1 capsule (50,000 Units total) by mouth every 7 (seven) days. 4 capsule 4   No current facility-administered medications on file prior to visit.     BP (!) 167/83 (BP Location: Left Arm, Patient Position: Sitting, Cuff Size: Large)   Pulse 96   Temp 98.3 F (36.8 C) (Oral)   Ht 5' 1"  (1.549 m)   Wt 262 lb (118.8 kg)   LMP 01/29/1994   SpO2 99%   BMI 49.50 kg/m   Recheck bp on day of  service with manual cuff and more appropiate size was 140/88.     Objective:   Physical Exam  General  Mental Status - Alert. General Appearance - Well groomed. Not in acute distress.  Skin Rashes- No Rashes.  HEENT Head- Normal. Ear Auditory Canal - Left- Normal. Right - Normal.Tympanic Membrane- Left- moderate red. Right- faint dull.(interesting rt ear pain but left side looks worse) Eye Sclera/Conjunctiva- Left- Normal.  Right- Normal.(rt upper eye lid mid portion very faint almost resolved stye) Nose & Sinuses Nasal Mucosa- Left-  Not Boggy and Congested. Right-  Not  Boggy and  Congested.Bilateral  Faint rt side maxillary pressure but no  frontal sinus pressure. Mouth & Throat Lips: Upper Lip- Normal: no dryness, cracking, pallor, cyanosis, or vesicular eruption. Lower Lip-Normal: no dryness, cracking, pallor, cyanosis or vesicular eruption. Buccal Mucosa- Bilateral- No Aphthous ulcers. Oropharynx- No Discharge or Erythema. Tonsils: Characteristics- Bilateral- No Erythema or Congestion. Size/Enlargement- Bilateral- No enlargement. Discharge- bilateral-None.  Neck Neck- Supple. No Masses.   Chest and Lung Exam Auscultation: Breath Sounds:-Clear even and unlabored.  Cardiovascular Auscultation:Rythm- Regular, rate and rhythm. Murmurs & Other Heart Sounds:Ausculatation of the heart reveal- No Murmurs.  Lymphatic Head & Neck General Head & Neck Lymphatics: Bilateral: Description- No Localized lymphadenopathy.   Abdomen Inspection:-Inspection Normal.  Palpation/Perucssion: Palpation and Percussion of the abdomen reveal- suprapubic region faint tender, No Rebound tenderness, No rigidity(Guarding) and No Palpable abdominal masses.  Liver:-Normal.  Spleen:- Normal.   Back- no cva tenderness.        Assessment & Plan:  For possible uti based on dysuria and suprapubic pain will get urine culture. Will go ahead and rx keflex antibiotic pending urine culture.  For  ear infection and sinus pressure keflex may work as well. Rx flonase made available if you have nasal congestion.  For residual stye can do warm compresses, keflex and refill your tobrex eye drops. If this still persists or worsens can refer to eye MD as you have struggles with upper lid issues already 2 months.  Follow up in 7-10 days or as needed     Stephanie Cordova, Percell Miller, Stephanie Cordova

## 2016-07-04 LAB — URINE CULTURE

## 2016-07-10 ENCOUNTER — Other Ambulatory Visit: Payer: Self-pay | Admitting: Family Medicine

## 2016-07-10 MED ORDER — VITAMIN D (ERGOCALCIFEROL) 1.25 MG (50000 UNIT) PO CAPS: 50000.0000 [IU] | ORAL_CAPSULE | ORAL | 0 refills | Status: DC

## 2016-07-24 ENCOUNTER — Ambulatory Visit: Payer: No Typology Code available for payment source | Admitting: Family Medicine

## 2016-08-07 ENCOUNTER — Encounter: Payer: Self-pay | Admitting: Gastroenterology

## 2016-08-07 ENCOUNTER — Encounter: Payer: Self-pay | Admitting: Family Medicine

## 2016-08-07 ENCOUNTER — Ambulatory Visit (INDEPENDENT_AMBULATORY_CARE_PROVIDER_SITE_OTHER): Payer: No Typology Code available for payment source | Admitting: Family Medicine

## 2016-08-07 VITALS — BP 152/92 | HR 83 | Temp 98.1°F | Resp 18 | Wt 261.4 lb

## 2016-08-07 DIAGNOSIS — K7460 Unspecified cirrhosis of liver: Secondary | ICD-10-CM | POA: Diagnosis not present

## 2016-08-07 DIAGNOSIS — E669 Obesity, unspecified: Secondary | ICD-10-CM

## 2016-08-07 DIAGNOSIS — R1011 Right upper quadrant pain: Secondary | ICD-10-CM | POA: Diagnosis not present

## 2016-08-07 DIAGNOSIS — E785 Hyperlipidemia, unspecified: Secondary | ICD-10-CM | POA: Diagnosis not present

## 2016-08-07 DIAGNOSIS — M25562 Pain in left knee: Secondary | ICD-10-CM | POA: Diagnosis not present

## 2016-08-07 DIAGNOSIS — K769 Liver disease, unspecified: Secondary | ICD-10-CM | POA: Diagnosis not present

## 2016-08-07 DIAGNOSIS — R945 Abnormal results of liver function studies: Secondary | ICD-10-CM | POA: Diagnosis not present

## 2016-08-07 DIAGNOSIS — E1169 Type 2 diabetes mellitus with other specified complication: Secondary | ICD-10-CM | POA: Diagnosis not present

## 2016-08-07 DIAGNOSIS — E559 Vitamin D deficiency, unspecified: Secondary | ICD-10-CM | POA: Diagnosis not present

## 2016-08-07 DIAGNOSIS — G8929 Other chronic pain: Secondary | ICD-10-CM

## 2016-08-07 DIAGNOSIS — R911 Solitary pulmonary nodule: Secondary | ICD-10-CM | POA: Diagnosis not present

## 2016-08-07 DIAGNOSIS — I10 Essential (primary) hypertension: Secondary | ICD-10-CM | POA: Diagnosis not present

## 2016-08-07 DIAGNOSIS — R7989 Other specified abnormal findings of blood chemistry: Secondary | ICD-10-CM

## 2016-08-07 HISTORY — DX: Solitary pulmonary nodule: R91.1

## 2016-08-07 HISTORY — DX: Abnormal results of liver function studies: R94.5

## 2016-08-07 HISTORY — DX: Other specified abnormal findings of blood chemistry: R79.89

## 2016-08-07 LAB — LIPID PANEL
CHOLESTEROL: 149 mg/dL (ref 0–200)
HDL: 63 mg/dL (ref >39.00)
LDL Cholesterol: 72 mg/dL (ref 0–99)
NonHDL: 85.94
TRIGLYCERIDES: 72 mg/dL (ref 0.0–149.0)
Total CHOL/HDL Ratio: 2
VLDL: 14.4 mg/dL (ref 0.0–40.0)

## 2016-08-07 LAB — COMPREHENSIVE METABOLIC PANEL
ALBUMIN: 4 g/dL (ref 3.5–5.2)
ALT: 28 U/L (ref 0–35)
AST: 34 U/L (ref 0–37)
Alkaline Phosphatase: 101 U/L (ref 39–117)
BILIRUBIN TOTAL: 0.7 mg/dL (ref 0.2–1.2)
BUN: 13 mg/dL (ref 6–23)
CALCIUM: 10 mg/dL (ref 8.4–10.5)
CHLORIDE: 100 meq/L (ref 96–112)
CO2: 24 mEq/L (ref 19–32)
CREATININE: 0.83 mg/dL (ref 0.40–1.20)
GFR: 74.44 mL/min (ref >60.00)
Glucose, Bld: 185 mg/dL — ABNORMAL HIGH (ref 70–99)
Potassium: 4.1 mEq/L (ref 3.5–5.1)
Sodium: 136 mEq/L (ref 135–145)
Total Protein: 6.8 g/dL (ref 6.0–8.3)

## 2016-08-07 LAB — TSH: TSH: 1.42 u[IU]/mL (ref 0.35–4.50)

## 2016-08-07 LAB — CBC
HCT: 41.3 % (ref 36.0–46.0)
HEMOGLOBIN: 13.9 g/dL (ref 12.0–15.0)
MCHC: 33.8 g/dL (ref 30.0–36.0)
MCV: 87.6 fl (ref 78.0–100.0)
PLATELETS: 195 10*3/uL (ref 150.0–400.0)
RBC: 4.71 Mil/uL (ref 3.87–5.11)
RDW: 14.3 % (ref 11.5–15.5)
WBC: 8.1 10*3/uL (ref 4.0–10.5)

## 2016-08-07 LAB — HEMOGLOBIN A1C: Hgb A1c MFr Bld: 7.9 % — ABNORMAL HIGH (ref 4.6–6.5)

## 2016-08-07 LAB — VITAMIN D 25 HYDROXY (VIT D DEFICIENCY, FRACTURES): VITD: 35.03 ng/mL (ref 30.00–100.00)

## 2016-08-07 MED ORDER — TEMAZEPAM 7.5 MG PO CAPS
7.5000 mg | ORAL_CAPSULE | Freq: Every evening | ORAL | 3 refills | Status: DC | PRN
Start: 2016-08-07 — End: 2016-10-30

## 2016-08-07 MED ORDER — LISINOPRIL 10 MG PO TABS: 10.0000 mg | ORAL_TABLET | Freq: Two times a day (BID) | ORAL | 1 refills | Status: DC

## 2016-08-07 NOTE — Assessment & Plan Note (Signed)
hgba1c unacceptable, minimize simple carbs. Increase exercise as tolerated. Continue current meds 

## 2016-08-07 NOTE — Assessment & Plan Note (Signed)
Referred to GI for consultaion, minimize

## 2016-08-07 NOTE — Assessment & Plan Note (Signed)
And RLQ pain intermittent and worsening  In frequency and intensity. Was seen in ED in Feb and CT showed Liver also shows a somewhat mottled appearance with ill-defined areas of hypoattenuation throughout the liver without a discrete mass. Consider further imaging assessment with liver MRI with and without contrast. Referred to GI for surveillance and MRI of liver is ordered

## 2016-08-07 NOTE — Assessment & Plan Note (Signed)
Not well controlled, no changes to meds. Encouraged heart healthy diet such as the DASH diet and exercise as tolerated. Increase lisinopril 10 mg to bid

## 2016-08-07 NOTE — Assessment & Plan Note (Addendum)
encouraged heart healthy diet, avoid trans fats, minimize simple carbs and saturated fats. Increase exercise as tolerated 

## 2016-08-07 NOTE — Patient Instructions (Signed)

## 2016-08-07 NOTE — Assessment & Plan Note (Signed)
5 mm nodule in the right middle lobe. No follow-up needed if patient is low-risk. Non-contrast chest CT can be considered in 12 months if patient is high-risk. This recommendation follows the consensus statement:  Asymptomatic, patient nonsmoker. No further work up for now

## 2016-08-07 NOTE — Assessment & Plan Note (Signed)
Better after steroid injection with ortho

## 2016-08-07 NOTE — Progress Notes (Signed)
Subjective:  I acted as a Education administrator for Dr. Charlett Blake. [place], [place]  Patient ID: Stephanie Cordova, female    DOB: December 21, 1955, 61 y.o.   MRN: [id number]  No chief complaint on file.   HPI  Patient is in today for follow up on HTN, diabetes type 2, hyperlipidemia and other medical conditions. Patient c/o Right side pain. She denies any injury or falls. She also states she is extremely tired more than normal.  No recent febrile illness or acute hospitalizations. Denies CP/palp/SOB/HA/congestion/fevers/GI or GU c/o. Taking meds as prescribed. She reports episodes of right sided abdominal pain are escalating in intensity and frequency. In July of 2017 she was noted to have gallbladder sludge and gallbladder was removed. Subsequent work up has confirmed fatty liver and now cirrhosis. Unfortunately over past 4 months she is having episodes of 3-4 days of increased abdominal pain she describes as sharp and alternately achy at times intense enough o cause nausea and diaphoresis. Lying down makes it worse but no other position changes affect it. No revers or chills. Continues to note increased pressure in right ear s/p Otitis media but no other ear complaints.   Patient Care Team: Mosie Lukes, MD as PCP - General (Family Medicine)   Past Medical History:  Diagnosis Date  . Abnormal liver function tests 08/07/2016  . Arthritis    b/l knees, bone on bone  . Cirrhosis of liver ([place]) 12/21/2012   Confirmed via ultrasound per patient, she reports neg acute hepatitis panel   . Diabetes mellitus type 2 in obese ([place]) 12/21/2012   takes Amaryl daily  . History of bronchitis 03/2015  . History of colon polyps    benign  . Hypertension    takes Metoprolol and Lisinopril daily  . Insomnia   . Pneumonia    hx of > 5 yrs ago  . Pulmonary nodule 08/07/2016  . Sinusitis, acute 01/26/2016  . Vitamin D deficiency 04/24/2016    Past Surgical History:  Procedure Laterality Date  . ABDOMINAL HYSTERECTOMY  1996  .  BREAST BIOPSY    . CHOLECYSTECTOMY N/A 09/06/2015   Procedure: LAPAROSCOPIC CHOLECYSTECTOMY;  Surgeon: Ralene Ok, MD;  Location: [facility];  Service: General;  Laterality: N/A;  . COLONOSCOPY WITH ESOPHAGOGASTRODUODENOSCOPY (EGD)    . KNEE ARTHROSCOPY Bilateral   . TONSILLECTOMY    . TUBAL LIGATION      Family History  Problem Relation Age of Onset  . Hypertension Mother   . Hyperlipidemia Mother   . Leukemia Mother   . Cancer Mother   . Hypertension Father   . Hyperlipidemia Father   . Diabetes Father   . Heart disease Father   . Alzheimer's disease Father   . Multiple sclerosis Son   . Heart disease Maternal Grandfather     Social History   Social History  . Marital status: Widowed    Spouse name: N/A  . Number of children: N/A  . Years of education: N/A   Occupational History  . Customer service    Social History Main Topics  . Smoking status: Never Smoker  . Smokeless tobacco: Never Used  . Alcohol use No  . Drug use: No  . Sexual activity: No     Comment: lives alone, widowed in 2006   Other Topics Concern  . Not on file   Social History Narrative  . No narrative on file    Outpatient Medications Prior to Visit  Medication Sig Dispense Refill  . B-D  ULTRAFINE III SHORT PEN 31G X 8 MM MISC Inject 1 pen as directed 3 (three) times daily.    . Blood Glucose Monitoring Suppl (ONE TOUCH ULTRA SYSTEM KIT) w/Device KIT Use as directed twice daily to check blood sugar.  DX E11.9 1 each 0  . Dulaglutide (TRULICITY) 5.73 UK/0.2RK SOPN Inject 0.75 mg into the skin once a week. 2 mL 5  . fluconazole (DIFLUCAN) 150 MG tablet Take 1 tablet (150 mg total) by mouth once a week. 2 tablet 0  . fluticasone (FLONASE) 50 MCG/ACT nasal spray Place 2 sprays into both nostrils daily. 16 g 1  . furosemide (LASIX) 20 MG tablet TAKE 1 TABLET BY MOUTH EVERY DAY AS NEEDED FOR EDEMA 30 tablet 0  . glimepiride (AMARYL) 4 MG tablet Take 1 tablet (4 mg total) by mouth 2 (two) times  daily. 60 tablet 3  . glucose blood (ONETOUCH VERIO) test strip 1 each by Other route 2 (two) times daily. And lancets 2/day 100 each 12  . HUMALOG KWIKPEN 100 UNIT/ML KiwkPen Inject 45 mg as directed 3 (three) times daily.    . ondansetron (ZOFRAN) 4 MG tablet Take 1 tablet (4 mg total) by mouth every 8 (eight) hours as needed for nausea or vomiting. 6 tablet 0  . ONE TOUCH ULTRA TEST test strip USE AS DIRECTED TWICE A DAY 100 each 2  . ONETOUCH DELICA LANCETS 27C MISC Use as directed twice daily to check blood sugar.  DX E11.9 100 each 5  . tobramycin (TOBREX) 0.3 % ophthalmic solution Place 2 drops into the right eye every 6 (six) hours. 5 mL 0  . tobramycin (TOBREX) 0.3 % ophthalmic solution Place 2 drops into the right eye every 6 (six) hours. 5 mL 0  . traMADol (ULTRAM) 50 MG tablet Take 1 tablet (50 mg total) by mouth every 8 (eight) hours as needed. 30 tablet 0  . Vitamin D, Ergocalciferol, (DRISDOL) 50000 units CAPS capsule Take 1 capsule (50,000 Units total) by mouth every 7 (seven) days. 12 capsule 0  . lisinopril (PRINIVIL,ZESTRIL) 10 MG tablet Take 1 tablet (10 mg total) by mouth daily. 30 tablet 3  . temazepam (RESTORIL) 7.5 MG capsule Take 1 capsule (7.5 mg total) by mouth at bedtime as needed for sleep. 30 capsule 0  . cephALEXin (KEFLEX) 500 MG capsule Take 1 capsule (500 mg total) by mouth 2 (two) times daily. 20 capsule 0   No facility-administered medications prior to visit.     Allergies  Allergen Reactions  . Losartan Palpitations  . Lexapro [Escitalopram] Other (See Comments)    swelling  . Sulfa Antibiotics Hives and Rash    "Burning" rash    Review of Systems  Constitutional: Positive for malaise/fatigue. Negative for fever.  HENT: Positive for ear pain. Negative for congestion.   Eyes: Negative for blurred vision.  Respiratory: Negative for cough and shortness of breath.   Cardiovascular: Negative for chest pain, palpitations and leg swelling.    Gastrointestinal: Positive for abdominal pain and nausea. Negative for blood in stool, constipation, diarrhea, heartburn, melena and vomiting.  Musculoskeletal: Positive for joint pain and myalgias. Negative for back pain.  Skin: Negative for rash.  Neurological: Negative for loss of consciousness and headaches.       Objective:    Physical Exam  Constitutional: She is oriented to person, place, and time. She appears well-developed and well-nourished. No distress.  HENT:  Head: Normocephalic and atraumatic.  Eyes: Conjunctivae are normal.  Neck: Normal  range of motion. No thyromegaly present.  Cardiovascular: Normal rate and regular rhythm.   Pulmonary/Chest: Effort normal and breath sounds normal. She has no wheezes.  Abdominal: Soft. Bowel sounds are normal. She exhibits no distension and no mass. There is no tenderness. There is no rebound and no guarding.  Musculoskeletal: Normal range of motion. She exhibits no edema or deformity.  Lymphadenopathy:    She has no cervical adenopathy.  Neurological: She is alert and oriented to person, place, and time.  Skin: Skin is warm and dry. She is not diaphoretic.  Psychiatric: She has a normal mood and affect.    BP (!) 152/92 (BP Location: Left Wrist, Patient Position: Sitting, Cuff Size: Normal)   Pulse 83   Temp 98.1 F (36.7 C) (Oral)   Resp 18   Wt 261 lb 6.4 oz (118.6 kg)   LMP 01/29/1994   SpO2 98%   BMI 49.39 kg/m  Wt Readings from Last 3 Encounters:  08/07/16 261 lb 6.4 oz (118.6 kg)  07/03/16 262 lb (118.8 kg)  05/09/16 258 lb 12.8 oz (117.4 kg)   BP Readings from Last 3 Encounters:  08/07/16 (!) 152/92  07/03/16 140/88  05/09/16 (!) 165/95     Immunization History  Administered Date(s) Administered  . Pneumococcal-Unspecified 12/30/2011  . Td 01/29/2009    Health Maintenance  Topic Date Due  . Hepatitis C Screening  April 09, 1955  . HIV Screening  04/01/1971  . INFLUENZA VACCINE  10/25/2016 (Originally  08/29/2016)  . PAP SMEAR  04/24/2017 (Originally 01/29/2013)  . HEMOGLOBIN A1C  10/25/2016  . PNEUMOCOCCAL POLYSACCHARIDE VACCINE (2) 12/29/2016  . OPHTHALMOLOGY EXAM  12/29/2016  . FOOT EXAM  04/24/2017  . MAMMOGRAM  06/06/2017  . TETANUS/TDAP  01/30/2019  . COLONOSCOPY  11/27/2022    Lab Results  Component Value Date   WBC 6.6 04/24/2016   HGB 14.3 04/24/2016   HCT 42.9 04/24/2016   PLT 135.0 (L) 04/24/2016   GLUCOSE 310 (H) 04/24/2016   CHOL 145 04/24/2016   TRIG 108.0 04/24/2016   HDL 60.90 04/24/2016   LDLCALC 63 04/24/2016   ALT 40 (H) 04/24/2016   AST 47 (H) 04/24/2016   NA 137 04/24/2016   K 4.1 04/24/2016   CL 102 04/24/2016   CREATININE 0.79 04/24/2016   BUN 12 04/24/2016   CO2 28 04/24/2016   TSH 1.82 04/24/2016   HGBA1C 9.0 (H) 04/24/2016   MICROALBUR 2.6 (H) 12/09/2015    Lab Results  Component Value Date   TSH 1.82 04/24/2016   Lab Results  Component Value Date   WBC 6.6 04/24/2016   HGB 14.3 04/24/2016   HCT 42.9 04/24/2016   MCV 88.9 04/24/2016   PLT 135.0 (L) 04/24/2016   Lab Results  Component Value Date   NA 137 04/24/2016   K 4.1 04/24/2016   CO2 28 04/24/2016   GLUCOSE 310 (H) 04/24/2016   BUN 12 04/24/2016   CREATININE 0.79 04/24/2016   BILITOT 0.8 04/24/2016   ALKPHOS 80 04/24/2016   AST 47 (H) 04/24/2016   ALT 40 (H) 04/24/2016   PROT 6.2 04/24/2016   ALBUMIN 3.8 04/24/2016   CALCIUM 9.0 04/24/2016   ANIONGAP 9 03/05/2016   GFR 78.88 04/24/2016   Lab Results  Component Value Date   CHOL 145 04/24/2016   Lab Results  Component Value Date   HDL 60.90 04/24/2016   Lab Results  Component Value Date   LDLCALC 63 04/24/2016   Lab Results  Component Value Date  TRIG 108.0 04/24/2016   Lab Results  Component Value Date   CHOLHDL 2 04/24/2016   Lab Results  Component Value Date   HGBA1C 9.0 (H) 04/24/2016         Assessment & Plan:   Problem List Items Addressed This Visit    HTN (hypertension) - Primary     Not well controlled, no changes to meds. Encouraged heart healthy diet such as the DASH diet and exercise as tolerated. Increase lisinopril 10 mg to bid      Relevant Medications   lisinopril (PRINIVIL,ZESTRIL) 10 MG tablet   Other Relevant Orders   CBC   Comprehensive metabolic panel   TSH   Diabetes mellitus type 2 in obese ([facility])    hgba1c unacceptable, minimize simple carbs. Increase exercise as tolerated. Continue current meds      Relevant Medications   lisinopril (PRINIVIL,ZESTRIL) 10 MG tablet   Other Relevant Orders   Hemoglobin A1c   Cirrhosis of liver ([place])    Referred to GI for consultaion, minimize      Relevant Orders   MR LIVER W WO CONTRAST   Ambulatory referral to Gastroenterology   RUQ pain    And RLQ pain intermittent and worsening  In frequency and intensity. Was seen in ED in Feb and CT showed Liver also shows a somewhat mottled appearance with ill-defined areas of hypoattenuation throughout the liver without a discrete mass. Consider further imaging assessment with liver MRI with and without contrast. Referred to GI for surveillance and MRI of liver is ordered      Relevant Orders   MR LIVER W WO CONTRAST   Ambulatory referral to Gastroenterology   Left knee pain    Better after steroid injection with ortho      Vitamin D deficiency   Relevant Orders   Vitamin D (25 hydroxy)   Hyperlipidemia     encouraged heart healthy diet, avoid trans fats, minimize simple carbs and saturated fats. Increase exercise as tolerated      Relevant Medications   lisinopril (PRINIVIL,ZESTRIL) 10 MG tablet   Other Relevant Orders   Lipid panel   Abnormal liver function tests   Relevant Orders   MR LIVER W WO CONTRAST   Ambulatory referral to Gastroenterology   Pulmonary nodule    5 mm nodule in the right middle lobe. No follow-up needed if patient is low-risk. Non-contrast chest CT can be considered in 12 months if patient is high-risk. This recommendation  follows the consensus statement:  Asymptomatic, patient nonsmoker. No further work up for now         Other Visit Diagnoses    Liver lesion       Relevant Orders   MR LIVER W [place]   Ambulatory referral to Gastroenterology      I have discontinued Ms. Stegall's cephALEXin. I have also changed her lisinopril. Additionally, I am having her maintain her Sims KIT, ONE TOUCH ULTRA TEST, glucose blood, furosemide, ondansetron, ONETOUCH DELICA LANCETS 81X, HUMALOG KWIKPEN, B-D ULTRAFINE III SHORT PEN, traMADol, glimepiride, tobramycin, Dulaglutide, fluconazole, tobramycin, fluticasone, Vitamin D (Ergocalciferol), and temazepam.  Meds ordered this encounter  Medications  . temazepam (RESTORIL) 7.5 MG capsule    Sig: Take 1 capsule (7.5 mg total) by mouth at bedtime as needed for sleep.    Dispense:  30 capsule    Refill:  3  . lisinopril (PRINIVIL,ZESTRIL) 10 MG tablet    Sig: Take 1 tablet (10 mg total) by  mouth 2 (two) times daily.    Dispense:  180 tablet    Refill:  1    CMA served as scribe during this visit. History, Physical and Plan performed by medical provider. Documentation and orders reviewed and attested to.  Penni Homans, MD

## 2016-08-15 ENCOUNTER — Ambulatory Visit (HOSPITAL_COMMUNITY)
Admission: RE | Admit: 2016-08-15 | Discharge: 2016-08-15 | Disposition: A | Discharge status: Home / Self Care | Payer: No Typology Code available for payment source | Source: Ambulatory Visit | Attending: Family Medicine | Admitting: Family Medicine

## 2016-08-15 ENCOUNTER — Encounter (HOSPITAL_COMMUNITY): Payer: Self-pay

## 2016-08-15 DIAGNOSIS — K7460 Unspecified cirrhosis of liver: Secondary | ICD-10-CM

## 2016-08-15 DIAGNOSIS — R7989 Other specified abnormal findings of blood chemistry: Secondary | ICD-10-CM

## 2016-08-15 DIAGNOSIS — K769 Liver disease, unspecified: Secondary | ICD-10-CM

## 2016-08-15 DIAGNOSIS — R945 Abnormal results of liver function studies: Secondary | ICD-10-CM

## 2016-08-15 DIAGNOSIS — R1011 Right upper quadrant pain: Secondary | ICD-10-CM

## 2016-08-27 ENCOUNTER — Ambulatory Visit: Payer: No Typology Code available for payment source | Admitting: Family Medicine

## 2016-08-28 ENCOUNTER — Telehealth: Payer: Self-pay | Admitting: Family Medicine

## 2016-08-28 ENCOUNTER — Ambulatory Visit: Payer: No Typology Code available for payment source | Admitting: Family Medicine

## 2016-08-28 NOTE — Telephone Encounter (Signed)
Pt think that she may have a yeast infection. She is experiencing some vaginal redness and itching. She would like to know if she could have something called in to the pharmacy?     [facility] [place]

## 2016-08-29 MED ORDER — FLUCONAZOLE 150 MG PO TABS: 150.0000 mg | ORAL_TABLET | ORAL | 0 refills | Status: DC

## 2016-08-29 NOTE — Telephone Encounter (Signed)
Would you like for patient to come in for can we send her in anything?

## 2016-08-29 NOTE — Telephone Encounter (Signed)
OK to send in Diflucan 150 mg tabs, 1 tab po q week x 2 weeks. If no improvement then needs to come in. If she has a statin in her list, she needs to hold it the day she takes the Diflucan

## 2016-08-29 NOTE — Telephone Encounter (Signed)
Patient notified

## 2016-09-19 ENCOUNTER — Other Ambulatory Visit: Payer: Self-pay | Admitting: Medical

## 2016-09-27 ENCOUNTER — Ambulatory Visit: Payer: No Typology Code available for payment source | Admitting: Gastroenterology

## 2016-10-02 ENCOUNTER — Telehealth: Payer: Self-pay | Admitting: Family Medicine

## 2016-10-02 NOTE — Telephone Encounter (Signed)
Pt feels that she may have a Yeast infection because she is having some itching down below. Pt says that she was just taking an antibiotic. Pt would like to know if provider could just call her something in to pharmacy?    CB: [phone]: [phone]    Ph[id number] - [place], [place] - [address]

## 2016-10-02 NOTE — Telephone Encounter (Signed)
Please advise    PC 

## 2016-10-03 ENCOUNTER — Telehealth: Payer: Self-pay | Admitting: Family Medicine

## 2016-10-03 NOTE — Telephone Encounter (Signed)
[facility] - Client TELEPHONE ADVICE RECORD [facility] Patient Name: Stephanie MochaMARY Pistilli DOB: 08/30/1955 Initial Comment Caller states she was taking medication for an finger infection and she now thinks she has a yeast infection. She states her vaginal area is itchy and painful. She needs to be triaged before making the apt. Nurse Assessment Nurse: Lane HackerHarley, RN, Elvin SoWindy Date/Time (Eastern Time): 10/03/2016 9:28:26 AM Confirm and document reason for call. If symptomatic, describe symptoms. ---Caller states vagina is itchy and painful - rates pain now at 6/10. No discharge. S/S started 3 days ago. -- She was taking antibiotic for an finger infection and she now thinks she has a yeast infection. Requesting Diflucan. [facility] [place]. Allergy: Sulfa. Does the patient have any new or worsening symptoms? ---Yes Will a triage be completed? ---Yes Related visit to physician within the last 2 weeks? ---No Does the PT have any chronic conditions? (i.e. diabetes, asthma, etc.) ---Yes List chronic conditions. ---Diabetic, HTN Is this a behavioral health or substance abuse call? ---No Guidelines Guideline Title Affirmed Question Affirmed Notes Vaginal Symptoms MODERATE-SEVERE itching (i.e., interferes with school, work, or sleep) Final Disposition User See Physician within 24 Hours [place], Charity fundraiserN, [employer] Chemical[employer] Comments Caller states that she has to work, and just would like the Diflucan called in w/o being seen.  She has to work til 8 pm today.  Please call. She called yesterday morning, and was told the Diflucan would be called in, but nothing at pharmacy. She is willing to come in if the medicine doesn't work. Referrals GO TO FACILITY REFUSED Disagree/Comply: Disagree Disagree/Comply Reason: Disagree with instructions

## 2016-10-04 NOTE — Telephone Encounter (Addendum)
Relation to pt: self  Call back [phone] [facility]acy: [id number] [place], [place] - [address] [phone] (Phone) [phone] (Fax)     Reason for call:  Patient declined appointment stating the yeast infection is from the antibiotic and would like Rx sent today, patient states vaginal itchiness has not improved, please advise patient states this is her 3x calling

## 2016-10-05 ENCOUNTER — Ambulatory Visit: Payer: No Typology Code available for payment source | Admitting: Family Medicine

## 2016-10-05 ENCOUNTER — Telehealth: Payer: Self-pay | Admitting: Family Medicine

## 2016-10-05 DIAGNOSIS — Z0289 Encounter for other administrative examinations: Secondary | ICD-10-CM

## 2016-10-05 MED ORDER — FLUCONAZOLE 150 MG PO TABS: 150.0000 mg | ORAL_TABLET | ORAL | 0 refills | Status: DC

## 2016-10-05 NOTE — Telephone Encounter (Signed)
SB-I spoke with this patient who was seen in the ED on 8.18.18 and given Abx for Tx of infected finger/has been experiencing classic yeast inf Sx/cannot afford to come in right now and is begging for Diflucan 150mg  Rx/She states that she is miserable/plz advise/thx dmf

## 2016-10-05 NOTE — Addendum Note (Signed)
Addended by: Lerry LinerFREDERICK, Jasleen Riepe. M on: 10/05/2016 10:13 AM   Modules accepted: Orders

## 2016-10-05 NOTE — Telephone Encounter (Signed)
Per discussion with SB ok to fax in Diflucan 150mg  1qwk for 2 weeks #3/[facility]/pt aware/thx dmf

## 2016-10-05 NOTE — Telephone Encounter (Signed)
Patient is upset no one responded back to her concerns mentioned below,patient scheduled with Dr. Beverely Lowabori today at 4pm, patient would like a call back from the nurse regarding concerns mentioned below

## 2016-10-05 NOTE — Telephone Encounter (Signed)
Noted. See telephone note 10/03/16 and 10/05/16.

## 2016-10-05 NOTE — Telephone Encounter (Signed)
Pt says that she no longer need apt. Provider called in a medication to the pharmacy for her.

## 2016-10-09 ENCOUNTER — Telehealth: Payer: Self-pay | Admitting: Family Medicine

## 2016-10-09 NOTE — Telephone Encounter (Signed)
No need for referral for Gyn she just needs to call and make appointment.   Pc

## 2016-10-09 NOTE — Telephone Encounter (Signed)
Patient called requesting referral to a GYN doctor

## 2016-10-10 NOTE — Telephone Encounter (Signed)
Pt has been scheduled.  °

## 2016-10-30 ENCOUNTER — Encounter: Payer: Self-pay | Admitting: Family Medicine

## 2016-10-30 ENCOUNTER — Ambulatory Visit (INDEPENDENT_AMBULATORY_CARE_PROVIDER_SITE_OTHER): Payer: No Typology Code available for payment source | Admitting: Family Medicine

## 2016-10-30 VITALS — BP 145/94

## 2016-10-30 DIAGNOSIS — N761 Subacute and chronic vaginitis: Secondary | ICD-10-CM | POA: Diagnosis not present

## 2016-10-30 DIAGNOSIS — I10 Essential (primary) hypertension: Secondary | ICD-10-CM | POA: Diagnosis not present

## 2016-10-30 DIAGNOSIS — G8929 Other chronic pain: Secondary | ICD-10-CM | POA: Diagnosis not present

## 2016-10-30 DIAGNOSIS — N760 Acute vaginitis: Secondary | ICD-10-CM

## 2016-10-30 DIAGNOSIS — E785 Hyperlipidemia, unspecified: Secondary | ICD-10-CM

## 2016-10-30 DIAGNOSIS — M25562 Pain in left knee: Secondary | ICD-10-CM | POA: Diagnosis not present

## 2016-10-30 DIAGNOSIS — Z0000 Encounter for general adult medical examination without abnormal findings: Secondary | ICD-10-CM

## 2016-10-30 DIAGNOSIS — B001 Herpesviral vesicular dermatitis: Secondary | ICD-10-CM

## 2016-10-30 DIAGNOSIS — Z0001 Encounter for general adult medical examination with abnormal findings: Secondary | ICD-10-CM

## 2016-10-30 DIAGNOSIS — E559 Vitamin D deficiency, unspecified: Secondary | ICD-10-CM | POA: Diagnosis not present

## 2016-10-30 DIAGNOSIS — R1011 Right upper quadrant pain: Secondary | ICD-10-CM

## 2016-10-30 DIAGNOSIS — E119 Type 2 diabetes mellitus without complications: Secondary | ICD-10-CM

## 2016-10-30 DIAGNOSIS — E669 Obesity, unspecified: Secondary | ICD-10-CM

## 2016-10-30 DIAGNOSIS — E1169 Type 2 diabetes mellitus with other specified complication: Secondary | ICD-10-CM

## 2016-10-30 HISTORY — DX: Acute vaginitis: N76.0

## 2016-10-30 MED ORDER — LISINOPRIL 20 MG PO TABS: 20.0000 mg | ORAL_TABLET | Freq: Every day | ORAL | 3 refills | Status: DC

## 2016-10-30 MED ORDER — TEMAZEPAM 7.5 MG PO CAPS: 7.5000 mg | ORAL_CAPSULE | Freq: Every evening | ORAL | 5 refills | Status: DC | PRN

## 2016-10-30 MED ORDER — ACYCLOVIR 400 MG PO TABS: 400.0000 mg | ORAL_TABLET | Freq: Every day | ORAL | 0 refills | Status: DC

## 2016-10-30 NOTE — Assessment & Plan Note (Signed)
Is seeing Dr Charlann Boxer and was pulled out of work which helped some but is now using a walker which is helping her bursitis

## 2016-10-30 NOTE — Patient Instructions (Addendum)
New shingels shot is called Shingrix 2 shots over 6 months, call insurance to confirm payment Prevnar is the new pneumonia shot  Flu shot Call here if you would like to come back and see the nurses for shots  Preventive Care 40-64 Years, Female Preventive care refers to lifestyle choices and visits with your health care provider that can promote health and wellness. What does preventive care include?  A yearly physical exam. This is also called an annual well check.  Dental exams once or twice a year.  Routine eye exams. Ask your health care provider how often you should have your eyes checked.  Personal lifestyle choices, including: ? Daily care of your teeth and gums. ? Regular physical activity. ? Eating a healthy diet. ? Avoiding tobacco and drug use. ? Limiting alcohol use. ? Practicing safe sex. ? Taking low-dose aspirin daily starting at age 1. ? Taking vitamin and mineral supplements as recommended by your health care provider. What happens during an annual well check? The services and screenings done by your health care provider during your annual well check will depend on your age, overall health, lifestyle risk factors, and family history of disease. Counseling Your health care provider may ask you questions about your:  Alcohol use.  Tobacco use.  Drug use.  Emotional well-being.  Home and relationship well-being.  Sexual activity.  Eating habits.  Work and work Statistician.  Method of birth control.  Menstrual cycle.  Pregnancy history.  Screening You may have the following tests or measurements:  Height, weight, and BMI.  Blood pressure.  Lipid and cholesterol levels. These may be checked every 5 years, or more frequently if you are over 60 years old.  Skin check.  Lung cancer screening. You may have this screening every year starting at age 60 if you have a 30-pack-year history of smoking and currently smoke or have quit within the past 15  years.  Fecal occult blood test (FOBT) of the stool. You may have this test every year starting at age 60.  Flexible sigmoidoscopy or colonoscopy. You may have a sigmoidoscopy every 5 years or a colonoscopy every 10 years starting at age 60.  Hepatitis C blood test.  Hepatitis B blood test.  Sexually transmitted disease (STD) testing.  Diabetes screening. This is done by checking your blood sugar (glucose) after you have not eaten for a while (fasting). You may have this done every 1-3 years.  Mammogram. This may be done every 1-2 years. Talk to your health care provider about when you should start having regular mammograms. This may depend on whether you have a family history of breast cancer.  BRCA-related cancer screening. This may be done if you have a family history of breast, ovarian, tubal, or peritoneal cancers.  Pelvic exam and Pap test. This may be done every 3 years starting at age 60. Starting at age 26, this may be done every 5 years if you have a Pap test in combination with an HPV test.  Bone density scan. This is done to screen for osteoporosis. You may have this scan if you are at high risk for osteoporosis.  Discuss your test results, treatment options, and if necessary, the need for more tests with your health care provider. Vaccines Your health care provider may recommend certain vaccines, such as:  Influenza vaccine. This is recommended every year.  Tetanus, diphtheria, and acellular pertussis (Tdap, Td) vaccine. You may need a Td booster every 10 years.  Varicella vaccine.  You may need this if you have not been vaccinated.  Zoster vaccine. You may need this after age 60.  Measles, mumps, and rubella (MMR) vaccine. You may need at least one dose of MMR if you were born in 1957 or later. You may also need a second dose.  Pneumococcal 13-valent conjugate (PCV13) vaccine. You may need this if you have certain conditions and were not previously  vaccinated.  Pneumococcal polysaccharide (PPSV23) vaccine. You may need one or two doses if you smoke cigarettes or if you have certain conditions.  Meningococcal vaccine. You may need this if you have certain conditions.  Hepatitis A vaccine. You may need this if you have certain conditions or if you travel or work in places where you may be exposed to hepatitis A.  Hepatitis B vaccine. You may need this if you have certain conditions or if you travel or work in places where you may be exposed to hepatitis B.  Haemophilus influenzae type b (Hib) vaccine. You may need this if you have certain conditions.  Talk to your health care provider about which screenings and vaccines you need and how often you need them. This information is not intended to replace advice given to you by your health care provider. Make sure you discuss any questions you have with your health care provider. Document Released: 02/11/2015 Document Revised: 10/05/2015 Document Reviewed: 11/16/2014 Elsevier Interactive Patient Education  2017 [employer].

## 2016-10-30 NOTE — Assessment & Plan Note (Signed)
Tolerating the once weekly dose and is covered well by insurance will not change dosing at this time

## 2016-10-30 NOTE — Assessment & Plan Note (Signed)
Consider atrophic vaginitis vs yeast. Declines steroid cream. Can try OTC, clean try Witch hazel prn

## 2016-10-30 NOTE — Assessment & Plan Note (Signed)
Is doing better since stopping Trulicity

## 2016-10-30 NOTE — Progress Notes (Signed)
Subjective:  CMA served as Education administrator for visit.   Patient ID: Stephanie Cordova, female    DOB: Mar 24, 1955, 61 y.o.   MRN: [id number]  Chief Complaint  Patient presents with  . Annual Exam    Pt states that she needs to see OB/GYN. Has reoccurent Yeast infections and hasn't been in over 15 years. Has a fever blister on her lip and wants needs to know how to get rid of it by Sunday. Pt states she hadn't been taking her diabetes supplies.   . Hypertension  . Medication Refill  . Knee Pain    Left knee is swelling and bruising    HPI  Patient is in today for annual preventative exam and follow up on chronic medical concerns. She is having trouble with cold sores on her lip for the past couple of days and it is painful. She has a wedding to go to soon and is hoping to help it. No recent febrile illness or hospitalization. She has been under a great deal of stress. She notes she was using Trulicity but she had abdominal pain since stopping it she has not had any further pain. She deneis polyuria or polydipsia. Is doing well with activities of daily living. Is maintaining a heart healthy diet and stays active. Sugars have improved with recent changes. She notes her blood sugars were in the 220s but now they are in the 140s. Denies CP/palp/SOB/HA/congestion/fevers/GI or GU c/o. Taking meds as prescribed  Patient Care Team: Mosie Lukes, MD as PCP - General (Family Medicine)   Past Medical History:  Diagnosis Date  . Abnormal liver function tests 08/07/2016  . Arthritis    b/l knees, bone on bone  . Cirrhosis of liver ([facility]) 12/21/2012   Confirmed via ultrasound per patient, she reports neg acute hepatitis panel   . Cold sore 11/04/2016  . Diabetes mellitus type 2 in obese ([facility]) 12/21/2012   takes Amaryl daily  . History of bronchitis 03/2015  . History of colon polyps    benign  . Hypertension    takes Metoprolol and Lisinopril daily  . Insomnia   . Pneumonia    hx of > 5 yrs ago  . Pulmonary  nodule 08/07/2016  . Sinusitis, acute 01/26/2016  . Vaginitis 10/30/2016  . Vitamin D deficiency 04/24/2016    Past Surgical History:  Procedure Laterality Date  . ABDOMINAL HYSTERECTOMY  1996  . BREAST BIOPSY    . CHOLECYSTECTOMY N/A 09/06/2015   Procedure: LAPAROSCOPIC CHOLECYSTECTOMY;  Surgeon: Ralene Ok, MD;  Location: [facility];  Service: General;  Laterality: N/A;  . COLONOSCOPY WITH ESOPHAGOGASTRODUODENOSCOPY (EGD)    . KNEE ARTHROSCOPY Bilateral   . TONSILLECTOMY    . TUBAL LIGATION      Family History  Problem Relation Age of Onset  . Hypertension Mother   . Hyperlipidemia Mother   . Leukemia Mother   . Cancer Mother   . Hypertension Father   . Hyperlipidemia Father   . Diabetes Father   . Heart disease Father   . Alzheimer's disease Father   . Multiple sclerosis Son   . Heart disease Maternal Grandfather     Social History   Social History  . Marital status: Widowed    Spouse name: N/A  . Number of children: N/A  . Years of education: N/A   Occupational History  . Customer service    Social History Main Topics  . Smoking status: Never Smoker  . Smokeless tobacco: Never  Used  . Alcohol use No  . Drug use: No  . Sexual activity: No     Comment: lives alone, widowed in 2006   Other Topics Concern  . Not on file   Social History Narrative  . No narrative on file    Outpatient Medications Prior to Visit  Medication Sig Dispense Refill  . B-D ULTRAFINE III SHORT PEN 31G X 8 MM MISC Inject 1 pen as directed 3 (three) times daily.    . Blood Glucose Monitoring Suppl (ONE TOUCH ULTRA SYSTEM KIT) w/Device KIT Use as directed twice daily to check blood sugar.  DX E11.9 1 each 0  . furosemide (LASIX) 20 MG tablet TAKE 1 TABLET BY MOUTH EVERY DAY AS NEEDED FOR EDEMA 30 tablet 0  . glimepiride (AMARYL) 4 MG tablet Take 1 tablet (4 mg total) by mouth 2 (two) times daily. 60 tablet 3  . glucose blood (ONETOUCH VERIO) test strip 1 each by Other route 2 (two)  times daily. And lancets 2/day 100 each 12  . HUMALOG KWIKPEN 100 UNIT/ML KiwkPen Inject 45 mg as directed 3 (three) times daily.    . ONE TOUCH ULTRA TEST test strip USE AS DIRECTED TWICE A DAY 100 each 2  . ONETOUCH DELICA LANCETS 67E MISC Use as directed twice daily to check blood sugar.  DX E11.9 100 each 5  . tobramycin (TOBREX) 0.3 % ophthalmic solution PLACE 2 DROPS INTO THE RIGHT EYE EVERY 6 (SIX) HOURS. 5 mL 0  . Vitamin D, Ergocalciferol, (DRISDOL) 50000 units CAPS capsule Take 1 capsule (50,000 Units total) by mouth every 7 (seven) days. 12 capsule 0  . fluconazole (DIFLUCAN) 150 MG tablet Take 1 tablet (150 mg total) by mouth once a week. For 2 weeks 2 tablet 0  . fluticasone (FLONASE) 50 MCG/ACT nasal spray Place 2 sprays into both nostrils daily. 16 g 1  . lisinopril (PRINIVIL,ZESTRIL) 10 MG tablet Take 1 tablet (10 mg total) by mouth 2 (two) times daily. 180 tablet 1  . ondansetron (ZOFRAN) 4 MG tablet Take 1 tablet (4 mg total) by mouth every 8 (eight) hours as needed for nausea or vomiting. 6 tablet 0  . tobramycin (TOBREX) 0.3 % ophthalmic solution Place 2 drops into the right eye every 6 (six) hours. 5 mL 0  . traMADol (ULTRAM) 50 MG tablet Take 1 tablet (50 mg total) by mouth every 8 (eight) hours as needed. 30 tablet 0  . Dulaglutide (TRULICITY) 7.20 NO/7.0JG SOPN Inject 0.75 mg into the skin once a week. (Patient not taking: Reported on 10/30/2016) 2 mL 5  . temazepam (RESTORIL) 7.5 MG capsule Take 1 capsule (7.5 mg total) by mouth at bedtime as needed for sleep. (Patient not taking: Reported on 10/30/2016) 30 capsule 3   No facility-administered medications prior to visit.     Allergies  Allergen Reactions  . Losartan Palpitations  . Lexapro [Escitalopram] Other (See Comments)    swelling  . Sulfa Antibiotics Hives and Rash    "Burning" rash    Review of Systems  Constitutional: Negative for chills, fever and malaise/fatigue.  HENT: Negative for congestion and  hearing loss.   Eyes: Negative for discharge.  Respiratory: Negative for cough, sputum production and shortness of breath.   Cardiovascular: Negative for chest pain, palpitations and leg swelling.  Gastrointestinal: Negative for abdominal pain, blood in stool, constipation, diarrhea, heartburn, nausea and vomiting.  Genitourinary: Negative for dysuria, frequency, hematuria and urgency.  Musculoskeletal: Positive for joint pain. Negative  for back pain, falls and myalgias.  Skin: Negative for rash.  Neurological: Negative for dizziness, sensory change, loss of consciousness, weakness and headaches.  Endo/Heme/Allergies: Negative for environmental allergies. Does not bruise/bleed easily.  Psychiatric/Behavioral: Negative for depression and suicidal ideas. The patient is not nervous/anxious and does not have insomnia.        Objective:    Physical Exam  Constitutional: She is oriented to person, place, and time. She appears well-developed and well-nourished. No distress.  HENT:  Head: Normocephalic and atraumatic.  Eyes: Conjunctivae are normal.  Neck: Neck supple. No thyromegaly present.  Cardiovascular: Normal rate, regular rhythm and normal heart sounds.   No murmur heard. Pulmonary/Chest: Effort normal and breath sounds normal. No respiratory distress.  Abdominal: Soft. Bowel sounds are normal. She exhibits no distension and no mass. There is no tenderness.  Musculoskeletal: She exhibits no edema.  Lymphadenopathy:    She has no cervical adenopathy.  Neurological: She is alert and oriented to person, place, and time.  Skin: Skin is warm and dry.  Blister on upper lip  Psychiatric: She has a normal mood and affect. Her behavior is normal.    BP (!) 145/94   LMP 01/29/1994  Wt Readings from Last 3 Encounters:  08/07/16 261 lb 6.4 oz (118.6 kg)  07/03/16 262 lb (118.8 kg)  05/09/16 258 lb 12.8 oz (117.4 kg)   BP Readings from Last 3 Encounters:  10/30/16 (!) 145/94  08/07/16  (!) 152/92  07/03/16 140/88     Immunization History  Administered Date(s) Administered  . Pneumococcal-Unspecified 12/30/2011  . Td 01/29/2009    Health Maintenance  Topic Date Due  . Hepatitis C Screening  03[birth year]  . HIV Screening  04/01/1971  . INFLUENZA VACCINE  08/29/2016  . PAP SMEAR  04/24/2017 (Originally 01/29/2013)  . PNEUMOCOCCAL POLYSACCHARIDE VACCINE (2) 12/29/2016  . OPHTHALMOLOGY EXAM  12/29/2016  . HEMOGLOBIN A1C  02/07/2017  . FOOT EXAM  04/24/2017  . MAMMOGRAM  06/06/2017  . TETANUS/TDAP  01/30/2019  . COLONOSCOPY  11/27/2022    Lab Results  Component Value Date   WBC 8.1 08/07/2016   HGB 13.9 08/07/2016   HCT 41.3 08/07/2016   PLT 195.0 08/07/2016   GLUCOSE 185 (H) 08/07/2016   CHOL 149 08/07/2016   TRIG 72.0 08/07/2016   HDL 63.00 08/07/2016   LDLCALC 72 08/07/2016   ALT 28 08/07/2016   AST 34 08/07/2016   NA 136 08/07/2016   K 4.1 08/07/2016   CL 100 08/07/2016   CREATININE 0.83 08/07/2016   BUN 13 08/07/2016   CO2 24 08/07/2016   TSH 1.42 08/07/2016   HGBA1C 7.9 (H) 08/07/2016   MICROALBUR 2.6 (H) 12/09/2015    Lab Results  Component Value Date   TSH 1.42 08/07/2016   Lab Results  Component Value Date   WBC 8.1 08/07/2016   HGB 13.9 08/07/2016   HCT 41.3 08/07/2016   MCV 87.6 08/07/2016   PLT 195.0 08/07/2016   Lab Results  Component Value Date   NA 136 08/07/2016   K 4.1 08/07/2016   CO2 24 08/07/2016   GLUCOSE 185 (H) 08/07/2016   BUN 13 08/07/2016   CREATININE 0.83 08/07/2016   BILITOT 0.7 08/07/2016   ALKPHOS 101 08/07/2016   AST 34 08/07/2016   ALT 28 08/07/2016   PROT 6.8 08/07/2016   ALBUMIN 4.0 08/07/2016   CALCIUM 10.0 08/07/2016   ANIONGAP 9 03/05/2016   GFR 74.44 08/07/2016   Lab Results  Component Value  Date   CHOL 149 08/07/2016   Lab Results  Component Value Date   HDL 63.00 08/07/2016   Lab Results  Component Value Date   LDLCALC 72 08/07/2016   Lab Results  Component Value Date   TRIG  72.0 08/07/2016   Lab Results  Component Value Date   CHOLHDL 2 08/07/2016   Lab Results  Component Value Date   HGBA1C 7.9 (H) 08/07/2016         Assessment & Plan:   Problem List Items Addressed This Visit    Preventative health care - Primary    Patient encouraged to maintain heart healthy diet, regular exercise, adequate sleep. Consider daily probiotics. Take medications as prescribed. Reviewed labs.      HTN (hypertension)    Lisinopril 20 mg daily      Relevant Medications   lisinopril (PRINIVIL,ZESTRIL) 20 MG tablet   Diabetes mellitus type 2 in obese ([facility])    hgba1c acceptable, minimize simple carbs. Increase exercise as tolerated. Continue current meds      Relevant Medications   lisinopril (PRINIVIL,ZESTRIL) 20 MG tablet   RUQ pain    Is doing better since stopping Trulicity      Left knee pain    Is seeing Dr Alvan Dame and was pulled out of work which helped some but is now using a walker which is helping her bursitis      Vitamin D deficiency    Tolerating the once weekly dose and is covered well by insurance will not change dosing at this time      Hyperlipidemia    Encouraged heart healthy diet, increase exercise, avoid trans fats, consider a krill oil cap daily      Relevant Medications   lisinopril (PRINIVIL,ZESTRIL) 20 MG tablet   Vaginitis    Consider atrophic vaginitis vs yeast. Declines steroid cream. Can try OTC, clean try Witch hazel prn      Relevant Orders   Ambulatory referral to Obstetrics / Gynecology   Cold sore    Started on Acyclovir and report if worsens.      Relevant Medications   acyclovir (ZOVIRAX) 400 MG tablet      I have discontinued Ms. Badgett's ondansetron, traMADol, Dulaglutide, fluticasone, lisinopril, and fluconazole. I am also having her start on lisinopril and acyclovir. Additionally, I am having her maintain her [place] KIT, ONE TOUCH ULTRA TEST, glucose blood, furosemide, ONETOUCH DELICA LANCETS  13K, HUMALOG KWIKPEN, B-D ULTRAFINE III SHORT PEN, glimepiride, Vitamin D (Ergocalciferol), tobramycin, and temazepam.  Meds ordered this encounter  Medications  . temazepam (RESTORIL) 7.5 MG capsule    Sig: Take 1 capsule (7.5 mg total) by mouth at bedtime as needed for sleep.    Dispense:  30 capsule    Refill:  5  . lisinopril (PRINIVIL,ZESTRIL) 20 MG tablet    Sig: Take 1 tablet (20 mg total) by mouth daily.    Dispense:  60 tablet    Refill:  3  . acyclovir (ZOVIRAX) 400 MG tablet    Sig: Take 1 tablet (400 mg total) by mouth 5 (five) times daily.    Dispense:  35 tablet    Refill:  0    CMA served as scribe during this visit. History, Physical and Plan performed by medical provider. Documentation and orders reviewed and attested to.  Penni Homans, MD

## 2016-11-04 ENCOUNTER — Encounter: Payer: Self-pay | Admitting: Family Medicine

## 2016-11-04 DIAGNOSIS — B001 Herpesviral vesicular dermatitis: Secondary | ICD-10-CM

## 2016-11-04 HISTORY — DX: Herpesviral vesicular dermatitis: B00.1

## 2016-11-04 NOTE — Assessment & Plan Note (Signed)
Lisinopril 20 mg daily

## 2016-11-04 NOTE — Assessment & Plan Note (Signed)
hgba1c acceptable, minimize simple carbs. Increase exercise as tolerated. Continue current meds 

## 2016-11-04 NOTE — Assessment & Plan Note (Signed)
Encouraged heart healthy diet, increase exercise, avoid trans fats, consider a krill oil cap daily 

## 2016-11-04 NOTE — Assessment & Plan Note (Signed)
Patient encouraged to maintain heart healthy diet, regular exercise, adequate sleep. Consider daily probiotics. Take medications as prescribed. Reviewed labs 

## 2016-11-04 NOTE — Assessment & Plan Note (Signed)
Started on Acyclovir and report if worsens.

## 2016-11-13 ENCOUNTER — Ambulatory Visit: Payer: No Typology Code available for payment source | Admitting: Family Medicine

## 2016-11-14 ENCOUNTER — Encounter: Payer: Self-pay | Admitting: Family

## 2016-11-14 ENCOUNTER — Ambulatory Visit (INDEPENDENT_AMBULATORY_CARE_PROVIDER_SITE_OTHER): Payer: No Typology Code available for payment source | Admitting: Family

## 2016-11-14 VITALS — BP 158/78 | HR 88 | Temp 98.4°F | Resp 18 | Ht 61.0 in | Wt 273.2 lb

## 2016-11-14 DIAGNOSIS — Z23 Encounter for immunization: Secondary | ICD-10-CM | POA: Diagnosis not present

## 2016-11-14 DIAGNOSIS — E1165 Type 2 diabetes mellitus with hyperglycemia: Secondary | ICD-10-CM

## 2016-11-14 DIAGNOSIS — L03031 Cellulitis of right toe: Secondary | ICD-10-CM

## 2016-11-14 DIAGNOSIS — I10 Essential (primary) hypertension: Secondary | ICD-10-CM | POA: Diagnosis not present

## 2016-11-14 DIAGNOSIS — R0602 Shortness of breath: Secondary | ICD-10-CM

## 2016-11-14 LAB — CBC WITH DIFFERENTIAL/PLATELET
BASOS ABS: 0.1 10*3/uL (ref 0.0–0.1)
Basophils Relative: 1.2 % (ref 0.0–3.0)
EOS ABS: 0.2 10*3/uL (ref 0.0–0.7)
Eosinophils Relative: 2.4 % (ref 0.0–5.0)
HEMATOCRIT: 41.8 % (ref 36.0–46.0)
HEMOGLOBIN: 14.1 g/dL (ref 12.0–15.0)
LYMPHS PCT: 12.1 % (ref 12.0–46.0)
Lymphs Abs: 0.8 10*3/uL (ref 0.7–4.0)
MCHC: 33.8 g/dL (ref 30.0–36.0)
MCV: 88.8 fl (ref 78.0–100.0)
MONO ABS: 0.4 10*3/uL (ref 0.1–1.0)
Monocytes Relative: 6.2 % (ref 3.0–12.0)
Neutro Abs: 5.1 10*3/uL (ref 1.4–7.7)
Neutrophils Relative %: 78.1 % — ABNORMAL HIGH (ref 43.0–77.0)
Platelets: 125 10*3/uL — ABNORMAL LOW (ref 150.0–400.0)
RBC: 4.7 Mil/uL (ref 3.87–5.11)
RDW: 14.6 % (ref 11.5–15.5)
WBC: 6.6 10*3/uL (ref 4.0–10.5)

## 2016-11-14 LAB — COMPREHENSIVE METABOLIC PANEL
ALBUMIN: 3.8 g/dL (ref 3.5–5.2)
ALK PHOS: 81 U/L (ref 39–117)
ALT: 34 U/L (ref 0–35)
AST: 34 U/L (ref 0–37)
BUN: 15 mg/dL (ref 6–23)
CALCIUM: 9.5 mg/dL (ref 8.4–10.5)
CO2: 28 mEq/L (ref 19–32)
CREATININE: 0.98 mg/dL (ref 0.40–1.20)
Chloride: 100 mEq/L (ref 96–112)
GFR: 61.4 mL/min (ref >60.00)
Glucose, Bld: 317 mg/dL — ABNORMAL HIGH (ref 70–99)
Potassium: 4.1 mEq/L (ref 3.5–5.1)
SODIUM: 136 meq/L (ref 135–145)
TOTAL PROTEIN: 6.4 g/dL (ref 6.0–8.3)
Total Bilirubin: 0.6 mg/dL (ref 0.2–1.2)

## 2016-11-14 LAB — BRAIN NATRIURETIC PEPTIDE: PRO B NATRI PEPTIDE: 88 pg/mL (ref 0.0–100.0)

## 2016-11-14 MED ORDER — FUROSEMIDE 20 MG PO TABS: ORAL_TABLET | ORAL | 0 refills | Status: DC

## 2016-11-14 MED ORDER — CEPHALEXIN 500 MG PO CAPS: 500.0000 mg | ORAL_CAPSULE | Freq: Three times a day (TID) | ORAL | 0 refills | Status: DC

## 2016-11-14 NOTE — Patient Instructions (Signed)
Please increase your lasix to one tab twice daily for 3 days then decrease to one tab once daily. You will be contacted about completing your echo and scheduling a consult with cardiology. Please call if new/worsening symptoms. Go to ER if you develop chest pain.

## 2016-11-14 NOTE — Progress Notes (Signed)
Subjective:    Patient ID: Stephanie Cordova, female    DOB: Apr 25, 1955, 61 y.o.   MRN: [id number]  HPI  Stephanie Cordova is a 61 yr old female who presents today with chief complaint of toe pain.  Pain is located in the right great toe and has been present x 2 weeks.  Swelling- notes LE edema and recent increased SOB x 1 week.  Last echo performed in 2016 noted grade 1 diastolic dysfunction.  She reports that she wa 270 on 10/2.  Started back on lasix faith fully since 10/2.  She reports DOE x 1 week.  She denies chest pain.  Did have some chest heaviness yesterday, none today.  Had rapid heart rate when she took the garbage out the other day. Denies calf pain.  Denies recent long travel trips.  Wt Readings from Last 3 Encounters:  11/14/16 273 lb 3.2 oz (123.9 kg)  08/07/16 261 lb 6.4 oz (118.6 kg)  07/03/16 262 lb (118.8 kg)   HTN- maintained on lisinopril, lasix.   BP Readings from Last 3 Encounters:  11/14/16 (!) 176/88  10/30/16 (!) 145/94  08/07/16 (!) 152/92   Using ibuprofen for her knee. Had recent mri by ortho and was told bursitis and to stay off of it.    Review of Systems    see HPI  Past Medical History:  Diagnosis Date  . Abnormal liver function tests 08/07/2016  . Arthritis    b/l knees, bone on bone  . Cirrhosis of liver ([facility]) 12/21/2012   Confirmed via ultrasound per patient, she reports neg acute hepatitis panel   . Cold sore 11/04/2016  . Diabetes mellitus type 2 in obese ([facility]) 12/21/2012   takes Amaryl daily  . History of bronchitis 03/2015  . History of colon polyps    benign  . Hypertension    takes Metoprolol and Lisinopril daily  . Insomnia   . Pneumonia    hx of > 5 yrs ago  . Pulmonary nodule 08/07/2016  . Sinusitis, acute 01/26/2016  . Vaginitis 10/30/2016  . Vitamin D deficiency 04/24/2016     Social History   Social History  . Marital status: Widowed    Spouse name: N/A  . Number of children: N/A  . Years of education: N/A   Occupational  History  . Customer service    Social History Main Topics  . Smoking status: Never Smoker  . Smokeless tobacco: Never Used  . Alcohol use No  . Drug use: No  . Sexual activity: No     Comment: lives alone, widowed in 2006   Other Topics Concern  . Not on file   Social History Narrative  . No narrative on file    Past Surgical History:  Procedure Laterality Date  . ABDOMINAL HYSTERECTOMY  1996  . BREAST BIOPSY    . CHOLECYSTECTOMY N/A 09/06/2015   Procedure: LAPAROSCOPIC CHOLECYSTECTOMY;  Surgeon: Ralene Ok, MD;  Location: [facility];  Service: General;  Laterality: N/A;  . COLONOSCOPY WITH ESOPHAGOGASTRODUODENOSCOPY (EGD)    . KNEE ARTHROSCOPY Bilateral   . TONSILLECTOMY    . TUBAL LIGATION      Family History  Problem Relation Age of Onset  . Hypertension Mother   . Hyperlipidemia Mother   . Leukemia Mother   . Cancer Mother   . Hypertension Father   . Hyperlipidemia Father   . Diabetes Father   . Heart disease Father   . Alzheimer's disease Father   .  Multiple sclerosis Son   . Heart disease Maternal Grandfather     Allergies  Allergen Reactions  . Losartan Palpitations  . Lexapro [Escitalopram] Other (See Comments)    swelling  . Sulfa Antibiotics Hives and Rash    "Burning" rash    Current Outpatient Prescriptions on File Prior to Visit  Medication Sig Dispense Refill  . B-D ULTRAFINE III SHORT PEN 31G X 8 MM MISC Inject 1 pen as directed 3 (three) times daily.    . Blood Glucose Monitoring Suppl (ONE TOUCH ULTRA SYSTEM KIT) w/Device KIT Use as directed twice daily to check blood sugar.  DX E11.9 1 each 0  . furosemide (LASIX) 20 MG tablet TAKE 1 TABLET BY MOUTH EVERY DAY AS NEEDED FOR EDEMA 30 tablet 0  . glimepiride (AMARYL) 4 MG tablet Take 1 tablet (4 mg total) by mouth 2 (two) times daily. 60 tablet 3  . glucose blood (ONETOUCH VERIO) test strip 1 each by Other route 2 (two) times daily. And lancets 2/day 100 each 12  . HUMALOG KWIKPEN 100 UNIT/ML  KiwkPen Inject 45 mg as directed 3 (three) times daily.    [place] lisinopril (PRINIVIL,ZESTRIL) 20 MG tablet Take 1 tablet (20 mg total) by mouth daily. 60 tablet 3  . ONE TOUCH ULTRA TEST test strip USE AS DIRECTED TWICE A DAY 100 each 2  . ONETOUCH DELICA LANCETS 54M MISC Use as directed twice daily to check blood sugar.  DX E11.9 100 each 5  . temazepam (RESTORIL) 7.5 MG capsule Take 1 capsule (7.5 mg total) by mouth at bedtime as needed for sleep. 30 capsule 5  . Vitamin D, Ergocalciferol, (DRISDOL) 50000 units CAPS capsule Take 1 capsule (50,000 Units total) by mouth every 7 (seven) days. 12 capsule 0   No current facility-administered medications on file prior to visit.     BP (!) 176/88 (BP Location: Right Arm) Comment (Cuff Size): thigh  Pulse 88   Temp 98.4 F (36.9 C) (Oral)   Resp 18   Ht 5' 1"  (1.549 m)   Wt 273 lb 3.2 oz (123.9 kg)   LMP 01/29/1994   SpO2 99%   BMI 51.62 kg/m    Objective:   Physical Exam  Constitutional: She is oriented to person, place, and time. She appears well-developed and well-nourished.  Morbidly obese white female  HENT:  Head: Normocephalic and atraumatic.  Cardiovascular: Normal rate, regular rhythm and normal heart sounds.   No murmur heard. Pulmonary/Chest: Effort normal and breath sounds normal. No respiratory distress. She has no wheezes.  Musculoskeletal: She exhibits no edema.  Neurological: She is alert and oriented to person, place, and time.  Psychiatric: She has a normal mood and affect. Her behavior is normal. Judgment and thought content normal.  skin: mild erythema noted surrounding right lateral great toenail bed         Assessment & Plan:  Paronychia- mild, rx with keflex.  SOB- No significant edema today on exam but her weight is up a few pounds.  Advised patient to increase her Lasix 20 mg 2 twice daily for the next 3 days then return to once daily dosing. She is advised to follow-up with her primary PCP in 1 week. EKG  is performed today and personally reviewed: notes no acute changes. Normal sinus rhythm is noted. Last echo performed in 2016 noted some diastolic dysfunction. Will repeat 2-D echo and refer to cardiology for further evaluation.  Diabetes type 2-glucose was noted to be elevated at  317 on her basic metabolic panel. She will be due for a follow-up A1c at her follow-up visit next week.  Hypertension-blood pressure noted to be elevated today. We'll see how she does with the increase in Lasix. If still elevated follow-up may need further medication adjustment. BP Readings from Last 3 Encounters:  11/14/16 (!) 158/78  10/30/16 (!) 145/94  08/07/16 (!) 152/92

## 2016-11-15 ENCOUNTER — Telehealth: Payer: Self-pay | Admitting: Family

## 2016-11-15 NOTE — Telephone Encounter (Signed)
Please contact patient and let her know that her platelet level is a little low. This is not new for her. I would like her to repeat a CBC with differential in one month. Diagnosis is thrombocytopenia. Heart failure blood work is normal. Kidney function and electrolytes look good. Sugar was elevated yesterday at 317. Please keep upcoming appointment with Dr. Abner GreenspanBlyth.

## 2016-11-16 ENCOUNTER — Encounter: Payer: Self-pay | Admitting: Family

## 2016-11-16 DIAGNOSIS — M79676 Pain in unspecified toe(s): Secondary | ICD-10-CM

## 2016-11-19 NOTE — Telephone Encounter (Signed)
[name]--please see mychart message and advise? 

## 2016-11-20 ENCOUNTER — Ambulatory Visit (HOSPITAL_BASED_OUTPATIENT_CLINIC_OR_DEPARTMENT_OTHER)
Admission: RE | Admit: 2016-11-20 | Discharge: 2016-11-20 | Disposition: A | Discharge status: Home / Self Care | Payer: PRIVATE HEALTH INSURANCE | Source: Ambulatory Visit | Attending: Family | Admitting: Family

## 2016-11-20 ENCOUNTER — Encounter: Payer: Self-pay | Admitting: Family

## 2016-11-20 DIAGNOSIS — I421 Obstructive hypertrophic cardiomyopathy: Secondary | ICD-10-CM | POA: Diagnosis not present

## 2016-11-20 DIAGNOSIS — E669 Obesity, unspecified: Secondary | ICD-10-CM | POA: Insufficient documentation

## 2016-11-20 DIAGNOSIS — I10 Essential (primary) hypertension: Secondary | ICD-10-CM | POA: Diagnosis not present

## 2016-11-20 DIAGNOSIS — R0602 Shortness of breath: Secondary | ICD-10-CM | POA: Diagnosis not present

## 2016-11-20 DIAGNOSIS — E119 Type 2 diabetes mellitus without complications: Secondary | ICD-10-CM | POA: Diagnosis not present

## 2016-11-20 NOTE — Progress Notes (Signed)
  Echocardiogram 2D Echocardiogram has been performed.  Nolon RodBrown, Tony 11/20/2016, 10:17 AM

## 2016-11-22 ENCOUNTER — Ambulatory Visit (INDEPENDENT_AMBULATORY_CARE_PROVIDER_SITE_OTHER): Payer: No Typology Code available for payment source | Admitting: Family Medicine

## 2016-11-22 ENCOUNTER — Encounter: Payer: Self-pay | Admitting: Family Medicine

## 2016-11-22 VITALS — BP 180/98 | HR 80 | Temp 98.3°F | Resp 18 | Wt 274.8 lb

## 2016-11-22 DIAGNOSIS — E1169 Type 2 diabetes mellitus with other specified complication: Secondary | ICD-10-CM | POA: Diagnosis not present

## 2016-11-22 DIAGNOSIS — E669 Obesity, unspecified: Secondary | ICD-10-CM | POA: Diagnosis not present

## 2016-11-22 DIAGNOSIS — R0789 Other chest pain: Secondary | ICD-10-CM | POA: Diagnosis not present

## 2016-11-22 DIAGNOSIS — I10 Essential (primary) hypertension: Secondary | ICD-10-CM

## 2016-11-22 MED ORDER — ASPIRIN EC 81 MG PO TBEC: 81.0000 mg | DELAYED_RELEASE_TABLET | Freq: Every day | ORAL | Status: DC

## 2016-11-22 MED ORDER — FLUCONAZOLE 150 MG PO TABS: 150.0000 mg | ORAL_TABLET | Freq: Once | ORAL | 0 refills | Status: AC

## 2016-11-22 MED ORDER — LISINOPRIL 20 MG PO TABS: 20.0000 mg | ORAL_TABLET | Freq: Two times a day (BID) | ORAL | 3 refills | Status: DC

## 2016-11-22 MED ORDER — NITROGLYCERIN 0.4 MG SL SUBL: 0.4000 mg | SUBLINGUAL_TABLET | SUBLINGUAL | 3 refills | Status: AC | PRN

## 2016-11-22 NOTE — Assessment & Plan Note (Signed)
hgba1c acceptable, minimize simple carbs. Increase exercise as tolerated. Continue current meds 

## 2016-11-22 NOTE — Patient Instructions (Addendum)
Tylenol/Acetaminophen ES 500 mg start 2 tabs twice daily can to 3 x a day (max of 3000 mg in 24 hours)  DASH Eating Plan DASH stands for "Dietary Approaches to Stop Hypertension." The DASH eating plan is a healthy eating plan that has been shown to reduce high blood pressure (hypertension). It may also reduce your risk for type 2 diabetes, heart disease, and stroke. The DASH eating plan may also help with weight loss. What are tips for following this plan? General guidelines  Avoid eating more than 2,300 mg (milligrams) of salt (sodium) a day. If you have hypertension, you may need to reduce your sodium intake to 1,500 mg a day.  Limit alcohol intake to no more than 1 drink a day for nonpregnant women and 2 drinks a day for men. One drink equals 12 oz of beer, 5 oz of wine, or 1 oz of hard liquor.  Work with your health care provider to maintain a healthy body weight or to lose weight. Ask what an ideal weight is for you.  Get at least 30 minutes of exercise that causes your heart to beat faster (aerobic exercise) most days of the week. Activities may include walking, swimming, or biking.  Work with your health care provider or diet and nutrition specialist (dietitian) to adjust your eating plan to your individual calorie needs. Reading food labels  Check food labels for the amount of sodium per serving. Choose foods with less than 5 percent of the Daily Value of sodium. Generally, foods with less than 300 mg of sodium per serving fit into this eating plan.  To find whole grains, look for the word "whole" as the first word in the ingredient list. Shopping  Buy products labeled as "low-sodium" or "no salt added."  Buy fresh foods. Avoid canned foods and premade or frozen meals. Cooking  Avoid adding salt when cooking. Use salt-free seasonings or herbs instead of table salt or sea salt. Check with your health care provider or pharmacist before using salt substitutes.  Do not fry foods.  Cook foods using healthy methods such as baking, boiling, grilling, and broiling instead.  Cook with heart-healthy oils, such as olive, canola, soybean, or sunflower oil. Meal planning   Eat a balanced diet that includes: ? 5 or more servings of fruits and vegetables each day. At each meal, try to fill half of your plate with fruits and vegetables. ? Up to 6-8 servings of whole grains each day. ? Less than 6 oz of lean meat, poultry, or fish each day. A 3-oz serving of meat is about the same size as a deck of cards. One egg equals 1 oz. ? 2 servings of low-fat dairy each day. ? A serving of nuts, seeds, or beans 5 times each week. ? Heart-healthy fats. Healthy fats called Omega-3 fatty acids are found in foods such as flaxseeds and coldwater fish, like sardines, salmon, and mackerel.  Limit how much you eat of the following: ? Canned or prepackaged foods. ? Food that is high in trans fat, such as fried foods. ? Food that is high in saturated fat, such as fatty meat. ? Sweets, desserts, sugary drinks, and other foods with added sugar. ? Full-fat dairy products.  Do not salt foods before eating.  Try to eat at least 2 vegetarian meals each week.  Eat more home-cooked food and less restaurant, buffet, and fast food.  When eating at a restaurant, ask that your food be prepared with less salt or  no salt, if possible. What foods are recommended? The items listed may not be a complete list. Talk with your dietitian about what dietary choices are best for you. Grains Whole-grain or whole-wheat bread. Whole-grain or whole-wheat pasta. Brown rice. [place]. Bulgur. Whole-grain and low-sodium cereals. Pita bread. Low-fat, low-sodium crackers. Whole-wheat flour tortillas. Vegetables Fresh or frozen vegetables (raw, steamed, roasted, or grilled). Low-sodium or reduced-sodium tomato and vegetable juice. Low-sodium or reduced-sodium tomato sauce and tomato paste. Low-sodium or reduced-sodium  canned vegetables. Fruits All fresh, dried, or frozen fruit. Canned fruit in natural juice (without added sugar). Meat and other protein foods Skinless chicken or [place]. Ground chicken or [place]. Pork with fat trimmed off. Fish and seafood. Egg whites. Dried beans, peas, or lentils. Unsalted nuts, nut butters, and seeds. Unsalted canned beans. Lean cuts of beef with fat trimmed off. Low-sodium, lean deli meat. Dairy Low-fat (1%) or fat-free (skim) milk. Fat-free, low-fat, or reduced-fat cheeses. Nonfat, low-sodium ricotta or cottage cheese. Low-fat or nonfat yogurt. Low-fat, low-sodium cheese. Fats and oils Soft margarine without trans fats. Vegetable oil. Low-fat, reduced-fat, or light mayonnaise and salad dressings (reduced-sodium). Canola, safflower, olive, soybean, and sunflower oils. Avocado. Seasoning and other foods Herbs. Spices. Seasoning mixes without salt. Unsalted popcorn and pretzels. Fat-free sweets. What foods are not recommended? The items listed may not be a complete list. Talk with your dietitian about what dietary choices are best for you. Grains Baked goods made with fat, such as croissants, muffins, or some breads. Dry pasta or rice meal packs. Vegetables Creamed or fried vegetables. Vegetables in a cheese sauce. Regular canned vegetables (not low-sodium or reduced-sodium). Regular canned tomato sauce and paste (not low-sodium or reduced-sodium). Regular tomato and vegetable juice (not low-sodium or reduced-sodium). Angie Fava. Olives. Fruits Canned fruit in a light or heavy syrup. Fried fruit. Fruit in cream or butter sauce. Meat and other protein foods Fatty cuts of meat. Ribs. Fried meat. Berniece Salines. Sausage. Bologna and other processed lunch meats. Salami. Fatback. Hotdogs. Bratwurst. Salted nuts and seeds. Canned beans with added salt. Canned or smoked fish. Whole eggs or egg yolks. Chicken or [place] with skin. Dairy Whole or 2% milk, cream, and half-and-half. Whole or  full-fat cream cheese. Whole-fat or sweetened yogurt. Full-fat cheese. Nondairy creamers. Whipped toppings. Processed cheese and cheese spreads. Fats and oils Butter. Stick margarine. Lard. Shortening. Ghee. Bacon fat. Tropical oils, such as coconut, palm kernel, or palm oil. Seasoning and other foods Salted popcorn and pretzels. Onion salt, garlic salt, seasoned salt, table salt, and sea salt. Worcestershire sauce. Tartar sauce. Barbecue sauce. Teriyaki sauce. Soy sauce, including reduced-sodium. Steak sauce. Canned and packaged gravies. Fish sauce. Oyster sauce. Cocktail sauce. Horseradish that you find on the shelf. Ketchup. Mustard. Meat flavorings and tenderizers. Bouillon cubes. Hot sauce and Tabasco sauce. Premade or packaged marinades. Premade or packaged taco seasonings. Relishes. Regular salad dressings. Where to find more information:  National Heart, Lung, and [place]: https://wilson-eaton.com/  American Heart Association: www.heart.org Summary  The DASH eating plan is a healthy eating plan that has been shown to reduce high blood pressure (hypertension). It may also reduce your risk for type 2 diabetes, heart disease, and stroke.  With the DASH eating plan, you should limit salt (sodium) intake to 2,300 mg a day. If you have hypertension, you may need to reduce your sodium intake to 1,500 mg a day.  When on the DASH eating plan, aim to eat more fresh fruits and vegetables, whole grains, lean proteins, low-fat dairy, and  heart-healthy fats.  Work with your health care provider or diet and nutrition specialist (dietitian) to adjust your eating plan to your individual calorie needs. This information is not intended to replace advice given to you by your health care provider. Make sure you discuss any questions you have with your health care provider. Document Released: 01/04/2011 Document Revised: 01/09/2016 Document Reviewed: 01/09/2016 Elsevier Interactive Patient Education  2017  [employer].

## 2016-11-22 NOTE — Progress Notes (Signed)
Subjective:  I acted as a Education administrator for Dr. Charlett Blake. [place], [place]  Patient ID: Stephanie Cordova, female    DOB: 05-28-1955, 61 y.o.   MRN: [id number]  No chief complaint on file.   HPI  Patient is in today for a 1 week follow up. She was seen for paronychia and that has improved with keflex. No fevers, chills. She had noted some episodes of SOB prior to last visit but that has improved some. Does still have some SOB with significant exertion. Her edema is still adequately controlled but she is endorsing some atypical chest discomfort. It does not awaken her but does occur randomly. No associated symptoms most times. Denies palp/HA/congestion/fevers/GI or GU c/o. Taking meds as prescribed  Patient Care Team: Mosie Lukes, MD as PCP - General (Family Medicine)   Past Medical History:  Diagnosis Date  . Abnormal liver function tests 08/07/2016  . Arthritis    b/l knees, bone on bone  . Cirrhosis of liver ([facility]) 12/21/2012   Confirmed via ultrasound per patient, she reports neg acute hepatitis panel   . Cold sore 11/04/2016  . Diabetes mellitus type 2 in obese ([facility] [facility]) 12/21/2012   takes Amaryl daily  . History of bronchitis 03/2015  . History of colon polyps    benign  . Hypertension    takes Metoprolol and Lisinopril daily  . Insomnia   . Pneumonia    hx of > 5 yrs ago  . Pulmonary nodule 08/07/2016  . Sinusitis, acute 01/26/2016  . Vaginitis 10/30/2016  . Vitamin D deficiency 04/24/2016    Past Surgical History:  Procedure Laterality Date  . ABDOMINAL HYSTERECTOMY  1996  . BREAST BIOPSY    . CHOLECYSTECTOMY N/A 09/06/2015   Procedure: LAPAROSCOPIC CHOLECYSTECTOMY;  Surgeon: Ralene Ok, MD;  Location: [facility];  Service: General;  Laterality: N/A;  . COLONOSCOPY WITH ESOPHAGOGASTRODUODENOSCOPY (EGD)    . KNEE ARTHROSCOPY Bilateral   . TONSILLECTOMY    . TUBAL LIGATION      Family History  Problem Relation Age of Onset  . Hypertension Mother   . Hyperlipidemia Mother   .  Leukemia Mother   . Cancer Mother   . Hypertension Father   . Hyperlipidemia Father   . Diabetes Father   . Heart disease Father   . Alzheimer's disease Father   . Multiple sclerosis Son   . Heart disease Maternal Grandfather     Social History   Social History  . Marital status: Widowed    Spouse name: N/A  . Number of children: N/A  . Years of education: N/A   Occupational History  . Customer service    Social History Main Topics  . Smoking status: Never Smoker  . Smokeless tobacco: Never Used  . Alcohol use No  . Drug use: No  . Sexual activity: No     Comment: lives alone, widowed in 2006   Other Topics Concern  . Not on file   Social History Narrative  . No narrative on file    Outpatient Medications Prior to Visit  Medication Sig Dispense Refill  . B-D ULTRAFINE III SHORT PEN 31G X 8 MM MISC Inject 1 pen as directed 3 (three) times daily.    . Blood Glucose Monitoring Suppl (ONE TOUCH ULTRA SYSTEM KIT) w/Device KIT Use as directed twice daily to check blood sugar.  DX E11.9 1 each 0  . cephALEXin (KEFLEX) 500 MG capsule Take 1 capsule (500 mg total) by mouth 3 (three)  times daily. 21 capsule 0  . furosemide (LASIX) 20 MG tablet Take 1 tablet twice daily for 3 days, then decrease to 1 tab once daily 60 tablet 0  . glimepiride (AMARYL) 4 MG tablet Take 1 tablet (4 mg total) by mouth 2 (two) times daily. 60 tablet 3  . glucose blood (ONETOUCH VERIO) test strip 1 each by Other route 2 (two) times daily. And lancets 2/day 100 each 12  . HUMALOG KWIKPEN 100 UNIT/ML KiwkPen Inject 45 mg as directed 3 (three) times daily.    . ONE TOUCH ULTRA TEST test strip USE AS DIRECTED TWICE A DAY 100 each 2  . ONETOUCH DELICA LANCETS 11E MISC Use as directed twice daily to check blood sugar.  DX E11.9 100 each 5  . temazepam (RESTORIL) 7.5 MG capsule Take 1 capsule (7.5 mg total) by mouth at bedtime as needed for sleep. 30 capsule 5  . Vitamin D, Ergocalciferol, (DRISDOL) 50000  units CAPS capsule Take 1 capsule (50,000 Units total) by mouth every 7 (seven) days. 12 capsule 0  . lisinopril (PRINIVIL,ZESTRIL) 20 MG tablet Take 1 tablet (20 mg total) by mouth daily. 60 tablet 3   No facility-administered medications prior to visit.     Allergies  Allergen Reactions  . Losartan Palpitations  . Lexapro [Escitalopram] Other (See Comments)    swelling  . Sulfa Antibiotics Hives and Rash    "Burning" rash    Review of Systems  Constitutional: Negative for fever and malaise/fatigue.  HENT: Negative for congestion.   Eyes: Negative for blurred vision.  Respiratory: Positive for shortness of breath.   Cardiovascular: Positive for chest pain. Negative for palpitations and leg swelling.  Gastrointestinal: Negative for abdominal pain, blood in stool and nausea.  Genitourinary: Negative for dysuria and frequency.  Musculoskeletal: Negative for falls.  Skin: Negative for rash.  Neurological: Negative for dizziness, loss of consciousness and headaches.  Endo/Heme/Allergies: Negative for environmental allergies.  Psychiatric/Behavioral: Negative for depression. The patient is not nervous/anxious.        Objective:    Physical Exam  Constitutional: She is oriented to person, place, and time. She appears well-developed and well-nourished. No distress.  HENT:  Head: Normocephalic and atraumatic.  Nose: Nose normal.  Eyes: Right eye exhibits no discharge. Left eye exhibits no discharge.  Neck: Normal range of motion. Neck supple.  Cardiovascular: Normal rate and regular rhythm.   No murmur heard. Pulmonary/Chest: Effort normal and breath sounds normal.  Abdominal: Soft. Bowel sounds are normal. There is no tenderness.  Musculoskeletal: She exhibits no edema.  Neurological: She is alert and oriented to person, place, and time.  Skin: Skin is warm and dry.  Psychiatric: She has a normal mood and affect.  Nursing note and vitals reviewed.   BP (!) 180/98   Pulse  80   Temp 98.3 F (36.8 C) (Oral)   Resp 18   Wt 274 lb 12.8 oz (124.6 kg)   LMP 01/29/1994   SpO2 99%   BMI 51.92 kg/m  Wt Readings from Last 3 Encounters:  11/22/16 274 lb 12.8 oz (124.6 kg)  11/14/16 273 lb 3.2 oz (123.9 kg)  08/07/16 261 lb 6.4 oz (118.6 kg)   BP Readings from Last 3 Encounters:  11/22/16 (!) 180/98  11/14/16 (!) 158/78  10/30/16 (!) 145/94     Immunization History  Administered Date(s) Administered  . Influenza,inj,Quad PF,6+ Mos 11/14/2016  . Pneumococcal-Unspecified 12/30/2011  . Td 01/29/2009    Health Maintenance  Topic  Date Due  . Hepatitis C Screening  [birth year]  . HIV Screening  04/01/1971  . PAP SMEAR  04/24/2017 (Originally 01/29/2013)  . PNEUMOCOCCAL POLYSACCHARIDE VACCINE (2) 12/29/2016  . OPHTHALMOLOGY EXAM  12/29/2016  . HEMOGLOBIN A1C  02/07/2017  . FOOT EXAM  04/24/2017  . MAMMOGRAM  06/06/2017  . TETANUS/TDAP  01/30/2019  . COLONOSCOPY  11/27/2022  . INFLUENZA VACCINE  Completed    Lab Results  Component Value Date   WBC 6.6 11/14/2016   HGB 14.1 11/14/2016   HCT 41.8 11/14/2016   PLT 125.0 (L) 11/14/2016   GLUCOSE 317 (H) 11/14/2016   CHOL 149 08/07/2016   TRIG 72.0 08/07/2016   HDL 63.00 08/07/2016   LDLCALC 72 08/07/2016   ALT 34 11/14/2016   AST 34 11/14/2016   NA 136 11/14/2016   K 4.1 11/14/2016   CL 100 11/14/2016   CREATININE 0.98 11/14/2016   BUN 15 11/14/2016   CO2 28 11/14/2016   TSH 1.42 08/07/2016   HGBA1C 7.9 (H) 08/07/2016   MICROALBUR 2.6 (H) 12/09/2015    Lab Results  Component Value Date   TSH 1.42 08/07/2016   Lab Results  Component Value Date   WBC 6.6 11/14/2016   HGB 14.1 11/14/2016   HCT 41.8 11/14/2016   MCV 88.8 11/14/2016   PLT 125.0 (L) 11/14/2016   Lab Results  Component Value Date   NA 136 11/14/2016   K 4.1 11/14/2016   CO2 28 11/14/2016   GLUCOSE 317 (H) 11/14/2016   BUN 15 11/14/2016   CREATININE 0.98 11/14/2016   BILITOT 0.6 11/14/2016   ALKPHOS 81 11/14/2016    AST 34 11/14/2016   ALT 34 11/14/2016   PROT 6.4 11/14/2016   ALBUMIN 3.8 11/14/2016   CALCIUM 9.5 11/14/2016   ANIONGAP 9 03/05/2016   GFR 61.40 11/14/2016   Lab Results  Component Value Date   CHOL 149 08/07/2016   Lab Results  Component Value Date   HDL 63.00 08/07/2016   Lab Results  Component Value Date   LDLCALC 72 08/07/2016   Lab Results  Component Value Date   TRIG 72.0 08/07/2016   Lab Results  Component Value Date   CHOLHDL 2 08/07/2016   Lab Results  Component Value Date   HGBA1C 7.9 (H) 08/07/2016         Assessment & Plan:   Problem List Items Addressed This Visit    HTN (hypertension) - Primary    Poorly controlled, improved on recheck and asymptomatic. She had a very high sodium meal of BBQ and a large iced tea prior to visit. She rushed in as well. Will increase Lisinopril to 20 mg po bid and recheck BP with CMP in  Encouraged heart healthy diet such as the DASH diet and exercise as tolerated.       Relevant Medications   lisinopril (PRINIVIL,ZESTRIL) 20 MG tablet   nitroGLYCERIN (NITROSTAT) 0.4 MG SL tablet   aspirin EC 81 MG tablet   Other Relevant Orders   Ambulatory referral to Cardiology   Diabetes mellitus type 2 in obese ([facility])    hgba1c acceptable, minimize simple carbs. Increase exercise as tolerated. Continue current meds      Relevant Medications   lisinopril (PRINIVIL,ZESTRIL) 20 MG tablet   aspirin EC 81 MG tablet   Other Relevant Orders   Ambulatory referral to Cardiology   Atypical chest pain    No symptoms presently but is describing some intermittent episodes of substernal chest discomfort usually without  associated symptoms although at times she has some SOB not always at same time. With multiple cardiac risk factors she is referred to cardiology for further consideration. She is advised to seek care if symptoms occur and do not resolve prior to appt. She is given NTG to use and asked to start a 81 mg aspirin daily       Relevant Orders   Ambulatory referral to Cardiology      I have changed Ms. Delossantos's lisinopril. I am also having her start on fluconazole, nitroGLYCERIN, and aspirin EC. Additionally, I am having her maintain her ONE TOUCH ULTRA SYSTEM KIT, ONE TOUCH ULTRA TEST, glucose blood, ONETOUCH DELICA LANCETS 06C, HUMALOG KWIKPEN, B-D ULTRAFINE III SHORT PEN, glimepiride, Vitamin D (Ergocalciferol), temazepam, furosemide, and cephALEXin.  Meds ordered this encounter  Medications  . fluconazole (DIFLUCAN) 150 MG tablet    Sig: Take 1 tablet (150 mg total) by mouth once.    Dispense:  1 tablet    Refill:  0  . lisinopril (PRINIVIL,ZESTRIL) 20 MG tablet    Sig: Take 1 tablet (20 mg total) by mouth 2 (two) times daily.    Dispense:  60 tablet    Refill:  3  . nitroGLYCERIN (NITROSTAT) 0.4 MG SL tablet    Sig: Place 1 tablet (0.4 mg total) under the tongue every 5 (five) minutes as needed for chest pain.    Dispense:  25 tablet    Refill:  3  . aspirin EC 81 MG tablet    Sig: Take 1 tablet (81 mg total) by mouth daily.    CMA served as Education administrator during this visit. History, Physical and Plan performed by medical provider. Documentation and orders reviewed and attested to.  Penni Homans, MD

## 2016-11-22 NOTE — Assessment & Plan Note (Addendum)
Poorly controlled, improved on recheck and asymptomatic. She had a very high sodium meal of BBQ and a large iced tea prior to visit. She rushed in as well. Will increase Lisinopril to 20 mg po bid and recheck BP with CMP in  Encouraged heart healthy diet such as the DASH diet and exercise as tolerated.

## 2016-11-25 DIAGNOSIS — R0789 Other chest pain: Secondary | ICD-10-CM | POA: Insufficient documentation

## 2016-11-25 NOTE — Assessment & Plan Note (Signed)
No symptoms presently but is describing some intermittent episodes of substernal chest discomfort usually without associated symptoms although at times she has some SOB not always at same time. With multiple cardiac risk factors she is referred to cardiology for further consideration. She is advised to seek care if symptoms occur and do not resolve prior to appt. She is given NTG to use and asked to start a 81 mg aspirin daily

## 2016-11-29 ENCOUNTER — Ambulatory Visit: Payer: Self-pay | Admitting: Podiatry

## 2016-12-03 ENCOUNTER — Encounter: Payer: No Typology Code available for payment source | Admitting: Obstetrics & Gynecology

## 2016-12-04 ENCOUNTER — Other Ambulatory Visit: Payer: Self-pay | Admitting: Emergency Medicine

## 2016-12-04 ENCOUNTER — Ambulatory Visit: Payer: No Typology Code available for payment source

## 2016-12-04 ENCOUNTER — Other Ambulatory Visit: Payer: Self-pay | Admitting: Cardiology

## 2016-12-04 ENCOUNTER — Other Ambulatory Visit: Payer: No Typology Code available for payment source

## 2016-12-04 ENCOUNTER — Ambulatory Visit (INDEPENDENT_AMBULATORY_CARE_PROVIDER_SITE_OTHER): Payer: No Typology Code available for payment source | Admitting: Cardiology

## 2016-12-04 ENCOUNTER — Encounter: Payer: Self-pay | Admitting: Cardiology

## 2016-12-04 VITALS — BP 148/92 | HR 60 | Resp 10 | Ht 61.0 in | Wt 271.0 lb

## 2016-12-04 DIAGNOSIS — I10 Essential (primary) hypertension: Secondary | ICD-10-CM

## 2016-12-04 DIAGNOSIS — I421 Obstructive hypertrophic cardiomyopathy: Secondary | ICD-10-CM | POA: Insufficient documentation

## 2016-12-04 DIAGNOSIS — Z01812 Encounter for preprocedural laboratory examination: Secondary | ICD-10-CM

## 2016-12-04 DIAGNOSIS — E1169 Type 2 diabetes mellitus with other specified complication: Secondary | ICD-10-CM

## 2016-12-04 DIAGNOSIS — E669 Obesity, unspecified: Secondary | ICD-10-CM | POA: Diagnosis not present

## 2016-12-04 DIAGNOSIS — I209 Angina pectoris, unspecified: Secondary | ICD-10-CM | POA: Insufficient documentation

## 2016-12-04 MED ORDER — METOPROLOL TARTRATE 25 MG PO TABS: 25.0000 mg | ORAL_TABLET | Freq: Two times a day (BID) | ORAL | 6 refills | Status: DC

## 2016-12-04 MED ORDER — ROSUVASTATIN CALCIUM 10 MG PO TABS: 10.0000 mg | ORAL_TABLET | Freq: Every day | ORAL | 6 refills | Status: DC

## 2016-12-04 NOTE — Progress Notes (Signed)
Cardiology Consultation:    Date:  12/04/2016   ID:  Stephanie Cordova, DOB 1955-09-28, MRN [id number]  PCP:  Mosie Lukes, MD  Cardiologist:  Jenne Campus, MD   Referring MD: Mosie Lukes, MD   Chief Complaint  Patient presents with  . Chest Pain  I am having chest pain shortness of breath  History of Present Illness:    Stephanie Cordova is a 61 y.o. female who is being seen today for the evaluation of chest pain and shortness of breath at the request of Mosie Lukes, MD.  She has history of long-standing diabetes as well as hypertension.  On top of that she is morbidly obese does have dyslipidemia.  Apparently she is not on statin because there is some issue with questionable cirrhosis of the liver.  He comes to me because she does have exertional chest tightness as well as shortness of breath.  She tells me that that is happening for about 3 weeks anytime she does some effort she will get tightness in the chest that is relieved by rest usually takes about 2-3 minutes for the pain to go away however lately the pain became slightly longer now it takes about 5 minutes to relieve the pain.  She described this pain as 5 in scale up to 10 retrosternal tightness associated with shortness of breath.  It happens typically of the day that she need to carry garbage can to be straight and today is today she is already worried that she will have another episode of pain.  She does have multiple risk factors for coronary artery disease namely diabetes, hypertension dyslipidemia she is not physically active and morbidly obese. Past Medical History:  Diagnosis Date  . Abnormal liver function tests 08/07/2016  . Arthritis    b/l knees, bone on bone  . Cirrhosis of liver ([place]) 12/21/2012   Confirmed via ultrasound per patient, she reports neg acute hepatitis panel   . Cold sore 11/04/2016  . Diabetes mellitus type 2 in obese ([place]) 12/21/2012   takes Amaryl daily  . History of bronchitis 03/2015  .  History of colon polyps    benign  . Hypertension    takes Metoprolol and Lisinopril daily  . Insomnia   . Pneumonia    hx of > 5 yrs ago  . Pulmonary nodule 08/07/2016  . Sinusitis, acute 01/26/2016  . Vaginitis 10/30/2016  . Vitamin D deficiency 04/24/2016    Past Surgical History:  Procedure Laterality Date  . ABDOMINAL HYSTERECTOMY  1996  . BREAST BIOPSY    . COLONOSCOPY WITH ESOPHAGOGASTRODUODENOSCOPY (EGD)    . KNEE ARTHROSCOPY Bilateral   . TONSILLECTOMY    . TUBAL LIGATION      Current Medications: Current Meds  Medication Sig  . aspirin EC 81 MG tablet Take 1 tablet (81 mg total) by mouth daily.  . B-D ULTRAFINE III SHORT PEN 31G X 8 MM MISC Inject 1 pen as directed 3 (three) times daily.  . Blood Glucose Monitoring Suppl (ONE TOUCH ULTRA SYSTEM KIT) w/Device KIT Use as directed twice daily to check blood sugar.  DX E11.9  . cephALEXin (KEFLEX) 500 MG capsule Take 1 capsule (500 mg total) by mouth 3 (three) times daily.  . furosemide (LASIX) 20 MG tablet Take 1 tablet twice daily for 3 days, then decrease to 1 tab once daily  . glimepiride (AMARYL) 4 MG tablet Take 1 tablet (4 mg total) by mouth 2 (two) times daily.  Marland Kitchen  glucose blood (ONETOUCH VERIO) test strip 1 each by Other route 2 (two) times daily. And lancets 2/day  . HUMALOG KWIKPEN 100 UNIT/ML KiwkPen Inject 45 mg as directed 3 (three) times daily.  Marland Kitchen lisinopril (PRINIVIL,ZESTRIL) 20 MG tablet Take 1 tablet (20 mg total) by mouth 2 (two) times daily.  . nitroGLYCERIN (NITROSTAT) 0.4 MG SL tablet Place 1 tablet (0.4 mg total) under the tongue every 5 (five) minutes as needed for chest pain.  . ONE TOUCH ULTRA TEST test strip USE AS DIRECTED TWICE A DAY  . ONETOUCH DELICA LANCETS 40J MISC Use as directed twice daily to check blood sugar.  DX E11.9  . temazepam (RESTORIL) 7.5 MG capsule Take 1 capsule (7.5 mg total) by mouth at bedtime as needed for sleep.  . Vitamin D, Ergocalciferol, (DRISDOL) 50000 units CAPS  capsule Take 1 capsule (50,000 Units total) by mouth every 7 (seven) days.     Allergies:   Losartan; Lexapro [escitalopram]; and Sulfa antibiotics   Social History   Socioeconomic History  . Marital status: Widowed    Spouse name: None  . Number of children: None  . Years of education: None  . Highest education level: None  Social Needs  . Financial resource strain: None  . Food insecurity - worry: None  . Food insecurity - inability: None  . Transportation needs - medical: None  . Transportation needs - non-medical: None  Occupational History  . Occupation: Therapist, art  Tobacco Use  . Smoking status: Never Smoker  . Smokeless tobacco: Never Used  Substance and Sexual Activity  . Alcohol use: No    Alcohol/week: 0.0 oz  . Drug use: No  . Sexual activity: No    Comment: lives alone, widowed in 2006  Other Topics Concern  . None  Social History Narrative  . None     Family History: The patient's family history includes Alzheimer's disease in her father; Cancer in her mother; Diabetes in her father; Heart disease in her father and maternal grandfather; Hyperlipidemia in her father and mother; Hypertension in her father and mother; Leukemia in her mother; Multiple sclerosis in her son. ROS:   Please see the history of present illness.    All 14 point review of systems negative except as described per history of present illness.  EKGs/Labs/Other Studies Reviewed:    The following studies were reviewed today   EKG:  EKG is  ordered today.  The ekg ordered today demonstrates normal sinus rhythm normal PR interval normal QRS complex duration morphology except for poor R wave progression through precordium, nonspecific ST segment changes  Recent Labs: 08/07/2016: TSH 1.42 11/14/2016: ALT 34; BUN 15; Creatinine, Ser 0.98; Hemoglobin 14.1; Platelets 125.0; Potassium 4.1; Pro B Natriuretic peptide (BNP) 88.0; Sodium 136  Recent Lipid Panel    Component Value Date/Time     CHOL 149 08/07/2016 1024   TRIG 72.0 08/07/2016 1024   HDL 63.00 08/07/2016 1024   CHOLHDL 2 08/07/2016 1024   VLDL 14.4 08/07/2016 1024   LDLCALC 72 08/07/2016 1024    Physical Exam:    VS:  BP (!) 148/92   Pulse 60   Resp 10   Ht _0  (1.549 m)   Wt 271 lb (122.9 kg)   LMP 01/29/1994   BMI 51.21 kg/m     Wt Readings from Last 3 Encounters:  12/04/16 271 lb (122.9 kg)  11/22/16 274 lb 12.8 oz (124.6 kg)  11/14/16 273 lb 3.2 oz (123.9 kg)  GEN:  Well nourished, well developed in no acute distress HEENT: Normal NECK: No JVD; No carotid bruits LYMPHATICS: No lymphadenopathy CARDIAC: RRR, systolic ejection murmur grade 1/6 to 2/6 best heard at the right upper portion of the sternum, no rubs, no gallops RESPIRATORY:  Clear to auscultation without rales, wheezing or rhonchi  ABDOMEN: Soft, non-tender, non-distended MUSCULOSKELETAL:  No edema; No deformity  SKIN: Warm and dry NEUROLOGIC:  Alert and oriented x 3 PSYCHIATRIC:  Normal affect   ASSESSMENT:    1. Essential hypertension   2. Angina pectoris ([place])   3. Diabetes mellitus type 2 in obese [facility])    PLAN:    In order of problems listed above:  1. Typical angina pectoris: I had a long discussion with her about what to do with the situation and options are medical therapy versus stress testing versus cardiac catheterization.  In her clinical scenario however because of the typical nature of her symptoms as well as multiple risk factors for coronary artery disease and high risk for having coronary artery disease I think cardiac catheterization will be the way to go.  I explained procedure to her including all risk benefits as well as alternatives.  She agreed to proceed.  I will check her EKG today to see if I can put her on beta-blocker.  As she does have already nitroglycerin.  I advised her to avoid any extreme exertion.  She is already on aspirin which I will continue.  I will review her chart and see if I can  put her on statin.  She does have to be on high intensity statin.  Does have nitroglycerin she knows how to use it 2. Systolic murmur: Most likely related to mild obstruction in the left ventricular outflow tract.  She does have some features of hypertrophic obstructive cardiomyopathy namely a ASH on last echocardiogram, but there was no SAM, gradient across left ventricular outflow tract was only mild 15 mmHg mean.  Think we are at this stage that this problem can be managed medically.  Beta-blocker will be very beneficial to her.  I will put her on 25 mg metoprolol twice a day her EKG done today showed sinus rhythm poor R wave progression anterior precordium, normal PR interval, nonspecific ST segment changes. 3. Essential hypertension: Her blood pressure is elevated today hopefully addition of beta-blocker will help with that. 4. Diabetes mellitus: Followed by primary care physician apparently her hemoglobin A1c is getting better. 5. Morbid obesity: Obviously is a problem she understand that she is trying to do some exercises to lose weight but she does have a primary knee with complicated situation on top of that I told her now noted to exercise aggressively until we have situation more clear after cardiac catheterization. 6. Sleep apnea: She did have a sleep study done she was recommended mask however she could not use it.  This is something we will revisit later  Overall lady with multiple risk factors for coronary artery disease and fairly typical exertional angina pectoris.  She elected to proceed with cardiac catheterization which we will do   Medication Adjustments/Labs and Tests Ordered: Current medicines are reviewed at length with the patient today.  Concerns regarding medicines are outlined above.  No orders of the defined types were placed in this encounter.  No orders of the defined types were placed in this encounter.   Signed, Park Liter, MD, [facility]. 12/04/2016 10:57 AM     [facility]

## 2016-12-04 NOTE — H&P (View-Only) (Signed)
Cardiology Consultation:    Date:  12/04/2016   ID:  Stephanie Cordova, DOB [birth year], MRN [id number]  PCP:  Mosie Lukes, MD  Cardiologist:  Jenne Campus, MD   Referring MD: Mosie Lukes, MD   Chief Complaint  Patient presents with  . Chest Pain  I am having chest pain shortness of breath  History of Present Illness:    Stephanie Cordova is a 61 y.o. female who is being seen today for the evaluation of chest pain and shortness of breath at the request of Mosie Lukes, MD.  She has history of long-standing diabetes as well as hypertension.  On top of that she is morbidly obese does have dyslipidemia.  Apparently she is not on statin because there is some issue with questionable cirrhosis of the liver.  He comes to me because she does have exertional chest tightness as well as shortness of breath.  She tells me that that is happening for about 3 weeks anytime she does some effort she will get tightness in the chest that is relieved by rest usually takes about 2-3 minutes for the pain to go away however lately the pain became slightly longer now it takes about 5 minutes to relieve the pain.  She described this pain as 5 in scale up to 10 retrosternal tightness associated with shortness of breath.  It happens typically of the day that she need to carry garbage can to be straight and today is today she is already worried that she will have another episode of pain.  She does have multiple risk factors for coronary artery disease namely diabetes, hypertension dyslipidemia she is not physically active and morbidly obese. Past Medical History:  Diagnosis Date  . Abnormal liver function tests 08/07/2016  . Arthritis    b/l knees, bone on bone  . Cirrhosis of liver ([place]) 12/21/2012   Confirmed via ultrasound per patient, she reports neg acute hepatitis panel   . Cold sore 11/04/2016  . Diabetes mellitus type 2 in obese ([place]) 12/21/2012   takes Amaryl daily  . History of bronchitis 03/2015  .  History of colon polyps    benign  . Hypertension    takes Metoprolol and Lisinopril daily  . Insomnia   . Pneumonia    hx of > 5 yrs ago  . Pulmonary nodule 08/07/2016  . Sinusitis, acute 01/26/2016  . Vaginitis 10/30/2016  . Vitamin D deficiency 04/24/2016    Past Surgical History:  Procedure Laterality Date  . ABDOMINAL HYSTERECTOMY  1996  . BREAST BIOPSY    . COLONOSCOPY WITH ESOPHAGOGASTRODUODENOSCOPY (EGD)    . KNEE ARTHROSCOPY Bilateral   . TONSILLECTOMY    . TUBAL LIGATION      Current Medications: Current Meds  Medication Sig  . aspirin EC 81 MG tablet Take 1 tablet (81 mg total) by mouth daily.  . B-D ULTRAFINE III SHORT PEN 31G X 8 MM MISC Inject 1 pen as directed 3 (three) times daily.  . Blood Glucose Monitoring Suppl (ONE TOUCH ULTRA SYSTEM KIT) w/Device KIT Use as directed twice daily to check blood sugar.  DX E11.9  . cephALEXin (KEFLEX) 500 MG capsule Take 1 capsule (500 mg total) by mouth 3 (three) times daily.  . furosemide (LASIX) 20 MG tablet Take 1 tablet twice daily for 3 days, then decrease to 1 tab once daily  . glimepiride (AMARYL) 4 MG tablet Take 1 tablet (4 mg total) by mouth 2 (two) times daily.  Marland Kitchen  glucose blood (ONETOUCH VERIO) test strip 1 each by Other route 2 (two) times daily. And lancets 2/day  . HUMALOG KWIKPEN 100 UNIT/ML KiwkPen Inject 45 mg as directed 3 (three) times daily.  Marland Kitchen lisinopril (PRINIVIL,ZESTRIL) 20 MG tablet Take 1 tablet (20 mg total) by mouth 2 (two) times daily.  . nitroGLYCERIN (NITROSTAT) 0.4 MG SL tablet Place 1 tablet (0.4 mg total) under the tongue every 5 (five) minutes as needed for chest pain.  . ONE TOUCH ULTRA TEST test strip USE AS DIRECTED TWICE A DAY  . ONETOUCH DELICA LANCETS 43P MISC Use as directed twice daily to check blood sugar.  DX E11.9  . temazepam (RESTORIL) 7.5 MG capsule Take 1 capsule (7.5 mg total) by mouth at bedtime as needed for sleep.  . Vitamin D, Ergocalciferol, (DRISDOL) 50000 units CAPS  capsule Take 1 capsule (50,000 Units total) by mouth every 7 (seven) days.     Allergies:   Losartan; Lexapro [escitalopram]; and Sulfa antibiotics   Social History   Socioeconomic History  . Marital status: Widowed    Spouse name: None  . Number of children: None  . Years of education: None  . Highest education level: None  Social Needs  . Financial resource strain: None  . Food insecurity - worry: None  . Food insecurity - inability: None  . Transportation needs - medical: None  . Transportation needs - non-medical: None  Occupational History  . Occupation: Therapist, art  Tobacco Use  . Smoking status: Never Smoker  . Smokeless tobacco: Never Used  Substance and Sexual Activity  . Alcohol use: No    Alcohol/week: 0.0 oz  . Drug use: No  . Sexual activity: No    Comment: lives alone, widowed in 2006  Other Topics Concern  . None  Social History Narrative  . None     Family History: The patient's family history includes Alzheimer's disease in her father; Cancer in her mother; Diabetes in her father; Heart disease in her father and maternal grandfather; Hyperlipidemia in her father and mother; Hypertension in her father and mother; Leukemia in her mother; Multiple sclerosis in her son. ROS:   Please see the history of present illness.    All 14 point review of systems negative except as described per history of present illness.  EKGs/Labs/Other Studies Reviewed:    The following studies were reviewed today   EKG:  EKG is  ordered today.  The ekg ordered today demonstrates normal sinus rhythm normal PR interval normal QRS complex duration morphology except for poor R wave progression through precordium, nonspecific ST segment changes  Recent Labs: 08/07/2016: TSH 1.42 11/14/2016: ALT 34; BUN 15; Creatinine, Ser 0.98; Hemoglobin 14.1; Platelets 125.0; Potassium 4.1; Pro B Natriuretic peptide (BNP) 88.0; Sodium 136  Recent Lipid Panel    Component Value Date/Time     CHOL 149 08/07/2016 1024   TRIG 72.0 08/07/2016 1024   HDL 63.00 08/07/2016 1024   CHOLHDL 2 08/07/2016 1024   VLDL 14.4 08/07/2016 1024   LDLCALC 72 08/07/2016 1024    Physical Exam:    VS:  BP (!) 148/92   Pulse 60   Resp 10   Ht _0  (1.549 m)   Wt 271 lb (122.9 kg)   LMP 01/29/1994   BMI 51.21 kg/m     Wt Readings from Last 3 Encounters:  12/04/16 271 lb (122.9 kg)  11/22/16 274 lb 12.8 oz (124.6 kg)  11/14/16 273 lb 3.2 oz (123.9 kg)  GEN:  Well nourished, well developed in no acute distress HEENT: Normal NECK: No JVD; No carotid bruits LYMPHATICS: No lymphadenopathy CARDIAC: RRR, systolic ejection murmur grade 1/6 to 2/6 best heard at the right upper portion of the sternum, no rubs, no gallops RESPIRATORY:  Clear to auscultation without rales, wheezing or rhonchi  ABDOMEN: Soft, non-tender, non-distended MUSCULOSKELETAL:  No edema; No deformity  SKIN: Warm and dry NEUROLOGIC:  Alert and oriented x 3 PSYCHIATRIC:  Normal affect   ASSESSMENT:    1. Essential hypertension   2. Angina pectoris ([place])   3. Diabetes mellitus type 2 in obese [facility])    PLAN:    In order of problems listed above:  1. Typical angina pectoris: I had a long discussion with her about what to do with the situation and options are medical therapy versus stress testing versus cardiac catheterization.  In her clinical scenario however because of the typical nature of her symptoms as well as multiple risk factors for coronary artery disease and high risk for having coronary artery disease I think cardiac catheterization will be the way to go.  I explained procedure to her including all risk benefits as well as alternatives.  She agreed to proceed.  I will check her EKG today to see if I can put her on beta-blocker.  As she does have already nitroglycerin.  I advised her to avoid any extreme exertion.  She is already on aspirin which I will continue.  I will review her chart and see if I can  put her on statin.  She does have to be on high intensity statin.  Does have nitroglycerin she knows how to use it 2. Systolic murmur: Most likely related to mild obstruction in the left ventricular outflow tract.  She does have some features of hypertrophic obstructive cardiomyopathy namely a ASH on last echocardiogram, but there was no SAM, gradient across left ventricular outflow tract was only mild 15 mmHg mean.  Think we are at this stage that this problem can be managed medically.  Beta-blocker will be very beneficial to her.  I will put her on 25 mg metoprolol twice a day her EKG done today showed sinus rhythm poor R wave progression anterior precordium, normal PR interval, nonspecific ST segment changes. 3. Essential hypertension: Her blood pressure is elevated today hopefully addition of beta-blocker will help with that. 4. Diabetes mellitus: Followed by primary care physician apparently her hemoglobin A1c is getting better. 5. Morbid obesity: Obviously is a problem she understand that she is trying to do some exercises to lose weight but she does have a primary knee with complicated situation on top of that I told her now noted to exercise aggressively until we have situation more clear after cardiac catheterization. 6. Sleep apnea: She did have a sleep study done she was recommended mask however she could not use it.  This is something we will revisit later  Overall lady with multiple risk factors for coronary artery disease and fairly typical exertional angina pectoris.  She elected to proceed with cardiac catheterization which we will do   Medication Adjustments/Labs and Tests Ordered: Current medicines are reviewed at length with the patient today.  Concerns regarding medicines are outlined above.  No orders of the defined types were placed in this encounter.  No orders of the defined types were placed in this encounter.   Signed, Park Liter, MD, [facility] 12/04/2016 10:57 AM     [facility]

## 2016-12-04 NOTE — Telephone Encounter (Signed)
Chart reviewed.  Pt completed follow up CBC today per cardiology/

## 2016-12-04 NOTE — Patient Instructions (Addendum)
Medication Instructions:  Your physician recommends that you continue on your current medications as directed. Please refer to the Current Medication list given to you today.  Labwork: Your physician recommends that you return for lab work today for your procedure: Complete Blood Cell Count, Basic Metabolic panel, and PT/INR.  Testing/Procedures:   [facility] [address], [address] [place] [address] Dept: [phone] Loc: [phone]  Darlina SicilianMary L Gil  12/04/2016  You are scheduled for a Cardiac Catheterization on Thursday, November 29 with Dr. Calton DachMike Cooper.  1. [facility] at [facility]: [address] [place], [place] [address] at 11:30 a.m. (two hours before your procedure to ensure your preparation). Free valet parking service is available.   Special note: Every effort is made to have your procedure done on time. Please understand that emergencies sometimes delay scheduled procedures.  2. Diet: Do not eat or drink anything after midnight prior to your procedure except sips of water to take medications.  3. Labs: Will be drawn today in office.   4. Medication instructions in preparation for your procedure:   STOP taking Lasix, Glimepiride, Keflex the morning of your procedure on Thursday, December 26, 2016.   Take only 22 units of insulin the night before your procedure. Do not take any insulin on the day of the procedure.  On the morning of your procedure, take your Aspirin and any morning medicines NOT listed above.  You may use sips of water.  5. Plan for one night stay--bring personal belongings. 6. Bring a current list of your medications and current insurance cards. 7. You MUST have a responsible person to drive you home. 8. Someone MUST be with you the first 24 hours after you arrive home or your discharge will be delayed. 9. Please  wear clothes that are easy to get on and off and wear slip-on shoes.  Thank you for allowing us to care for you!   -- [facility]  Follow-Up: Your physician recommends that you schedule a follow-up appointment in: 7-10 days after your procedure.   Any Other Special Instructions Will Be Listed Below (If Applicable).  Please note that any paperwork needing to be filled out by the provider will need to be addressed at the front desk prior to seeing the provider. Please note that any paperwork FMLA, Disability or other documents regarding health condition is subject to a $25.00 charge that must be received prior to completion of paperwork in the form of a money order or check.    If you need a refill on your cardiac medications before your next appointment, please call your pharmacy.

## 2016-12-05 LAB — CBC WITH DIFFERENTIAL/PLATELET
BASOS ABS: 0.1 10*3/uL (ref 0.0–0.2)
Basos: 1 %
EOS (ABSOLUTE): 0.2 10*3/uL (ref 0.0–0.4)
Eos: 3 %
HEMOGLOBIN: 13.5 g/dL (ref 11.1–15.9)
Hematocrit: 39.6 % (ref 34.0–46.6)
IMMATURE GRANS (ABS): 0 10*3/uL (ref 0.0–0.1)
Immature Granulocytes: 0 %
LYMPHS: 16 %
Lymphocytes Absolute: 1.2 10*3/uL (ref 0.7–3.1)
MCH: 29.2 pg (ref 26.6–33.0)
MCHC: 34.1 g/dL (ref 31.5–35.7)
MCV: 86 fL (ref 79–97)
MONOCYTES: 5 %
Monocytes Absolute: 0.4 10*3/uL (ref 0.1–0.9)
NEUTROS ABS: 5.9 10*3/uL (ref 1.4–7.0)
NEUTROS PCT: 75 %
PLATELETS: 155 10*3/uL (ref 150–379)
RBC: 4.63 x10E6/uL (ref 3.77–5.28)
RDW: 14.7 % (ref 12.3–15.4)
WBC: 7.7 10*3/uL (ref 3.4–10.8)

## 2016-12-05 LAB — BASIC METABOLIC PANEL
BUN/Creatinine Ratio: 18 (ref 12–28)
BUN: 13 mg/dL (ref 8–27)
CO2: 24 mmol/L (ref 20–29)
CREATININE: 0.73 mg/dL (ref 0.57–1.00)
Calcium: 9.1 mg/dL (ref 8.7–10.3)
Chloride: 102 mmol/L (ref 96–106)
GFR, EST AFRICAN AMERICAN: 104 mL/min/{1.73_m2} (ref >59)
GFR, EST NON AFRICAN AMERICAN: 90 mL/min/{1.73_m2} (ref >59)
Glucose: 154 mg/dL — ABNORMAL HIGH (ref 65–99)
Potassium: 3.8 mmol/L (ref 3.5–5.2)
Sodium: 140 mmol/L (ref 134–144)

## 2016-12-05 LAB — PROTIME-INR
INR: 1.1 (ref 0.8–1.2)
Prothrombin Time: 11 s (ref 9.1–12.0)

## 2016-12-06 ENCOUNTER — Ambulatory Visit: Payer: No Typology Code available for payment source | Admitting: Podiatry

## 2016-12-06 ENCOUNTER — Encounter: Payer: Self-pay | Admitting: Podiatry

## 2016-12-06 ENCOUNTER — Ambulatory Visit (INDEPENDENT_AMBULATORY_CARE_PROVIDER_SITE_OTHER): Payer: No Typology Code available for payment source

## 2016-12-06 VITALS — BP 187/99 | HR 68

## 2016-12-06 DIAGNOSIS — M2041 Other hammer toe(s) (acquired), right foot: Secondary | ICD-10-CM

## 2016-12-06 DIAGNOSIS — M2042 Other hammer toe(s) (acquired), left foot: Secondary | ICD-10-CM

## 2016-12-06 DIAGNOSIS — L600 Ingrowing nail: Secondary | ICD-10-CM

## 2016-12-06 MED ORDER — NEOMYCIN-POLYMYXIN-HC 1 % OT SOLN: OTIC | 1 refills | Status: DC

## 2016-12-06 NOTE — Progress Notes (Signed)
She presents today with a chief complaint of ingrown toenail to the hallux right. She states that she has a history of diabetes and her hemoglobin A1c is 7.8.  Objective: Vital signs are stable she is alert and oriented 3 and have reviewed her past medical history medications allergy surgeries and social history. Pulses remain palpable neurologic sensorium is intact deep tendon reflexes are intact muscle strength is normal symmetrical bilateral. Cutaneous evaluation demonstrates a well-hydrated uterus with a short radial nail margin to the tibial border of the hallux right. There is gross granulation tissue no purulence no malodor.  Assessment: Diabetes mellitus with ingrown nail tibial border hallux right.  Plan: Chemical mucosectomy was performed today. She was provided with both oral and written home going instructions for care and soaking of her toe as well as a prescription for Court export noted to be applied twice daily after soaking. I'll follow up with her in 1-2 weeks to make sure that she is healing well. Should she have any questions or concerns regarding her or any further symptoms she will notify S immediately.

## 2016-12-06 NOTE — Patient Instructions (Signed)

## 2016-12-10 ENCOUNTER — Encounter (HOSPITAL_COMMUNITY): Admission: RE | Payer: Self-pay | Source: Ambulatory Visit

## 2016-12-10 ENCOUNTER — Ambulatory Visit (HOSPITAL_COMMUNITY)
Admission: RE | Admit: 2016-12-10 | Payer: No Typology Code available for payment source | Source: Ambulatory Visit | Admitting: Cardiovascular Disease

## 2016-12-10 SURGERY — LEFT HEART CATH AND CORONARY ANGIOGRAPHY
Anesthesia: LOCAL

## 2016-12-13 ENCOUNTER — Telehealth: Payer: Self-pay | Admitting: Cardiology

## 2016-12-13 NOTE — Telephone Encounter (Signed)
Returning call.

## 2016-12-14 NOTE — Telephone Encounter (Signed)
Lmtrc

## 2016-12-18 ENCOUNTER — Ambulatory Visit (INDEPENDENT_AMBULATORY_CARE_PROVIDER_SITE_OTHER): Payer: No Typology Code available for payment source | Admitting: Obstetrics and Gynecology

## 2016-12-18 ENCOUNTER — Encounter: Payer: Self-pay | Admitting: Obstetrics and Gynecology

## 2016-12-18 VITALS — BP 170/101 | HR 82 | Ht 61.0 in | Wt 270.0 lb

## 2016-12-18 DIAGNOSIS — Z01419 Encounter for gynecological examination (general) (routine) without abnormal findings: Secondary | ICD-10-CM

## 2016-12-18 DIAGNOSIS — Z1151 Encounter for screening for human papillomavirus (HPV): Secondary | ICD-10-CM | POA: Diagnosis not present

## 2016-12-18 DIAGNOSIS — Z124 Encounter for screening for malignant neoplasm of cervix: Secondary | ICD-10-CM

## 2016-12-18 MED ORDER — NYSTATIN-TRIAMCINOLONE 100000-0.1 UNIT/GM-% EX OINT: 1.0000 "application " | TOPICAL_OINTMENT | Freq: Two times a day (BID) | CUTANEOUS | 0 refills | Status: DC

## 2016-12-18 MED ORDER — FLUCONAZOLE 150 MG PO TABS: 150.0000 mg | ORAL_TABLET | Freq: Once | ORAL | 6 refills | Status: AC

## 2016-12-18 NOTE — Progress Notes (Signed)
Pt here for yearly exam but, c/o yeast infection symptoms that come and go (not currently having this issue). Last pap was about 15 years ago. Will check her BP again before she leaves.

## 2016-12-18 NOTE — Progress Notes (Signed)
Subjective:     Stephanie Cordova is a 61 y.o. female G2P2 with BMI 3751 who is here for a comprehensive physical exam. The patient reports no problems. She is not sexually active. She has been menopausal for over 10 years and denies any vaginal bleeding. She denies any pelvic pain or abnormal discharge. She reports occasional vulva pruritis which is treated with diflucan.   Past Medical History:  Diagnosis Date  . Abnormal liver function tests 08/07/2016  . Arthritis    b/l knees, bone on bone  . Cirrhosis of liver (HCC) 12/21/2012   Confirmed via ultrasound per patient, she reports neg acute hepatitis panel   . Cold sore 11/04/2016  . Diabetes mellitus type 2 in obese (HCC) 12/21/2012   takes Amaryl daily  . History of bronchitis 03/2015  . History of colon polyps    benign  . Hypertension    takes Metoprolol and Lisinopril daily  . Insomnia   . Pneumonia    hx of > 5 yrs ago  . Pulmonary nodule 08/07/2016  . Sinusitis, acute 01/26/2016  . Vaginitis 10/30/2016  . Vitamin D deficiency 04/24/2016   Past Surgical History:  Procedure Laterality Date  . ABDOMINAL HYSTERECTOMY  1996  . BREAST BIOPSY    . CHOLECYSTECTOMY N/A 09/06/2015   Procedure: LAPAROSCOPIC CHOLECYSTECTOMY;  Surgeon: Axel FillerArmando Ramirez, MD;  Location: [facility];  Service: General;  Laterality: N/A;  . COLONOSCOPY WITH ESOPHAGOGASTRODUODENOSCOPY (EGD)    . KNEE ARTHROSCOPY Bilateral   . TONSILLECTOMY    . TUBAL LIGATION     Family History  Problem Relation Age of Onset  . Hypertension Mother   . Hyperlipidemia Mother   . Leukemia Mother   . Cancer Mother   . Hypertension Father   . Hyperlipidemia Father   . Diabetes Father   . Heart disease Father   . Alzheimer's disease Father   . Multiple sclerosis Son   . Heart disease Maternal Grandfather     Social History   Socioeconomic History  . Marital status: Widowed    Spouse name: Not on file  . Number of children: Not on file  . Years of education: Not on file  .  Highest education level: Not on file  Social Needs  . Financial resource strain: Not on file  . Food insecurity - worry: Not on file  . Food insecurity - inability: Not on file  . Transportation needs - medical: Not on file  . Transportation needs - non-medical: Not on file  Occupational History  . Occupation: Clinical biochemistCustomer service  Tobacco Use  . Smoking status: Never Smoker  . Smokeless tobacco: Never Used  Substance and Sexual Activity  . Alcohol use: No    Alcohol/week: 0.0 oz  . Drug use: No  . Sexual activity: No    Comment: lives alone, widowed in 2006  Other Topics Concern  . Not on file  Social History Narrative  . Not on file   Health Maintenance  Topic Date Due  . Hepatitis C Screening  04/18/1955  . HIV Screening  04/01/1971  . PAP SMEAR  04/24/2017 (Originally 01/29/2013)  . PNEUMOCOCCAL POLYSACCHARIDE VACCINE (2) 12/29/2016  . OPHTHALMOLOGY EXAM  12/29/2016  . HEMOGLOBIN A1C  02/07/2017  . FOOT EXAM  04/24/2017  . MAMMOGRAM  06/06/2017  . TETANUS/TDAP  01/30/2019  . COLONOSCOPY  11/27/2022  . INFLUENZA VACCINE  Completed       Review of Systems Pertinent items are noted in HPI.   Objective:  Blood pressure (!) 170/101, pulse 82, height 5\' 1"  (1.549 m), weight 270 lb (122.5 kg), last menstrual period 01/29/1994.     GENERAL: Well-developed, well-nourished female in no acute distress.  HEENT: Normocephalic, atraumatic. Sclerae anicteric.  NECK: Supple. Normal thyroid.  LUNGS: Clear to auscultation bilaterally.  HEART: Regular rate and rhythm. BREASTS: Symmetric in size. No palpable masses or lymphadenopathy, skin changes, or nipple drainage. ABDOMEN: Soft, nontender, nondistended. Obese PELVIC: Normal external female genitalia. Vagina is pink and rugated.  Normal discharge. Normal appearing cervix. Bimanual exam limited secondary to body habitus EXTREMITIES: No cyanosis, clubbing, or edema, 2+ distal pulses.    Assessment:    Healthy female exam.       Plan:    pap smear collected Screening mammogram  Patient advised to perform a monthly self breast and vulva exam Patient will be contacted with abnormal results Patient with HTN and reports taking medication as prescribed. She is scheduled for cardiac cath RTC prn See After Visit Summary for Counseling Recommendations

## 2016-12-19 ENCOUNTER — Ambulatory Visit: Payer: No Typology Code available for payment source | Admitting: Cardiology

## 2016-12-25 ENCOUNTER — Ambulatory Visit: Payer: No Typology Code available for payment source | Admitting: Podiatry

## 2016-12-25 ENCOUNTER — Telehealth: Payer: Self-pay

## 2016-12-25 DIAGNOSIS — Z01812 Encounter for preprocedural laboratory examination: Secondary | ICD-10-CM

## 2016-12-25 LAB — CYTOLOGY - PAP
Bacterial vaginitis: NEGATIVE
CANDIDA VAGINITIS: NEGATIVE
Diagnosis: NEGATIVE
HPV (WINDOPATH): NOT DETECTED

## 2016-12-25 NOTE — Telephone Encounter (Signed)
[facility] scheduled for:  12/27/2016 @ 1330 Verified arrival time and place:  NT @ 1130 Confirmed AM meds to be taken pre-cath with sip of water: Take ASA Hold lasix/amaryl/insulin Confirmed patient has responsible person to drive home post procedure and observe patient for 24 hours:  yes Addl concerns:  none

## 2016-12-25 NOTE — Telephone Encounter (Signed)
S/w pt and discussed rescheduling her heart cath. Her new date is 12/27/16 with Dr. Excell Seltzerooper. Pt is aware of her date and time. I advised of her instructions once again and reprinted her previous instructions for her to come by the office to get today. New orders placed for labs as well today as they were not collected at the last visit due to insurance complications.

## 2016-12-26 LAB — CBC WITH DIFFERENTIAL
BASOS: 1 %
Basophils Absolute: 0.1 10*3/uL (ref 0.0–0.2)
EOS (ABSOLUTE): 0.2 10*3/uL (ref 0.0–0.4)
EOS: 2 %
HEMATOCRIT: 42 % (ref 34.0–46.6)
Hemoglobin: 13.9 g/dL (ref 11.1–15.9)
Immature Grans (Abs): 0 10*3/uL (ref 0.0–0.1)
Immature Granulocytes: 0 %
LYMPHS ABS: 1.1 10*3/uL (ref 0.7–3.1)
Lymphs: 13 %
MCH: 29.5 pg (ref 26.6–33.0)
MCHC: 33.1 g/dL (ref 31.5–35.7)
MCV: 89 fL (ref 79–97)
MONOS ABS: 0.5 10*3/uL (ref 0.1–0.9)
Monocytes: 6 %
NEUTROS ABS: 6.6 10*3/uL (ref 1.4–7.0)
Neutrophils: 78 %
RBC: 4.71 x10E6/uL (ref 3.77–5.28)
RDW: 14.1 % (ref 12.3–15.4)
WBC: 8.4 10*3/uL (ref 3.4–10.8)

## 2016-12-26 LAB — BASIC METABOLIC PANEL
BUN / CREAT RATIO: 16 (ref 12–28)
BUN: 12 mg/dL (ref 8–27)
CALCIUM: 9.4 mg/dL (ref 8.7–10.3)
CO2: 22 mmol/L (ref 20–29)
Chloride: 103 mmol/L (ref 96–106)
Creatinine, Ser: 0.76 mg/dL (ref 0.57–1.00)
GFR, EST AFRICAN AMERICAN: 99 mL/min/{1.73_m2} (ref >59)
GFR, EST NON AFRICAN AMERICAN: 86 mL/min/{1.73_m2} (ref >59)
Glucose: 147 mg/dL — ABNORMAL HIGH (ref 65–99)
POTASSIUM: 3.7 mmol/L (ref 3.5–5.2)
Sodium: 141 mmol/L (ref 134–144)

## 2016-12-26 LAB — PROTIME-INR
INR: 1.1 (ref 0.8–1.2)
Prothrombin Time: 11.1 s (ref 9.1–12.0)

## 2016-12-27 ENCOUNTER — Ambulatory Visit (HOSPITAL_COMMUNITY)
Admission: RE | Admit: 2016-12-27 | Discharge: 2016-12-27 | Disposition: A | Discharge status: Home / Self Care | Payer: PRIVATE HEALTH INSURANCE | Source: Ambulatory Visit | Attending: Cardiovascular Disease | Admitting: Cardiovascular Disease

## 2016-12-27 ENCOUNTER — Encounter (HOSPITAL_COMMUNITY)
Admission: RE | Disposition: A | Discharge status: Home / Self Care | Payer: Self-pay | Source: Ambulatory Visit | Attending: Cardiovascular Disease

## 2016-12-27 DIAGNOSIS — Z8249 Family history of ischemic heart disease and other diseases of the circulatory system: Secondary | ICD-10-CM | POA: Insufficient documentation

## 2016-12-27 DIAGNOSIS — Z882 Allergy status to sulfonamides status: Secondary | ICD-10-CM | POA: Insufficient documentation

## 2016-12-27 DIAGNOSIS — Z6841 Body Mass Index (BMI) 40.0 and over, adult: Secondary | ICD-10-CM | POA: Diagnosis not present

## 2016-12-27 DIAGNOSIS — R079 Chest pain, unspecified: Secondary | ICD-10-CM | POA: Diagnosis present

## 2016-12-27 DIAGNOSIS — Z7982 Long term (current) use of aspirin: Secondary | ICD-10-CM | POA: Insufficient documentation

## 2016-12-27 DIAGNOSIS — I10 Essential (primary) hypertension: Secondary | ICD-10-CM | POA: Diagnosis not present

## 2016-12-27 DIAGNOSIS — R0789 Other chest pain: Secondary | ICD-10-CM | POA: Insufficient documentation

## 2016-12-27 DIAGNOSIS — E785 Hyperlipidemia, unspecified: Secondary | ICD-10-CM | POA: Insufficient documentation

## 2016-12-27 DIAGNOSIS — I421 Obstructive hypertrophic cardiomyopathy: Secondary | ICD-10-CM | POA: Diagnosis not present

## 2016-12-27 DIAGNOSIS — G4700 Insomnia, unspecified: Secondary | ICD-10-CM | POA: Diagnosis not present

## 2016-12-27 DIAGNOSIS — K7460 Unspecified cirrhosis of liver: Secondary | ICD-10-CM | POA: Insufficient documentation

## 2016-12-27 DIAGNOSIS — R0602 Shortness of breath: Secondary | ICD-10-CM | POA: Diagnosis present

## 2016-12-27 DIAGNOSIS — Z7984 Long term (current) use of oral hypoglycemic drugs: Secondary | ICD-10-CM | POA: Insufficient documentation

## 2016-12-27 DIAGNOSIS — G4730 Sleep apnea, unspecified: Secondary | ICD-10-CM | POA: Diagnosis not present

## 2016-12-27 DIAGNOSIS — E119 Type 2 diabetes mellitus without complications: Secondary | ICD-10-CM | POA: Insufficient documentation

## 2016-12-27 DIAGNOSIS — M170 Bilateral primary osteoarthritis of knee: Secondary | ICD-10-CM | POA: Diagnosis not present

## 2016-12-27 DIAGNOSIS — E559 Vitamin D deficiency, unspecified: Secondary | ICD-10-CM | POA: Insufficient documentation

## 2016-12-27 HISTORY — PX: LEFT HEART CATH AND CORONARY ANGIOGRAPHY: CATH118249

## 2016-12-27 LAB — GLUCOSE, CAPILLARY
GLUCOSE-CAPILLARY: 107 mg/dL — AB (ref 65–99)
Glucose-Capillary: 143 mg/dL — ABNORMAL HIGH (ref 65–99)

## 2016-12-27 SURGERY — LEFT HEART CATH AND CORONARY ANGIOGRAPHY
Anesthesia: LOCAL

## 2016-12-27 MED ORDER — FENTANYL CITRATE (PF) 100 MCG/2ML IJ SOLN
INTRAMUSCULAR | Status: DC | PRN
  Administered 2016-12-27 (×2): 25 ug via INTRAVENOUS

## 2016-12-27 MED ORDER — SODIUM CHLORIDE 0.9 % IV SOLN: 250.0000 mL | INTRAVENOUS | Status: DC | PRN

## 2016-12-27 MED ORDER — SODIUM CHLORIDE 0.9% FLUSH: 3.0000 mL | Freq: Two times a day (BID) | INTRAVENOUS | Status: DC

## 2016-12-27 MED ORDER — SODIUM CHLORIDE 0.9 % IV SOLN: INTRAVENOUS | Status: DC

## 2016-12-27 MED ORDER — LIDOCAINE HCL (PF) 1 % IJ SOLN
INTRAMUSCULAR | Status: DC | PRN
  Administered 2016-12-27: 2 mL via SUBCUTANEOUS

## 2016-12-27 MED ORDER — HEPARIN SODIUM (PORCINE) 1000 UNIT/ML IJ SOLN
INTRAMUSCULAR | Status: DC | PRN
  Administered 2016-12-27: 6000 [IU] via INTRAVENOUS

## 2016-12-27 MED ORDER — VERAPAMIL HCL 2.5 MG/ML IV SOLN
INTRAVENOUS | Status: AC
  Filled 2016-12-27: qty 2

## 2016-12-27 MED ORDER — HEPARIN (PORCINE) IN NACL 2-0.9 UNIT/ML-% IJ SOLN
INTRAMUSCULAR | Status: AC | PRN
  Administered 2016-12-27: 1500 mL

## 2016-12-27 MED ORDER — SODIUM CHLORIDE 0.9 % WEIGHT BASED INFUSION: 1.0000 mL/kg/h | INTRAVENOUS | Status: DC

## 2016-12-27 MED ORDER — MIDAZOLAM HCL 2 MG/2ML IJ SOLN
INTRAMUSCULAR | Status: DC | PRN
  Administered 2016-12-27 (×2): 2 mg via INTRAVENOUS

## 2016-12-27 MED ORDER — HEPARIN (PORCINE) IN NACL 2-0.9 UNIT/ML-% IJ SOLN
INTRAMUSCULAR | Status: AC
  Filled 2016-12-27: qty 1000

## 2016-12-27 MED ORDER — IOPAMIDOL (ISOVUE-370) INJECTION 76%
INTRAVENOUS | Status: AC
  Filled 2016-12-27: qty 100

## 2016-12-27 MED ORDER — ASPIRIN 81 MG PO CHEW: 81.0000 mg | CHEWABLE_TABLET | ORAL | Status: DC

## 2016-12-27 MED ORDER — SODIUM CHLORIDE 0.9% FLUSH: 3.0000 mL | INTRAVENOUS | Status: DC | PRN

## 2016-12-27 MED ORDER — VERAPAMIL HCL 2.5 MG/ML IV SOLN
INTRAVENOUS | Status: DC | PRN
  Administered 2016-12-27 (×2): 10 mL via INTRA_ARTERIAL

## 2016-12-27 MED ORDER — IOPAMIDOL (ISOVUE-370) INJECTION 76%
INTRAVENOUS | Status: DC | PRN
  Administered 2016-12-27: 75 mL via INTRA_ARTERIAL

## 2016-12-27 MED ORDER — HEPARIN SODIUM (PORCINE) 1000 UNIT/ML IJ SOLN
INTRAMUSCULAR | Status: AC
  Filled 2016-12-27: qty 1

## 2016-12-27 MED ORDER — SODIUM CHLORIDE 0.9 % WEIGHT BASED INFUSION
3.0000 mL/kg/h | INTRAVENOUS | Status: AC
  Administered 2016-12-27: 3 mL/kg/h via INTRAVENOUS

## 2016-12-27 MED ORDER — MIDAZOLAM HCL 2 MG/2ML IJ SOLN
INTRAMUSCULAR | Status: AC
  Filled 2016-12-27: qty 2

## 2016-12-27 MED ORDER — LIDOCAINE HCL (PF) 1 % IJ SOLN
INTRAMUSCULAR | Status: AC
  Filled 2016-12-27: qty 30

## 2016-12-27 MED ORDER — FENTANYL CITRATE (PF) 100 MCG/2ML IJ SOLN
INTRAMUSCULAR | Status: AC
  Filled 2016-12-27: qty 2

## 2016-12-27 SURGICAL SUPPLY — 13 items
CATH 5FR JL3.5 JR4 ANG PIG MP (CATHETERS) ×2 IMPLANT
COVER PRB 48X5XTLSCP FOLD TPE (BAG) ×1 IMPLANT
COVER PROBE 5X48 (BAG) ×2
DEVICE RAD COMP TR BAND LRG (VASCULAR PRODUCTS) ×2 IMPLANT
GLIDESHEATH SLEND SS 6F .021 (SHEATH) ×2 IMPLANT
GUIDEWIRE INQWIRE 1.5J.035X260 (WIRE) ×1 IMPLANT
INQWIRE 1.5J .035X260CM (WIRE) ×2
KIT HEART LEFT (KITS) ×2 IMPLANT
PACK CARDIAC CATHETERIZATION (CUSTOM PROCEDURE TRAY) ×2 IMPLANT
SYR MEDRAD MARK V 150ML (SYRINGE) ×2 IMPLANT
TRANSDUCER W/STOPCOCK (MISCELLANEOUS) ×2 IMPLANT
TUBING CIL FLEX 10 FLL-RA (TUBING) ×2 IMPLANT
WIRE HI TORQ VERSACORE-J 145CM (WIRE) ×2 IMPLANT

## 2016-12-27 NOTE — Interval H&P Note (Signed)
History and Physical Interval Note:  12/27/2016 1:35 PM  Stephanie Cordova  has presented today for surgery, with the diagnosis of Chest Pain  The various methods of treatment have been discussed with the patient and family. After consideration of risks, benefits and other options for treatment, the patient has consented to  Procedure(s): LEFT HEART CATH AND CORONARY ANGIOGRAPHY (N/A) as a surgical intervention .  The patient's history has been reviewed, patient examined, no change in status, stable for surgery.  I have reviewed the patient's chart and labs.  Questions were answered to the patient's satisfaction.     Tonny BollmanMichael Dink Creps

## 2016-12-27 NOTE — Discharge Instructions (Signed)

## 2016-12-28 ENCOUNTER — Encounter (HOSPITAL_COMMUNITY): Payer: Self-pay | Admitting: Cardiovascular Disease

## 2017-01-10 ENCOUNTER — Ambulatory Visit (INDEPENDENT_AMBULATORY_CARE_PROVIDER_SITE_OTHER): Payer: No Typology Code available for payment source | Admitting: Cardiology

## 2017-01-10 ENCOUNTER — Other Ambulatory Visit: Payer: Self-pay | Admitting: Endocrinology

## 2017-01-10 DIAGNOSIS — I10 Essential (primary) hypertension: Secondary | ICD-10-CM | POA: Diagnosis not present

## 2017-01-10 DIAGNOSIS — I209 Angina pectoris, unspecified: Secondary | ICD-10-CM

## 2017-01-10 NOTE — Progress Notes (Signed)
Patient was seen today due to soreness, bruising and lump from the are of the heart catheterization. Regular moderate pulse was noted present to the right radial. Per Dr. Bing MatterKrasowski patient is to apply warm compress to the area use tylenol prn.

## 2017-01-28 ENCOUNTER — Other Ambulatory Visit: Payer: Self-pay | Admitting: Endocrinology

## 2017-01-28 NOTE — Telephone Encounter (Signed)
Please refill x 1 Ov is due  

## 2017-02-06 ENCOUNTER — Other Ambulatory Visit: Payer: Self-pay

## 2017-02-06 MED ORDER — VITAMIN D (ERGOCALCIFEROL) 1.25 MG (50000 UNIT) PO CAPS: 50000.0000 [IU] | ORAL_CAPSULE | ORAL | 0 refills | Status: DC

## 2017-02-07 ENCOUNTER — Ambulatory Visit: Payer: BLUE CROSS/BLUE SHIELD | Admitting: Cardiology

## 2017-02-07 ENCOUNTER — Encounter: Payer: Self-pay | Admitting: Cardiology

## 2017-02-07 ENCOUNTER — Other Ambulatory Visit: Payer: Self-pay

## 2017-02-07 VITALS — BP 168/98 | HR 93 | Ht 62.0 in | Wt 275.1 lb

## 2017-02-07 DIAGNOSIS — I209 Angina pectoris, unspecified: Secondary | ICD-10-CM | POA: Diagnosis not present

## 2017-02-07 DIAGNOSIS — I421 Obstructive hypertrophic cardiomyopathy: Secondary | ICD-10-CM

## 2017-02-07 DIAGNOSIS — R0602 Shortness of breath: Secondary | ICD-10-CM

## 2017-02-07 DIAGNOSIS — I10 Essential (primary) hypertension: Secondary | ICD-10-CM | POA: Diagnosis not present

## 2017-02-07 DIAGNOSIS — E782 Mixed hyperlipidemia: Secondary | ICD-10-CM

## 2017-02-07 MED ORDER — AMLODIPINE BESYLATE 5 MG PO TABS: 5.0000 mg | ORAL_TABLET | Freq: Every day | ORAL | 3 refills | Status: DC

## 2017-02-07 NOTE — Progress Notes (Signed)
Cardiology Office Note:    Date:  02/07/2017   ID:  Stephanie Cordova, DOB 26-Jul-1955, MRN [id number]  PCP:  Stephanie Lukes, MD  Cardiologist:  Stephanie Campus, MD    Referring MD: Stephanie Lukes, MD   Chief Complaint  Patient presents with  . Hypertension  After cardiac catheterization  History of Present Illness:    Stephanie Cordova is a 62 y.o. female with multiple risk factors for coronary artery disease.  She started experiencing exertional chest pain he decided to proceed with cardiac catheterization.  Cardiac catheterization showed normal coronaries.  She is doing well denies have any chest pain since that time.  She does have mild degree of gradient across the left ventricle outflow tract with some features of hypertrophic obstructive cardia myopathy however at this moment the key will be to control her blood pressure.  Past Medical History:  Diagnosis Date  . Abnormal liver function tests 08/07/2016  . Arthritis    b/l knees, bone on bone  . Cirrhosis of liver (Stephanie Cordova) 12/21/2012   Confirmed via ultrasound per patient, she reports neg acute hepatitis panel   . Cold sore 11/04/2016  . Diabetes mellitus type 2 in obese (Stephanie Cordova) 12/21/2012   takes Amaryl daily  . History of bronchitis 03/2015  . History of colon polyps    benign  . Hypertension    takes Metoprolol and Lisinopril daily  . Insomnia   . Pneumonia    hx of > 5 yrs ago  . Pulmonary nodule 08/07/2016  . Sinusitis, acute 01/26/2016  . Vaginitis 10/30/2016  . Vitamin D deficiency 04/24/2016    Past Surgical History:  Procedure Laterality Date  . ABDOMINAL HYSTERECTOMY  1996  . BREAST BIOPSY    . CHOLECYSTECTOMY N/A 09/06/2015   Procedure: LAPAROSCOPIC CHOLECYSTECTOMY;  Surgeon: Stephanie Ok, MD;  Location: [place];  Service: General;  Laterality: N/A;  . COLONOSCOPY WITH ESOPHAGOGASTRODUODENOSCOPY (EGD)    . KNEE ARTHROSCOPY Bilateral   . LEFT HEART CATH AND CORONARY ANGIOGRAPHY N/A 12/27/2016   Procedure: LEFT HEART  CATH AND CORONARY ANGIOGRAPHY;  Surgeon: Stephanie Mocha, MD;  Location: [place] [facility];  Service: Cardiovascular;  Laterality: N/A;  . TONSILLECTOMY    . TUBAL LIGATION      Current Medications: Current Meds  Medication Sig  . aspirin EC 81 MG tablet Take 1 tablet (81 mg total) by mouth daily.  . B-D ULTRAFINE III SHORT PEN 31G X 8 MM MISC USE THREE TIMES DAILY  . Blood Glucose Monitoring Suppl (ONE TOUCH ULTRA SYSTEM KIT) w/Device KIT Use as directed twice daily to check blood sugar.  DX E11.9  . furosemide (LASIX) 20 MG tablet Take 1 tablet twice daily for 3 days, then decrease to 1 tab once daily (Patient taking differently: 40 mg. Take 1 tablet twice daily for 3 days, then decrease to 1 tab once daily)  . glimepiride (AMARYL) 4 MG tablet Take 1 tablet (4 mg total) by mouth 2 (two) times daily.  Marland Kitchen glucose blood (ONETOUCH VERIO) test strip 1 each by Other route 2 (two) times daily. And lancets 2/day  . HUMALOG KWIKPEN 100 UNIT/ML KiwkPen INJECT 15 UNITS UNDER THE SKIN THREE TIMES DAILY WITH FOOD  . lisinopril (PRINIVIL,ZESTRIL) 20 MG tablet Take 1 tablet (20 mg total) by mouth 2 (two) times daily.  . metoprolol tartrate (LOPRESSOR) 25 MG tablet Take 1 tablet (25 mg total) 2 (two) times daily by mouth. (Patient taking differently: Take 50 mg 2 (two)  times daily by mouth. )  . NEOMYCIN-POLYMYXIN-HYDROCORTISONE (CORTISPORIN) 1 % SOLN OTIC solution Apply 1-2 drops to toe BID after soaking  . nystatin-triamcinolone ointment (MYCOLOG) Apply 1 application topically 2 (two) times daily.  . ONE TOUCH ULTRA TEST test strip USE AS DIRECTED TWICE A DAY  . ONETOUCH DELICA LANCETS 94T MISC Use as directed twice daily to check blood sugar.  DX E11.9  . rosuvastatin (CRESTOR) 10 MG tablet Take 1 tablet (10 mg total) daily by mouth.  . Vitamin D, Ergocalciferol, (DRISDOL) 50000 units CAPS capsule Take 1 capsule (50,000 Units total) by mouth every 7 (seven) days.     Allergies:   Losartan; Lexapro  [escitalopram]; and Sulfa antibiotics   Social History   Socioeconomic History  . Marital status: Widowed    Spouse name: None  . Number of children: None  . Years of education: None  . Highest education level: None  Social Needs  . Financial resource strain: None  . Food insecurity - worry: None  . Food insecurity - inability: None  . Transportation needs - medical: None  . Transportation needs - non-medical: None  Occupational History  . Occupation: Therapist, art  Tobacco Use  . Smoking status: Never Smoker  . Smokeless tobacco: Never Used  Substance and Sexual Activity  . Alcohol use: No    Alcohol/week: 0.0 oz  . Drug use: No  . Sexual activity: No    Comment: lives alone, widowed in 2006  Other Topics Concern  . None  Social History Narrative  . None     Family History: The patient's family history includes Alzheimer's disease in her father; Cancer in her mother; Diabetes in her father; Heart disease in her father and maternal grandfather; Hyperlipidemia in her father and mother; Hypertension in her father and mother; Leukemia in her mother; Multiple sclerosis in her son. ROS:   Please see the history of present illness.    All 14 point review of systems negative except as described per history of present illness  EKGs/Labs/Other Studies Reviewed:      Recent Labs: 08/07/2016: TSH 1.42 11/14/2016: ALT 34; Pro B Natriuretic peptide (BNP) 88.0 12/04/2016: Platelets 155 12/25/2016: BUN 12; Creatinine, Ser 0.76; Hemoglobin 13.9; Potassium 3.7; Sodium 141  Recent Lipid Panel    Component Value Date/Time   CHOL 149 08/07/2016 1024   TRIG 72.0 08/07/2016 1024   HDL 63.00 08/07/2016 1024   CHOLHDL 2 08/07/2016 1024   VLDL 14.4 08/07/2016 1024   LDLCALC 72 08/07/2016 1024    Physical Exam:    VS:  BP (!) 168/98 (BP Location: Left Arm, Patient Position: Sitting, Cuff Size: Large)   Pulse 93   Ht 5' 2"  (1.575 m)   Wt 275 lb 1.3 oz (124.8 kg)   LMP  01/29/1994   SpO2 98%   BMI 50.31 kg/m     Wt Readings from Last 3 Encounters:  02/07/17 275 lb 1.3 oz (124.8 kg)  12/27/16 262 lb (118.8 kg)  12/18/16 270 lb (122.5 kg)     GEN:  Well nourished, well developed in no acute distress HEENT: Normal NECK: No JVD; No carotid bruits LYMPHATICS: No lymphadenopathy CARDIAC: RRR, no murmurs, no rubs, no gallops RESPIRATORY:  Clear to auscultation without rales, wheezing or rhonchi  ABDOMEN: Soft, non-tender, non-distended MUSCULOSKELETAL:  No edema; No deformity  SKIN: Warm and dry LOWER EXTREMITIES: no swelling NEUROLOGIC:  Alert and oriented x 3 PSYCHIATRIC:  Normal affect   ASSESSMENT:    1. Angina  pectoris ([place])   2. Essential hypertension   3. Obstructive hypertrophic cardiomyopathy ([place])   4. Shortness of breath   5. Mixed hyperlipidemia    PLAN:    In order of problems listed above:  1. Essential hypertension: I will ask amlodipine 5 mg to her medical regiment and see her back in my office in about 1 month to see how her blood pressure is doing. 2. Obstructive hypertrophic cardiomyopathy.  Only very soft murmur today on the physical examination I asked her to stay well-hydrated I will try to reduce her blood pressure which will be priority.  And then that she may require some specific medication for management of this problem so far gradient is only mild. 3. Shortness of breath: Multifactorial.  She does have morbid obesity also some obstructive hypertrophic cardiomyopathy.  Will manage every single problem one by one. 4. Dyslipidemia we will continue with statin.   Medication Adjustments/Labs and Tests Ordered: Current medicines are reviewed at length with the patient today.  Concerns regarding medicines are outlined above.  No orders of the defined types were placed in this encounter.  Medication changes: No orders of the defined types were placed in this encounter.   Signed, Park Liter, MD, [facility] 02/07/2017  11:15 AM    [place]

## 2017-02-13 ENCOUNTER — Other Ambulatory Visit: Payer: Self-pay | Admitting: Family Medicine

## 2017-02-13 NOTE — Telephone Encounter (Signed)
Called pt to reschedule her appt for 03/05/17 and she requested an Rx refill for her diabetes test strips. Stated that its been a while since she had seen the other Dr. And wanted to know if we could refill them for her?  Please advise

## 2017-02-14 ENCOUNTER — Other Ambulatory Visit: Payer: Self-pay

## 2017-02-20 ENCOUNTER — Other Ambulatory Visit: Payer: Self-pay

## 2017-03-05 ENCOUNTER — Ambulatory Visit: Payer: No Typology Code available for payment source | Admitting: Family Medicine

## 2017-03-19 ENCOUNTER — Ambulatory Visit: Payer: BLUE CROSS/BLUE SHIELD | Admitting: Cardiology

## 2017-03-19 ENCOUNTER — Encounter: Payer: Self-pay | Admitting: Cardiology

## 2017-03-19 VITALS — BP 160/106 | HR 89 | Ht 62.0 in | Wt 273.0 lb

## 2017-03-19 DIAGNOSIS — I10 Essential (primary) hypertension: Secondary | ICD-10-CM

## 2017-03-19 DIAGNOSIS — I209 Angina pectoris, unspecified: Secondary | ICD-10-CM | POA: Diagnosis not present

## 2017-03-19 DIAGNOSIS — I421 Obstructive hypertrophic cardiomyopathy: Secondary | ICD-10-CM | POA: Diagnosis not present

## 2017-03-19 MED ORDER — METOPROLOL TARTRATE 50 MG PO TABS: 50.0000 mg | ORAL_TABLET | Freq: Two times a day (BID) | ORAL | 6 refills | Status: DC

## 2017-03-19 NOTE — Patient Instructions (Addendum)
Medication Instructions:  Your physician has recommended you make the following change in your medication:  Increase your Metoprolol to 50 mg 1 tablet two times daily.  Labwork: None ordered  Testing/Procedures: EKG today  Follow-Up: Your physician recommends that you schedule a follow-up appointment in: 2 months with Dr. Bing MatterKrasowski  Any Other Special Instructions Will Be Listed Below (If Applicable).     If you need a refill on your cardiac medications before your next appointment, please call your pharmacy.

## 2017-03-19 NOTE — Progress Notes (Signed)
Cardiology Office Note:    Date:  03/19/2017   ID:  Stephanie Cordova, DOB [birth year], MRN [id number]  PCP:  Mosie Lukes, MD  Cardiologist:  Jenne Campus, MD    Referring MD: Mosie Lukes, MD   Chief Complaint  Patient presents with  . Follow-up  Still having some chest pain  History of Present Illness:    Stephanie Cordova is a 61 y.o. female with multiple risk factors for coronary artery disease.  Cardiac catheterization done which showed normal coronaries.  She does have mild gradient across left ventricular outflow tract.  We trying to gradually reduce her blood pressure.  What I will ask you today is to have a EKG done if EKG is fine I will double the dose of metoprolol from 25 twice daily to 50 mg twice a day.  She joined the [employer] and she goes to water aerobics 3 times a week I encouraged her to continue.  She seems to be disappointed that she does not see any results of it I told her to be a little more patient to take some time to build up stamina again.  Past Medical History:  Diagnosis Date  . Abnormal liver function tests 08/07/2016  . Arthritis    b/l knees, bone on bone  . Cirrhosis of liver ([place]) 12/21/2012   Confirmed via ultrasound per patient, she reports neg acute hepatitis panel   . Cold sore 11/04/2016  . Diabetes mellitus type 2 in obese ([place]) 12/21/2012   takes Amaryl daily  . History of bronchitis 03/2015  . History of colon polyps    benign  . Hypertension    takes Metoprolol and Lisinopril daily  . Insomnia   . Pneumonia    hx of > 5 yrs ago  . Pulmonary nodule 08/07/2016  . Sinusitis, acute 01/26/2016  . Vaginitis 10/30/2016  . Vitamin D deficiency 04/24/2016    Past Surgical History:  Procedure Laterality Date  . ABDOMINAL HYSTERECTOMY  1996  . BREAST BIOPSY    . CHOLECYSTECTOMY N/A 09/06/2015   Procedure: LAPAROSCOPIC CHOLECYSTECTOMY;  Surgeon: Ralene Ok, MD;  Location: [place];  Service: General;  Laterality: N/A;  . COLONOSCOPY WITH  ESOPHAGOGASTRODUODENOSCOPY (EGD)    . KNEE ARTHROSCOPY Bilateral   . LEFT HEART CATH AND CORONARY ANGIOGRAPHY N/A 12/27/2016   Procedure: LEFT HEART CATH AND CORONARY ANGIOGRAPHY;  Surgeon: Sherren Mocha, MD;  Location: [place] [facility];  Service: Cardiovascular;  Laterality: N/A;  . TONSILLECTOMY    . TUBAL LIGATION      Current Medications: Current Meds  Medication Sig  . amLODipine (NORVASC) 5 MG tablet Take 1 tablet (5 mg total) by mouth daily.  Marland Kitchen aspirin EC 81 MG tablet Take 1 tablet (81 mg total) by mouth daily.  . B-D ULTRAFINE III SHORT PEN 31G X 8 MM MISC USE THREE TIMES DAILY  . Blood Glucose Monitoring Suppl (ONE TOUCH ULTRA SYSTEM KIT) w/Device KIT Use as directed twice daily to check blood sugar.  DX E11.9  . furosemide (LASIX) 20 MG tablet Take 20 mg by mouth daily.  Marland Kitchen glimepiride (AMARYL) 4 MG tablet Take 1 tablet (4 mg total) by mouth 2 (two) times daily.  Marland Kitchen HUMALOG KWIKPEN 100 UNIT/ML KiwkPen INJECT 15 UNITS UNDER THE SKIN THREE TIMES DAILY WITH FOOD  . lisinopril (PRINIVIL,ZESTRIL) 20 MG tablet Take 1 tablet (20 mg total) by mouth 2 (two) times daily.  . metoprolol tartrate (LOPRESSOR) 25 MG tablet Take 1 tablet (25  mg total) 2 (two) times daily by mouth.  . nitroGLYCERIN (NITROSTAT) 0.4 MG SL tablet Place 1 tablet (0.4 mg total) under the tongue every 5 (five) minutes as needed for chest pain.  Marland Kitchen nystatin-triamcinolone ointment (MYCOLOG) Apply 1 application topically 2 (two) times daily.  . ONE TOUCH ULTRA TEST test strip USE AS DIRECTED TWICE A DAY  . ONETOUCH DELICA LANCETS 93A MISC Use as directed twice daily to check blood sugar.  DX E11.9  . ONETOUCH VERIO test strip TEST 2 TIMES DAILY  . rosuvastatin (CRESTOR) 10 MG tablet Take 1 tablet (10 mg total) daily by mouth.  . Vitamin D, Ergocalciferol, (DRISDOL) 50000 units CAPS capsule Take 1 capsule (50,000 Units total) by mouth every 7 (seven) days.     Allergies:   Losartan; Lexapro [escitalopram]; and Sulfa  antibiotics   Social History   Socioeconomic History  . Marital status: Widowed    Spouse name: None  . Number of children: None  . Years of education: None  . Highest education level: None  Social Needs  . Financial resource strain: None  . Food insecurity - worry: None  . Food insecurity - inability: None  . Transportation needs - medical: None  . Transportation needs - non-medical: None  Occupational History  . Occupation: Therapist, art  Tobacco Use  . Smoking status: Never Smoker  . Smokeless tobacco: Never Used  Substance and Sexual Activity  . Alcohol use: No    Alcohol/week: 0.0 oz  . Drug use: No  . Sexual activity: No    Comment: lives alone, widowed in 2006  Other Topics Concern  . None  Social History Narrative  . None     Family History: The patient's family history includes Alzheimer's disease in her father; Cancer in her mother; Diabetes in her father; Heart disease in her father and maternal grandfather; Hyperlipidemia in her father and mother; Hypertension in her father and mother; Leukemia in her mother; Multiple sclerosis in her son. ROS:   Please see the history of present illness.    All 14 point review of systems negative except as described per history of present illness  EKGs/Labs/Other Studies Reviewed:      Recent Labs: 08/07/2016: TSH 1.42 11/14/2016: ALT 34; Pro B Natriuretic peptide (BNP) 88.0 12/04/2016: Platelets 155 12/25/2016: BUN 12; Creatinine, Ser 0.76; Hemoglobin 13.9; Potassium 3.7; Sodium 141  Recent Lipid Panel    Component Value Date/Time   CHOL 149 08/07/2016 1024   TRIG 72.0 08/07/2016 1024   HDL 63.00 08/07/2016 1024   CHOLHDL 2 08/07/2016 1024   VLDL 14.4 08/07/2016 1024   LDLCALC 72 08/07/2016 1024    Physical Exam:    VS:  BP (!) 160/106 (BP Location: Right Arm, Patient Position: Sitting, Cuff Size: Normal)   Pulse 89   Ht '5\' 2"'$  (1.575 m)   Wt 273 lb (123.8 kg)   LMP 01/29/1994   SpO2 97%   BMI 49.93  kg/m     Wt Readings from Last 3 Encounters:  03/19/17 273 lb (123.8 kg)  02/07/17 275 lb 1.3 oz (124.8 kg)  12/27/16 262 lb (118.8 kg)     GEN:  Well nourished, well developed in no acute distress HEENT: Normal NECK: No JVD; No carotid bruits LYMPHATICS: No lymphadenopathy CARDIAC: RRR, soft ejection murmur grade 1/6 to 2/6 best heard at the right upper portion of the sternum, no rubs, no gallops RESPIRATORY:  Clear to auscultation without rales, wheezing or rhonchi  ABDOMEN: Soft, non-tender,  non-distended MUSCULOSKELETAL:  No edema; No deformity  SKIN: Warm and dry LOWER EXTREMITIES: no swelling NEUROLOGIC:  Alert and oriented x 3 PSYCHIATRIC:  Normal affect   ASSESSMENT:    1. Angina pectoris ([place])   2. Obstructive hypertrophic cardiomyopathy ([place])   3. Essential hypertension    PLAN:    In order of problems listed above:  1. Angina pectoris: Normal cardiac catheterization.  I think the key right now is to concentrate on her blood pressure reduction we will try to double the dose of metoprolol if EKG show me normal PR interval. 2. Obstructive hypertrophic cardiomyopathy without critical gradient.  Beta-blocker should help with the situation. 3. Essential hypertension: Blood pressure will be hopefully better with increasing dose of beta-blocker.   Medication Adjustments/Labs and Tests Ordered: Current medicines are reviewed at length with the patient today.  Concerns regarding medicines are outlined above.  No orders of the defined types were placed in this encounter.  Medication changes: No orders of the defined types were placed in this encounter.   Signed, Park Liter, MD, [facility] 03/19/2017 11:42 AM    [place]

## 2017-03-21 ENCOUNTER — Ambulatory Visit: Payer: No Typology Code available for payment source | Admitting: Family Medicine

## 2017-04-09 ENCOUNTER — Ambulatory Visit: Payer: BLUE CROSS/BLUE SHIELD | Admitting: Family Medicine

## 2017-05-14 ENCOUNTER — Telehealth: Payer: Self-pay

## 2017-05-14 DIAGNOSIS — E1169 Type 2 diabetes mellitus with other specified complication: Secondary | ICD-10-CM

## 2017-05-14 DIAGNOSIS — E669 Obesity, unspecified: Principal | ICD-10-CM

## 2017-05-14 NOTE — Telephone Encounter (Signed)
Copied from CRM [id number]. Topic: Appointment Scheduling - Scheduling Inquiry for Clinic >> May 14, 2017 10:15 AM Stephanie Cordova, [name] wrote: Reason for CRM: [phone]  Pt needs to have order for a1c.  Pt needs to have this done in order to get cortisone shot from ortho dr.   Rock NephewPt has appt with Dr blythe in mid may but would like this sooner. Please call [phone]    Orders placed left message for patient to call the office to make a lab appointment

## 2017-05-16 ENCOUNTER — Other Ambulatory Visit (INDEPENDENT_AMBULATORY_CARE_PROVIDER_SITE_OTHER): Payer: BLUE CROSS/BLUE SHIELD

## 2017-05-16 DIAGNOSIS — E1169 Type 2 diabetes mellitus with other specified complication: Secondary | ICD-10-CM

## 2017-05-16 DIAGNOSIS — E669 Obesity, unspecified: Secondary | ICD-10-CM | POA: Diagnosis not present

## 2017-05-16 LAB — HEMOGLOBIN A1C: HEMOGLOBIN A1C: 9 % — AB (ref 4.6–6.5)

## 2017-06-13 ENCOUNTER — Encounter: Payer: Self-pay | Admitting: Family Medicine

## 2017-06-13 ENCOUNTER — Ambulatory Visit: Payer: BLUE CROSS/BLUE SHIELD | Admitting: Family Medicine

## 2017-06-13 VITALS — BP 130/80 | HR 68 | Temp 97.8°F | Resp 18 | Wt 273.0 lb

## 2017-06-13 DIAGNOSIS — E559 Vitamin D deficiency, unspecified: Secondary | ICD-10-CM

## 2017-06-13 DIAGNOSIS — G8929 Other chronic pain: Secondary | ICD-10-CM

## 2017-06-13 DIAGNOSIS — E1169 Type 2 diabetes mellitus with other specified complication: Secondary | ICD-10-CM | POA: Diagnosis not present

## 2017-06-13 DIAGNOSIS — I10 Essential (primary) hypertension: Secondary | ICD-10-CM

## 2017-06-13 DIAGNOSIS — E6609 Other obesity due to excess calories: Secondary | ICD-10-CM

## 2017-06-13 DIAGNOSIS — M25562 Pain in left knee: Secondary | ICD-10-CM | POA: Diagnosis not present

## 2017-06-13 DIAGNOSIS — E669 Obesity, unspecified: Secondary | ICD-10-CM | POA: Diagnosis not present

## 2017-06-13 DIAGNOSIS — E782 Mixed hyperlipidemia: Secondary | ICD-10-CM | POA: Diagnosis not present

## 2017-06-13 MED ORDER — OFLOXACIN 0.3 % OP SOLN: 1.0000 [drp] | Freq: Four times a day (QID) | OPHTHALMIC | 1 refills | Status: DC

## 2017-06-13 MED ORDER — INSULIN LISPRO 100 UNIT/ML (KWIKPEN): 10.0000 [IU] | PEN_INJECTOR | Freq: Three times a day (TID) | SUBCUTANEOUS | 0 refills | Status: DC

## 2017-06-13 MED ORDER — LIRAGLUTIDE 18 MG/3ML ~~LOC~~ SOPN: PEN_INJECTOR | SUBCUTANEOUS | 5 refills | Status: DC

## 2017-06-13 MED ORDER — INSULIN GLARGINE 100 UNIT/ML SOLOSTAR PEN: PEN_INJECTOR | SUBCUTANEOUS | 99 refills | Status: DC

## 2017-06-13 NOTE — Progress Notes (Signed)
Subjective:  I acted as a Education administrator for Dr. Charlett Blake. Stephanie Cordova, Stephanie Cordova  Patient ID: Stephanie Cordova, female    DOB: 1955-06-08, 62 y.o.   MRN: [id number]  No chief complaint on file.   HPI  Patient is in today for a follow up for DM, HTN and various medical concerns. She is noting some mild congestion and irritation in left eye. No visual changes or headache. She continues to have trouble with her knees. Is following with orthopaedics and has been getting some shots right hurts more than left knee. No recnet falls or trauma. No redness or warmth. Denies CP/palp/SOB/HA/congestion/fevers/GI or GU c/o. Taking meds as prescribed  Patient Care Team: Mosie Lukes, MD as PCP - General (Family Medicine)   Past Medical History:  Diagnosis Date  . Abnormal liver function tests 08/07/2016  . Arthritis    b/l knees, bone on bone  . Cirrhosis of liver ([place]) 12/21/2012   Confirmed via ultrasound per patient, she reports neg acute hepatitis panel   . Cold sore 11/04/2016  . Diabetes mellitus type 2 in obese ([place]) 12/21/2012   takes Amaryl daily  . History of bronchitis 03/2015  . History of colon polyps    benign  . Hypertension    takes Metoprolol and Lisinopril daily  . Insomnia   . Pneumonia    hx of > 5 yrs ago  . Pulmonary nodule 08/07/2016  . Sinusitis, acute 01/26/2016  . Vaginitis 10/30/2016  . Vitamin D deficiency 04/24/2016    Past Surgical History:  Procedure Laterality Date  . ABDOMINAL HYSTERECTOMY  1996  . BREAST BIOPSY    . CHOLECYSTECTOMY N/A 09/06/2015   Procedure: LAPAROSCOPIC CHOLECYSTECTOMY;  Surgeon: Ralene Ok, MD;  Location: [facility];  Service: General;  Laterality: N/A;  . COLONOSCOPY WITH ESOPHAGOGASTRODUODENOSCOPY (EGD)    . KNEE ARTHROSCOPY Bilateral   . LEFT HEART CATH AND CORONARY ANGIOGRAPHY N/A 12/27/2016   Procedure: LEFT HEART CATH AND CORONARY ANGIOGRAPHY;  Surgeon: Sherren Mocha, MD;  Location: [facility];  Service: Cardiovascular;  Laterality: N/A;  .  TONSILLECTOMY    . TUBAL LIGATION      Family History  Problem Relation Age of Onset  . Hypertension Mother   . Hyperlipidemia Mother   . Leukemia Mother   . Cancer Mother   . Hypertension Father   . Hyperlipidemia Father   . Diabetes Father   . Heart disease Father   . Alzheimer's disease Father   . Multiple sclerosis Son   . Heart disease Maternal Grandfather     Social History   Socioeconomic History  . Marital status: Widowed    Spouse name: Not on file  . Number of children: Not on file  . Years of education: Not on file  . Highest education level: Not on file  Occupational History  . Occupation: Therapist, art  Social Needs  . Financial resource strain: Not on file  . Food insecurity:    Worry: Not on file    Inability: Not on file  . Transportation needs:    Medical: Not on file    Non-medical: Not on file  Tobacco Use  . Smoking status: Never Smoker  . Smokeless tobacco: Never Used  Substance and Sexual Activity  . Alcohol use: No    Alcohol/week: 0.0 oz  . Drug use: No  . Sexual activity: Never    Comment: lives alone, widowed in 2006  Lifestyle  . Physical activity:    Days per  week: Not on file    Minutes per session: Not on file  . Stress: Not on file  Relationships  . Social connections:    Talks on phone: Not on file    Gets together: Not on file    Attends religious service: Not on file    Active member of club or organization: Not on file    Attends meetings of clubs or organizations: Not on file    Relationship status: Not on file  . Intimate partner violence:    Fear of current or ex partner: Not on file    Emotionally abused: Not on file    Physically abused: Not on file    Forced sexual activity: Not on file  Other Topics Concern  . Not on file  Social History Narrative  . Not on file    Outpatient Medications Prior to Visit  Medication Sig Dispense Refill  . aspirin EC 81 MG tablet Take 1 tablet (81 mg total) by mouth  daily.    . B-D ULTRAFINE III SHORT PEN 31G X 8 MM MISC USE THREE TIMES DAILY 100 each 1  . Blood Glucose Monitoring Suppl (ONE TOUCH ULTRA SYSTEM KIT) w/Device KIT Use as directed twice daily to check blood sugar.  DX E11.9 1 each 0  . furosemide (LASIX) 20 MG tablet Take 20 mg by mouth daily.    Marland Kitchen glimepiride (AMARYL) 4 MG tablet Take 1 tablet (4 mg total) by mouth 2 (two) times daily. 60 tablet 3  . lisinopril (PRINIVIL,ZESTRIL) 20 MG tablet Take 1 tablet (20 mg total) by mouth 2 (two) times daily. 60 tablet 3  . metoprolol tartrate (LOPRESSOR) 50 MG tablet Take 1 tablet (50 mg total) by mouth 2 (two) times daily. 60 tablet 6  . nitroGLYCERIN (NITROSTAT) 0.4 MG SL tablet Place 1 tablet (0.4 mg total) under the tongue every 5 (five) minutes as needed for chest pain. 25 tablet 3  . nystatin-triamcinolone ointment (MYCOLOG) Apply 1 application topically 2 (two) times daily. 30 g 0  . ONE TOUCH ULTRA TEST test strip USE AS DIRECTED TWICE A DAY 100 each 2  . ONETOUCH DELICA LANCETS 32I MISC Use as directed twice daily to check blood sugar.  DX E11.9 100 each 5  . ONETOUCH VERIO test strip TEST 2 TIMES DAILY 100 each 12  . Vitamin D, Ergocalciferol, (DRISDOL) 50000 units CAPS capsule Take 1 capsule (50,000 Units total) by mouth every 7 (seven) days. 12 capsule 0  . HUMALOG KWIKPEN 100 UNIT/ML KiwkPen INJECT 15 UNITS UNDER THE SKIN THREE TIMES DAILY WITH FOOD 495 mL 0  . amLODipine (NORVASC) 5 MG tablet Take 1 tablet (5 mg total) by mouth daily. 180 tablet 3  . rosuvastatin (CRESTOR) 10 MG tablet Take 1 tablet (10 mg total) daily by mouth. 30 tablet 6   No facility-administered medications prior to visit.     Allergies  Allergen Reactions  . Losartan Palpitations  . Lexapro [Escitalopram] Other (See Comments)    swelling  . Sulfa Antibiotics Hives and Rash    "Burning" rash    Review of Systems  Constitutional: Negative for fever.  HENT: Negative for congestion.   Eyes: Positive for pain.  Negative for blurred vision, double vision, photophobia, discharge and redness.  Respiratory: Negative for cough.   Cardiovascular: Negative for chest pain and palpitations.  Gastrointestinal: Negative for vomiting.  Musculoskeletal: Positive for joint pain. Negative for back pain.  Skin: Negative for rash.  Neurological: Negative for loss  of consciousness and headaches.       Objective:    Physical Exam  Constitutional: She is oriented to person, place, and time. No distress.  HENT:  Head: Normocephalic and atraumatic.  Eyes: Conjunctivae are normal.  Neck: Neck supple. No thyromegaly present.  Cardiovascular: Normal rate, regular rhythm and normal heart sounds.  No murmur heard. Pulmonary/Chest: Effort normal and breath sounds normal. She has no wheezes.  Abdominal: She exhibits no distension and no mass.  Musculoskeletal: She exhibits no edema.  Lymphadenopathy:    She has no cervical adenopathy.  Neurological: She is alert and oriented to person, place, and time.  Skin: Skin is warm and dry. No rash noted. She is not diaphoretic.  Psychiatric: Judgment normal.    BP 130/80   Pulse 68   Temp 97.8 F (36.6 C) (Oral)   Resp 18   Wt 273 lb (123.8 kg)   LMP 01/29/1994   SpO2 97%   BMI 49.93 kg/m  Wt Readings from Last 3 Encounters:  06/13/17 273 lb (123.8 kg)  03/19/17 273 lb (123.8 kg)  02/07/17 275 lb 1.3 oz (124.8 kg)   BP Readings from Last 3 Encounters:  06/13/17 130/80  03/19/17 (!) 160/106  02/07/17 (!) 168/98     Immunization History  Administered Date(s) Administered  . Influenza Split 12/23/2006  . Influenza,inj,Quad PF,6+ Mos 11/14/2016  . Pneumococcal-Unspecified 12/30/2011  . Td 01/29/2009    Health Maintenance  Topic Date Due  . Hepatitis C Screening  September 08, 1955  . HIV Screening  04/01/1971  . PNEUMOCOCCAL POLYSACCHARIDE VACCINE (2) 12/29/2016  . OPHTHALMOLOGY EXAM  12/29/2016  . FOOT EXAM  04/24/2017  . MAMMOGRAM  06/06/2017  .  INFLUENZA VACCINE  08/29/2017  . HEMOGLOBIN A1C  11/15/2017  . TETANUS/TDAP  01/30/2019  . PAP SMEAR  12/19/2019  . COLONOSCOPY  11/27/2022    Lab Results  Component Value Date   WBC 9.0 06/13/2017   HGB 14.0 06/13/2017   HCT 41.8 06/13/2017   PLT 192.0 06/13/2017   GLUCOSE 223 (H) 06/13/2017   CHOL 147 06/13/2017   TRIG 135.0 06/13/2017   HDL 65.10 06/13/2017   LDLCALC 55 06/13/2017   ALT 38 (H) 06/13/2017   AST 41 (H) 06/13/2017   NA 138 06/13/2017   K 3.9 06/13/2017   CL 101 06/13/2017   CREATININE 0.98 06/13/2017   BUN 15 06/13/2017   CO2 27 06/13/2017   TSH 1.50 06/13/2017   INR 1.1 12/25/2016   HGBA1C 9.0 (H) 05/16/2017   MICROALBUR 2.6 (H) 12/09/2015    Lab Results  Component Value Date   TSH 1.50 06/13/2017   Lab Results  Component Value Date   WBC 9.0 06/13/2017   HGB 14.0 06/13/2017   HCT 41.8 06/13/2017   MCV 88.7 06/13/2017   PLT 192.0 06/13/2017   Lab Results  Component Value Date   NA 138 06/13/2017   K 3.9 06/13/2017   CO2 27 06/13/2017   GLUCOSE 223 (H) 06/13/2017   BUN 15 06/13/2017   CREATININE 0.98 06/13/2017   BILITOT 0.5 06/13/2017   ALKPHOS 97 06/13/2017   AST 41 (H) 06/13/2017   ALT 38 (H) 06/13/2017   PROT 6.2 06/13/2017   ALBUMIN 3.7 06/13/2017   CALCIUM 9.2 06/13/2017   ANIONGAP 9 03/05/2016   GFR 61.28 06/13/2017   Lab Results  Component Value Date   CHOL 147 06/13/2017   Lab Results  Component Value Date   HDL 65.10 06/13/2017   Lab Results  Component Value Date   LDLCALC 55 06/13/2017   Lab Results  Component Value Date   TRIG 135.0 06/13/2017   Lab Results  Component Value Date   CHOLHDL 2 06/13/2017   Lab Results  Component Value Date   HGBA1C 9.0 (H) 05/16/2017         Assessment & Plan:   Problem List Items Addressed This Visit    Obesity    Encouraged DASH diet, decrease po intake and increase exercise as tolerated. Needs 7-8 hours of sleep nightly. Avoid trans fats, eat small, frequent  meals every 4-5 hours with lean proteins, complex carbs and healthy fats. Minimize simple carbs      Relevant Medications   liraglutide (VICTOZA) 18 MG/3ML SOPN   Insulin Glargine (LANTUS SOLOSTAR) 100 UNIT/ML Solostar Pen   insulin lispro (HUMALOG KWIKPEN) 100 UNIT/ML KiwkPen   HTN (hypertension) - Primary    hgba1c acceptable, minimize simple carbs. Increase exercise as tolerated. Continue current meds      Relevant Orders   CBC (Completed)   Comprehensive metabolic panel (Completed)   Lipid panel (Completed)   TSH (Completed)   Diabetes mellitus type 2 in obese ([facility])    hgba1c acceptable, minimize simple carbs. Increase exercise as tolerated. Continue current meds      Relevant Medications   liraglutide (VICTOZA) 18 MG/3ML SOPN   Insulin Glargine (LANTUS SOLOSTAR) 100 UNIT/ML Solostar Pen   insulin lispro (HUMALOG KWIKPEN) 100 UNIT/ML KiwkPen   Left knee pain    Both knees bothering her now. Encouraged moist heat and gentle stretching as tolerated. May try NSAIDs and prescription meds as directed and report if symptoms worsen or seek immediate care. Lidocaine prn      Vitamin D deficiency   Relevant Orders   VITAMIN D 25 Hydroxy (Vit-D Deficiency, Fractures) (Completed)   Hyperlipidemia    Encouraged heart healthy diet, increase exercise, avoid trans fats, consider a krill oil cap daily         I have changed Stephanie Cordova's HUMALOG KWIKPEN to insulin lispro. I am also having her start on ofloxacin, liraglutide, and Insulin Glargine. Additionally, I am having her maintain her ONE TOUCH ULTRA SYSTEM KIT, ONE TOUCH ULTRA TEST, ONETOUCH DELICA LANCETS 10F, glimepiride, lisinopril, nitroGLYCERIN, aspirin EC, rosuvastatin, nystatin-triamcinolone ointment, B-D ULTRAFINE III SHORT PEN, Vitamin D (Ergocalciferol), amLODipine, ONETOUCH VERIO, furosemide, and metoprolol tartrate.  Meds ordered this encounter  Medications  . ofloxacin (OCUFLOX) 0.3 % ophthalmic solution    Sig:  Place 1 drop into the right eye 4 (four) times daily.    Dispense:  5 mL    Refill:  1  . liraglutide (VICTOZA) 18 MG/3ML SOPN    Sig: 6 mg [place] daily x  7 days and then increase to 1.2 mg sq daily    Dispense:  4 pen    Refill:  5  . Insulin Glargine (LANTUS SOLOSTAR) 100 UNIT/ML Solostar Pen    Sig: 6 mg SQ daily x 7 days then increas to 1.2 mg QC daily    Dispense:  5 pen    Refill:  PRN  . insulin lispro (HUMALOG KWIKPEN) 100 UNIT/ML KiwkPen    Sig: Inject 0.1 mLs (10 Units total) into the skin 3 (three) times daily.    Dispense:  495 mL    Refill:  0    CMA served as scribe during this visit. History, Physical and Plan performed by medical provider. Documentation and orders reviewed and attested to.  Penni Homans, MD

## 2017-06-13 NOTE — Patient Instructions (Signed)

## 2017-06-14 ENCOUNTER — Telehealth: Payer: Self-pay

## 2017-06-14 LAB — CBC
HCT: 41.8 % (ref 36.0–46.0)
HEMOGLOBIN: 14 g/dL (ref 12.0–15.0)
MCHC: 33.4 g/dL (ref 30.0–36.0)
MCV: 88.7 fl (ref 78.0–100.0)
PLATELETS: 192 10*3/uL (ref 150.0–400.0)
RBC: 4.71 Mil/uL (ref 3.87–5.11)
RDW: 14.5 % (ref 11.5–15.5)
WBC: 9 10*3/uL (ref 4.0–10.5)

## 2017-06-14 LAB — COMPREHENSIVE METABOLIC PANEL
ALBUMIN: 3.7 g/dL (ref 3.5–5.2)
ALK PHOS: 97 U/L (ref 39–117)
ALT: 38 U/L — AB (ref 0–35)
AST: 41 U/L — ABNORMAL HIGH (ref 0–37)
BILIRUBIN TOTAL: 0.5 mg/dL (ref 0.2–1.2)
BUN: 15 mg/dL (ref 6–23)
CO2: 27 mEq/L (ref 19–32)
Calcium: 9.2 mg/dL (ref 8.4–10.5)
Chloride: 101 mEq/L (ref 96–112)
Creatinine, Ser: 0.98 mg/dL (ref 0.40–1.20)
GFR: 61.28 mL/min (ref >60.00)
GLUCOSE: 223 mg/dL — AB (ref 70–99)
Potassium: 3.9 mEq/L (ref 3.5–5.1)
Sodium: 138 mEq/L (ref 135–145)
TOTAL PROTEIN: 6.2 g/dL (ref 6.0–8.3)

## 2017-06-14 LAB — LIPID PANEL
Cholesterol: 147 mg/dL (ref 0–200)
HDL: 65.1 mg/dL (ref >39.00)
LDL Cholesterol: 55 mg/dL (ref 0–99)
NONHDL: 82.27
TRIGLYCERIDES: 135 mg/dL (ref 0.0–149.0)
Total CHOL/HDL Ratio: 2
VLDL: 27 mg/dL (ref 0.0–40.0)

## 2017-06-14 LAB — VITAMIN D 25 HYDROXY (VIT D DEFICIENCY, FRACTURES): VITD: 29.44 ng/mL — ABNORMAL LOW (ref 30.00–100.00)

## 2017-06-14 LAB — TSH: TSH: 1.5 u[IU]/mL (ref 0.35–4.50)

## 2017-06-14 NOTE — Telephone Encounter (Signed)
PA initiated via Covermymeds; KEY: [id number]. Awaiting determination.

## 2017-06-16 NOTE — Assessment & Plan Note (Signed)
hgba1c acceptable, minimize simple carbs. Increase exercise as tolerated. Continue current meds 

## 2017-06-16 NOTE — Assessment & Plan Note (Signed)
Encouraged DASH diet, decrease po intake and increase exercise as tolerated. Needs 7-8 hours of sleep nightly. Avoid trans fats, eat small, frequent meals every 4-5 hours with lean proteins, complex carbs and healthy fats. Minimize simple carbs 

## 2017-06-16 NOTE — Assessment & Plan Note (Signed)
Encouraged heart healthy diet, increase exercise, avoid trans fats, consider a krill oil cap daily 

## 2017-06-16 NOTE — Assessment & Plan Note (Signed)
Both knees bothering her now. Encouraged moist heat and gentle stretching as tolerated. May try NSAIDs and prescription meds as directed and report if symptoms worsen or seek immediate care. Lidocaine prn

## 2017-06-17 NOTE — Telephone Encounter (Signed)
OK to switch from Humalog to Novolog with same sig and same amount with 2 rf. May need needles as well

## 2017-06-17 NOTE — Telephone Encounter (Signed)
PA denied. Preferred alternatives: Novolin or Novolog.

## 2017-06-20 MED ORDER — INSULIN ASPART 100 UNIT/ML ~~LOC~~ SOLN: 10.0000 [IU] | Freq: Three times a day (TID) | SUBCUTANEOUS | 2 refills | Status: DC

## 2017-06-20 NOTE — Telephone Encounter (Signed)
Called pt no answer to let her know Rx was sent in

## 2017-07-12 ENCOUNTER — Other Ambulatory Visit: Payer: Self-pay | Admitting: Family Medicine

## 2017-07-12 NOTE — Telephone Encounter (Signed)
Do you want her to continue? 

## 2017-07-22 ENCOUNTER — Encounter: Payer: Self-pay | Admitting: Family Medicine

## 2017-07-22 ENCOUNTER — Other Ambulatory Visit: Payer: Self-pay | Admitting: Family Medicine

## 2017-07-22 DIAGNOSIS — L299 Pruritus, unspecified: Secondary | ICD-10-CM

## 2017-07-22 DIAGNOSIS — L989 Disorder of the skin and subcutaneous tissue, unspecified: Secondary | ICD-10-CM

## 2017-07-23 ENCOUNTER — Other Ambulatory Visit: Payer: Self-pay | Admitting: Family Medicine

## 2017-07-23 MED ORDER — BACITRACIN-POLYMYXIN B OP OINT: 1.0000 "application " | TOPICAL_OINTMENT | Freq: Two times a day (BID) | OPHTHALMIC | 0 refills | Status: DC

## 2017-07-23 NOTE — Telephone Encounter (Signed)
Spoke with patient she stated the eye drops did not work for her, wants to know if you can send another type of eye drops in for her. She also stated she will begin the OTC meds again.  Please advise

## 2017-07-26 ENCOUNTER — Encounter: Payer: Self-pay | Admitting: Family Medicine

## 2017-07-30 ENCOUNTER — Ambulatory Visit (INDEPENDENT_AMBULATORY_CARE_PROVIDER_SITE_OTHER): Payer: BLUE CROSS/BLUE SHIELD | Admitting: Family Medicine

## 2017-07-30 VITALS — BP 142/84 | HR 110 | Temp 98.4°F | Resp 18 | Wt 274.0 lb

## 2017-07-30 DIAGNOSIS — E782 Mixed hyperlipidemia: Secondary | ICD-10-CM | POA: Diagnosis not present

## 2017-07-30 DIAGNOSIS — I10 Essential (primary) hypertension: Secondary | ICD-10-CM | POA: Diagnosis not present

## 2017-07-30 DIAGNOSIS — E1169 Type 2 diabetes mellitus with other specified complication: Secondary | ICD-10-CM

## 2017-07-30 DIAGNOSIS — H00012 Hordeolum externum right lower eyelid: Secondary | ICD-10-CM | POA: Diagnosis not present

## 2017-07-30 DIAGNOSIS — E669 Obesity, unspecified: Secondary | ICD-10-CM

## 2017-07-30 MED ORDER — ACYCLOVIR 400 MG PO TABS: 400.0000 mg | ORAL_TABLET | Freq: Every day | ORAL | 0 refills | Status: DC

## 2017-07-30 MED ORDER — AMOXICILLIN-POT CLAVULANATE 875-125 MG PO TABS: 1.0000 | ORAL_TABLET | Freq: Two times a day (BID) | ORAL | 0 refills | Status: DC

## 2017-07-30 NOTE — Patient Instructions (Signed)
Hordeleum  Stye A stye is a bump on your eyelid caused by a bacterial infection. A stye can form inside the eyelid (internal stye) or outside the eyelid (external stye). An internal stye may be caused by an infected oil-producing gland inside your eyelid. An external stye may be caused by an infection at the base of your eyelash (hair follicle). Styes are very common. Anyone can get them at any age. They usually occur in just one eye, but you may have more than one in either eye. What are the causes? The infection is almost always caused by bacteria called Staphylococcus aureus. This is a common type of bacteria that lives on your skin. What increases the risk? You may be at higher risk for a stye if you have had one before. You may also be at higher risk if you have:  Diabetes.  Long-term illness.  Long-term eye redness.  A skin condition called seborrhea.  High fat levels in your blood (lipids).  What are the signs or symptoms? Eyelid pain is the most common symptom of a stye. Internal styes are more painful than external styes. Other signs and symptoms may include:  Painful swelling of your eyelid.  A scratchy feeling in your eye.  Tearing and redness of your eye.  Pus draining from the stye.  How is this diagnosed? Your health care provider may be able to diagnose a stye just by examining your eye. The health care provider may also check to make sure:  You do not have a fever or other signs of a more serious infection.  The infection has not spread to other parts of your eye or areas around your eye.  How is this treated? Most styes will clear up in a few days without treatment. In some cases, you may need to use antibiotic drops or ointment to prevent infection. Your health care provider may have to drain the stye surgically if your stye is:  Large.  Causing a lot of pain.  Interfering with your vision.  This can be done using a thin blade or a needle. Follow  these instructions at home:  Take medicines only as directed by your health care provider.  Apply a clean, warm compress to your eye for 10 minutes, 4 times a day.  Do not wear contact lenses or eye makeup until your stye has healed.  Do not try to pop or drain the stye. Contact a health care provider if:  You have chills or a fever.  Your stye does not go away after several days.  Your stye affects your vision.  Your eyeball becomes swollen, red, or painful. This information is not intended to replace advice given to you by your health care provider. Make sure you discuss any questions you have with your health care provider. Document Released: 10/25/2004 Document Revised: 09/11/2015 Document Reviewed: 05/01/2013 Elsevier Interactive Patient Education  [employer]2018 [employer]

## 2017-07-31 ENCOUNTER — Encounter: Payer: Self-pay | Admitting: Family Medicine

## 2017-08-04 DIAGNOSIS — H00012 Hordeolum externum right lower eyelid: Secondary | ICD-10-CM | POA: Insufficient documentation

## 2017-08-04 NOTE — Assessment & Plan Note (Signed)
Denies CP/palp/SOB/HA/congestion/fevers/GI or GU c/o. Taking meds as prescribed 

## 2017-08-04 NOTE — Progress Notes (Signed)
Subjective:    Patient ID: Stephanie Cordova, female    DOB: 22-Mar-1955, 62 y.o.   MRN: [id number]  No chief complaint on file.   HPI Patient is in today for evaluation of an infection in her right lower eyelid.  She has been tried on anti-biotic drops and ointment and if the lesions persist.  She does think the medications help slightly.  There is no burning or severe pain at this [place] sore will not resolve.  No changes in her vision.  No eye pain or discharge although there is some mild gray sometimes in the mornings.  No fevers or chills.  No head congestion or other acute concerns noted at this time. Encouraged increased hydration, 64 ounces of clear fluids daily. Minimize alcohol and caffeine. Eat small frequent meals with lean proteins and complex carbs. Avoid high and low blood sugars. Get adequate sleep, 7-8 hours a night. Needs exercise daily preferably in the morning.  Past Medical History:  Diagnosis Date  . Abnormal liver function tests 08/07/2016  . Arthritis    b/l knees, bone on bone  . Cirrhosis of liver ([place]) 12/21/2012   Confirmed via ultrasound per patient, she reports neg acute hepatitis panel   . Cold sore 11/04/2016  . Diabetes mellitus type 2 in obese ([place]) 12/21/2012   takes Amaryl daily  . History of bronchitis 03/2015  . History of colon polyps    benign  . Hypertension    takes Metoprolol and Lisinopril daily  . Insomnia   . Pneumonia    hx of > 5 yrs ago  . Pulmonary nodule 08/07/2016  . Sinusitis, acute 01/26/2016  . Vaginitis 10/30/2016  . Vitamin D deficiency 04/24/2016    Past Surgical History:  Procedure Laterality Date  . ABDOMINAL HYSTERECTOMY  1996  . BREAST BIOPSY    . CHOLECYSTECTOMY N/A 09/06/2015   Procedure: LAPAROSCOPIC CHOLECYSTECTOMY;  Surgeon: Ralene Ok, MD;  Location: [facility];  Service: General;  Laterality: N/A;  . COLONOSCOPY WITH ESOPHAGOGASTRODUODENOSCOPY (EGD)    . KNEE ARTHROSCOPY Bilateral   . LEFT HEART CATH AND CORONARY  ANGIOGRAPHY N/A 12/27/2016   Procedure: LEFT HEART CATH AND CORONARY ANGIOGRAPHY;  Surgeon: Sherren Mocha, MD;  Location: [facility];  Service: Cardiovascular;  Laterality: N/A;  . TONSILLECTOMY    . TUBAL LIGATION      Family History  Problem Relation Age of Onset  . Hypertension Mother   . Hyperlipidemia Mother   . Leukemia Mother   . Cancer Mother   . Hypertension Father   . Hyperlipidemia Father   . Diabetes Father   . Heart disease Father   . Alzheimer's disease Father   . Multiple sclerosis Son   . Heart disease Maternal Grandfather     Social History   Socioeconomic History  . Marital status: Widowed    Spouse name: Not on file  . Number of children: Not on file  . Years of education: Not on file  . Highest education level: Not on file  Occupational History  . Occupation: Therapist, art  Social Needs  . Financial resource strain: Not on file  . Food insecurity:    Worry: Not on file    Inability: Not on file  . Transportation needs:    Medical: Not on file    Non-medical: Not on file  Tobacco Use  . Smoking status: Never Smoker  . Smokeless tobacco: Never Used  Substance and Sexual Activity  . Alcohol use: No  Alcohol/week: 0.0 oz  . Drug use: No  . Sexual activity: Never    Comment: lives alone, widowed in 2006  Lifestyle  . Physical activity:    Days per week: Not on file    Minutes per session: Not on file  . Stress: Not on file  Relationships  . Social connections:    Talks on phone: Not on file    Gets together: Not on file    Attends religious service: Not on file    Active member of club or organization: Not on file    Attends meetings of clubs or organizations: Not on file    Relationship status: Not on file  . Intimate partner violence:    Fear of current or ex partner: Not on file    Emotionally abused: Not on file    Physically abused: Not on file    Forced sexual activity: Not on file  Other Topics Concern  . Not on  file  Social History Narrative  . Not on file    Outpatient Medications Prior to Visit  Medication Sig Dispense Refill  . amLODipine (NORVASC) 5 MG tablet Take 1 tablet (5 mg total) by mouth daily. 180 tablet 3  . aspirin EC 81 MG tablet Take 1 tablet (81 mg total) by mouth daily.    . B-D ULTRAFINE III SHORT PEN 31G X 8 MM MISC USE THREE TIMES DAILY 100 each 1  . bacitracin-polymyxin b, ophth, (POLYSPORIN) OINT Place 1 application into both eyes every 12 (twelve) hours. 3.5 g 0  . Blood Glucose Monitoring Suppl (ONE TOUCH ULTRA SYSTEM KIT) w/Device KIT Use as directed twice daily to check blood sugar.  DX E11.9 1 each 0  . furosemide (LASIX) 20 MG tablet Take 20 mg by mouth daily.    Marland Kitchen glimepiride (AMARYL) 4 MG tablet Take 1 tablet (4 mg total) by mouth 2 (two) times daily. 60 tablet 3  . insulin aspart (NOVOLOG) 100 UNIT/ML injection Inject 10 Units into the skin 3 (three) times daily before meals. 495 mL 2  . Insulin Glargine (LANTUS SOLOSTAR) 100 UNIT/ML Solostar Pen 6 mg SQ daily x 7 days then increas to 1.2 mg QC daily 5 pen PRN  . insulin lispro (HUMALOG KWIKPEN) 100 UNIT/ML KiwkPen Inject 0.1 mLs (10 Units total) into the skin 3 (three) times daily. 495 mL 0  . liraglutide (VICTOZA) 18 MG/3ML SOPN 6 mg [place] daily x  7 days and then increase to 1.2 mg sq daily 4 pen 5  . lisinopril (PRINIVIL,ZESTRIL) 20 MG tablet Take 1 tablet (20 mg total) by mouth 2 (two) times daily. 60 tablet 3  . metoprolol tartrate (LOPRESSOR) 50 MG tablet Take 1 tablet (50 mg total) by mouth 2 (two) times daily. 60 tablet 6  . nitroGLYCERIN (NITROSTAT) 0.4 MG SL tablet Place 1 tablet (0.4 mg total) under the tongue every 5 (five) minutes as needed for chest pain. 25 tablet 3  . nystatin-triamcinolone ointment (MYCOLOG) Apply 1 application topically 2 (two) times daily. 30 g 0  . ofloxacin (OCUFLOX) 0.3 % ophthalmic solution Place 1 drop into the right eye 4 (four) times daily. 5 mL 1  . ONE TOUCH ULTRA TEST test  strip USE AS DIRECTED TWICE A DAY 100 each 2  . ONETOUCH DELICA LANCETS 03K MISC Use as directed twice daily to check blood sugar.  DX E11.9 100 each 5  . ONETOUCH VERIO test strip TEST 2 TIMES DAILY 100 each 12  . rosuvastatin (  CRESTOR) 10 MG tablet Take 1 tablet (10 mg total) daily by mouth. 30 tablet 6  . Vitamin D, Ergocalciferol, (DRISDOL) 50000 units CAPS capsule TAKE 1 CAPSULE BY MOUTH EVERY 7 DAYS 12 capsule 0   No facility-administered medications prior to visit.     Allergies  Allergen Reactions  . Losartan Palpitations  . Lexapro [Escitalopram] Other (See Comments)    swelling  . Sulfa Antibiotics Hives and Rash    "Burning" rash    Review of Systems  Constitutional: Negative for fever and malaise/fatigue.  HENT: Negative for congestion.   Eyes: Negative for blurred vision.  Respiratory: Negative for shortness of breath.   Cardiovascular: Negative for chest pain, palpitations and leg swelling.  Gastrointestinal: Negative for abdominal pain, blood in stool and nausea.  Genitourinary: Negative for dysuria and frequency.  Musculoskeletal: Negative for falls.  Skin: Negative for rash.  Neurological: Negative for dizziness, loss of consciousness and headaches.  Endo/Heme/Allergies: Negative for environmental allergies.  Psychiatric/Behavioral: Negative for depression. The patient is not nervous/anxious.        Objective:    Physical Exam  Constitutional: She is oriented to person, place, and time. She appears well-developed and well-nourished. No distress.  HENT:  Head: Normocephalic and atraumatic.  Nose: Nose normal.  Eyes: Right eye exhibits no discharge. Left eye exhibits no discharge.  Neck: Normal range of motion. Neck supple.  Cardiovascular: Normal rate and regular rhythm.  No murmur heard. Pulmonary/Chest: Effort normal and breath sounds normal.  Abdominal: Soft. Bowel sounds are normal. There is no tenderness.  Musculoskeletal: She exhibits no edema.    Neurological: She is alert and oriented to person, place, and time.  Skin: Skin is warm and dry.  Psychiatric: She has a normal mood and affect.  Nursing note and vitals reviewed.   BP (!) 142/84 (BP Location: Left Arm, Patient Position: Sitting, Cuff Size: Normal)   Pulse (!) 110   Temp 98.4 F (36.9 C) (Oral)   Resp 18   Wt 274 lb (124.3 kg)   LMP 01/29/1994   SpO2 96%   BMI 50.12 kg/m  Wt Readings from Last 3 Encounters:  07/30/17 274 lb (124.3 kg)  06/13/17 273 lb (123.8 kg)  03/19/17 273 lb (123.8 kg)     Lab Results  Component Value Date   WBC 9.0 06/13/2017   HGB 14.0 06/13/2017   HCT 41.8 06/13/2017   PLT 192.0 06/13/2017   GLUCOSE 223 (H) 06/13/2017   CHOL 147 06/13/2017   TRIG 135.0 06/13/2017   HDL 65.10 06/13/2017   LDLCALC 55 06/13/2017   ALT 38 (H) 06/13/2017   AST 41 (H) 06/13/2017   NA 138 06/13/2017   K 3.9 06/13/2017   CL 101 06/13/2017   CREATININE 0.98 06/13/2017   BUN 15 06/13/2017   CO2 27 06/13/2017   TSH 1.50 06/13/2017   INR 1.1 12/25/2016   HGBA1C 9.0 (H) 05/16/2017   MICROALBUR 2.6 (H) 12/09/2015    Lab Results  Component Value Date   TSH 1.50 06/13/2017   Lab Results  Component Value Date   WBC 9.0 06/13/2017   HGB 14.0 06/13/2017   HCT 41.8 06/13/2017   MCV 88.7 06/13/2017   PLT 192.0 06/13/2017   Lab Results  Component Value Date   NA 138 06/13/2017   K 3.9 06/13/2017   CO2 27 06/13/2017   GLUCOSE 223 (H) 06/13/2017   BUN 15 06/13/2017   CREATININE 0.98 06/13/2017   BILITOT 0.5 06/13/2017   ALKPHOS  97 06/13/2017   AST 41 (H) 06/13/2017   ALT 38 (H) 06/13/2017   PROT 6.2 06/13/2017   ALBUMIN 3.7 06/13/2017   CALCIUM 9.2 06/13/2017   ANIONGAP 9 03/05/2016   GFR 61.28 06/13/2017   Lab Results  Component Value Date   CHOL 147 06/13/2017   Lab Results  Component Value Date   HDL 65.10 06/13/2017   Lab Results  Component Value Date   LDLCALC 55 06/13/2017   Lab Results  Component Value Date   TRIG  135.0 06/13/2017   Lab Results  Component Value Date   CHOLHDL 2 06/13/2017   Lab Results  Component Value Date   HGBA1C 9.0 (H) 05/16/2017       Assessment & Plan:   Problem List Items Addressed This Visit    HTN (hypertension)    Denies CP/palp/SOB/HA/congestion/fevers/GI or GU c/o. Taking meds as prescribed      Diabetes mellitus type 2 in obese ([facility])    hgba1c acceptable, minimize simple carbs. Increase exercise as tolerated. Continue current meds      Hyperlipidemia    Encouraged heart healthy diet, increase exercise, avoid trans fats, consider a krill oil cap daily      Hordeolum externum of right lower eyelid - Primary    Has not responded to otitis drops or ointment. Has tried moist compresses. She will be started on an oral antibiotic and she is referred to opthamology      Relevant Medications   amoxicillin-clavulanate (AUGMENTIN) 875-125 MG tablet   Other Relevant Orders   Ambulatory referral to Ophthalmology      I am having Angellica L. Pergola start on amoxicillin-clavulanate. I am also having her maintain her [place] KIT, ONE TOUCH ULTRA TEST, ONETOUCH DELICA LANCETS 03P, glimepiride, lisinopril, nitroGLYCERIN, aspirin EC, rosuvastatin, nystatin-triamcinolone ointment, B-D ULTRAFINE III SHORT PEN, amLODipine, ONETOUCH VERIO, furosemide, metoprolol tartrate, ofloxacin, liraglutide, Insulin Glargine, insulin lispro, insulin aspart, Vitamin D (Ergocalciferol), bacitracin-polymyxin b (ophth), and acyclovir.  Meds ordered this encounter  Medications  . acyclovir (ZOVIRAX) 400 MG tablet    Sig: Take 1 tablet (400 mg total) by mouth 5 (five) times daily.    Dispense:  35 tablet    Refill:  0  . amoxicillin-clavulanate (AUGMENTIN) 875-125 MG tablet    Sig: Take 1 tablet by mouth 2 (two) times daily.    Dispense:  20 tablet    Refill:  0     Penni Homans, MD

## 2017-08-04 NOTE — Assessment & Plan Note (Signed)
Has not responded to otitis drops or ointment. Has tried moist compresses. She will be started on an oral antibiotic and she is referred to opthamology

## 2017-08-04 NOTE — Assessment & Plan Note (Signed)
Encouraged heart healthy diet, increase exercise, avoid trans fats, consider a krill oil cap daily 

## 2017-08-04 NOTE — Assessment & Plan Note (Signed)
hgba1c acceptable, minimize simple carbs. Increase exercise as tolerated. Continue current meds 

## 2017-08-08 ENCOUNTER — Encounter: Payer: Self-pay | Admitting: Family Medicine

## 2017-08-08 ENCOUNTER — Other Ambulatory Visit: Payer: Self-pay | Admitting: Family Medicine

## 2017-08-08 MED ORDER — FLUCONAZOLE 150 MG PO TABS: 150.0000 mg | ORAL_TABLET | ORAL | 0 refills | Status: DC

## 2017-08-18 ENCOUNTER — Other Ambulatory Visit: Payer: Self-pay | Admitting: Family Medicine

## 2017-08-18 DIAGNOSIS — E1169 Type 2 diabetes mellitus with other specified complication: Secondary | ICD-10-CM

## 2017-08-18 DIAGNOSIS — E669 Obesity, unspecified: Principal | ICD-10-CM

## 2017-08-19 NOTE — Telephone Encounter (Signed)
OK to change her to Novolog vial with same sig and supplies if she agrees

## 2017-08-20 MED ORDER — "SYRINGE/NEEDLE (DISP) 27G X 1-1/2"" 10 ML MISC": 3 refills | Status: DC

## 2017-08-20 MED ORDER — INSULIN ASPART 100 UNIT/ML ~~LOC~~ SOLN: 10.0000 [IU] | Freq: Three times a day (TID) | SUBCUTANEOUS | 2 refills | Status: DC

## 2017-09-12 ENCOUNTER — Ambulatory Visit: Payer: BLUE CROSS/BLUE SHIELD | Admitting: Family Medicine

## 2017-09-12 ENCOUNTER — Other Ambulatory Visit: Payer: Self-pay | Admitting: Family Medicine

## 2017-09-29 ENCOUNTER — Other Ambulatory Visit: Payer: Self-pay | Admitting: Family Medicine

## 2017-10-30 ENCOUNTER — Other Ambulatory Visit: Payer: Self-pay | Admitting: Family Medicine

## 2017-10-30 ENCOUNTER — Encounter: Payer: Self-pay | Admitting: Family Medicine

## 2017-10-30 DIAGNOSIS — Z9189 Other specified personal risk factors, not elsewhere classified: Principal | ICD-10-CM

## 2017-10-30 DIAGNOSIS — IMO0002 Reserved for concepts with insufficient information to code with codable children: Secondary | ICD-10-CM

## 2017-11-11 ENCOUNTER — Other Ambulatory Visit: Payer: Self-pay | Admitting: Family Medicine

## 2017-11-28 ENCOUNTER — Encounter

## 2017-11-28 ENCOUNTER — Ambulatory Visit: Payer: No Typology Code available for payment source | Admitting: Podiatry

## 2017-12-12 ENCOUNTER — Ambulatory Visit: Payer: BLUE CROSS/BLUE SHIELD | Admitting: Family Medicine

## 2017-12-12 ENCOUNTER — Other Ambulatory Visit: Payer: Self-pay

## 2017-12-12 ENCOUNTER — Encounter: Payer: Self-pay | Admitting: Family Medicine

## 2017-12-12 VITALS — BP 144/90 | HR 81 | Temp 97.6°F | Ht 62.0 in | Wt 272.8 lb

## 2017-12-12 DIAGNOSIS — J0100 Acute maxillary sinusitis, unspecified: Secondary | ICD-10-CM

## 2017-12-12 MED ORDER — AMOXICILLIN-POT CLAVULANATE 875-125 MG PO TABS: 1.0000 | ORAL_TABLET | Freq: Two times a day (BID) | ORAL | 0 refills | Status: DC

## 2017-12-12 NOTE — Progress Notes (Signed)
Subjective   CC:  Chief Complaint  Patient presents with  . Ear Pain    drainage going down back of throat, Right Ear Pain     HPI: Stephanie Cordova is a 62 y.o. female who presents to the office today to address the problems listed above in the chief complaint. Same day acute visit; PCP not available. New pt to me. Chart reviewed.  "I think I have a sinus infection"  Patient reports sinus congestion and pressure with thick drainage (green and bloody), mild nonproductive cough, ear pressure without pain, and mild malaise.  Symptoms have been present for one week and are worsening.Shedenies high fevers, GI symptoms, shortness of breath. Shehas had sinus infections in the past and this feels similar.  Patient is a non-smoker.  No history of asthma or COPD. She is an uncontrolled diabetic and is overdue for a visit with her pcp. Lab Results  Component Value Date   HGBA1C 9.0 (H) 05/16/2017   HGBA1C 7.9 (H) 08/07/2016   HGBA1C 9.0 (H) 04/24/2016      I reviewed the patients updated PMH, FH, and SocHx.    Patient Active Problem List   Diagnosis Date Noted  . Hordeolum externum of right lower eyelid 08/04/2017  . Shortness of breath 12/27/2016  . Exertional chest pain   . Angina pectoris ([place]) 12/04/2016  . Obstructive hypertrophic cardiomyopathy ([place]) 12/04/2016  . Atypical chest pain 11/25/2016  . Cold sore 11/04/2016  . Vaginitis 10/30/2016  . Abnormal liver function tests 08/07/2016  . Pulmonary nodule 08/07/2016  . Vitamin D deficiency 04/24/2016  . Hyperlipidemia 04/24/2016  . Trigger finger, acquired 11/16/2015  . Left knee pain 11/16/2015  . RUQ pain 08/09/2015  . Skin lesion of right lower extremity 08/09/2015  . Snoring 06/29/2014  . Cephalalgia 06/19/2014  . Restless sleeper 06/19/2014  . Diabetes mellitus type 2 in obese ([place]) 12/21/2012  . Cirrhosis of liver ([place]) 12/21/2012  . Preventative health care 11/30/2012  . Personal history of colonic polyps  11/30/2012  . HTN (hypertension) 11/30/2012  . Insomnia   . Obesity   . Hot flash, menopausal   . Arthritis   . Allergy    Current Meds  Medication Sig  . B-D ULTRAFINE III SHORT PEN 31G X 8 MM MISC USE 3 TIMES A DAY  . Blood Glucose Monitoring Suppl (ONE TOUCH ULTRA SYSTEM KIT) w/Device KIT Use as directed twice daily to check blood sugar.  DX E11.9  . furosemide (LASIX) 20 MG tablet Take 20 mg by mouth daily.  Marland Kitchen HUMALOG KWIKPEN 100 UNIT/ML KiwkPen INJECT 10 UNITS INTO THE SKIN 3 TIMES DAILY  . lisinopril (PRINIVIL,ZESTRIL) 20 MG tablet Take 1 tablet (20 mg total) by mouth 2 (two) times daily.  . metoprolol tartrate (LOPRESSOR) 50 MG tablet Take 1 tablet (50 mg total) by mouth 2 (two) times daily.  . ONE TOUCH ULTRA TEST test strip USE AS DIRECTED TWICE A DAY  . ONETOUCH DELICA LANCETS 07H MISC Use as directed twice daily to check blood sugar.  DX E11.9  . Syringe/Needle, Disp, (SAFETY SYRINGES/NEEDLE) 27G X 1-1/2" 10 ML MISC Inject 10 units into the skin 3 times daily before meals Dz:E11.69    Review of Systems: Cardiovascular: negative for chest pain Respiratory: negative for SOB or persistent cough Gastrointestinal: negative for abdominal pain Genitourinary: negative for dysuria or gross hematuria  Objective  Vitals: BP (!) 144/90   Pulse 81   Temp 97.6 F (36.4 C)  Ht _0  (1.575 m)   Wt 272 lb 12.8 oz (123.7 kg)   LMP 01/29/1994   SpO2 97%   BMI 49.90 kg/m  General: no acute distress  Psych:  Alert and oriented, normal mood and affect HEENT:  Normocephalic, atraumatic, TMs with serous effusions or retraction w/o erythema, nasal mucosa is red with purulent drainage, tender maxillary sinus present, OP mild erythematous w/o eudate, supple neck with LAD Cardiovascular:  RRR without murmur or gallop. no peripheral edema Respiratory:  Good breath sounds bilaterally, CTAB with normal respiratory effort Skin:  Warm, no rashes Neurologic:   Mental status is normal. normal  gait  Assessment  1. Acute non-recurrent maxillary sinusitis      Plan    Sinusitis: History and exam is most consistent with bacterial sinus infection.  Etiology and prognosis discussed with patient.  Recommend antibiotics as ordered below.  Patient to complete course of antibiotics, use supportive medications like mucolytics and decongestants as needed.  May use Tylenol or Advil if needed.  Symptoms should improve over the next 2 weeks.  Patient will return or call if symptoms persist or worsen.  Follow up: prn and recommended making a DM f/u visit with Dr. Yancey Flemings    Commons side effects, risks, benefits, and alternatives for medications and treatment plan prescribed today were discussed, and the patient expressed understanding of the given instructions. Patient is instructed to call or message via MyChart if he/she has any questions or concerns regarding our treatment plan. No barriers to understanding were identified. We discussed Red Flag symptoms and signs in detail. Patient expressed understanding regarding what to do in case of urgent or emergency type symptoms.   Medication list was reconciled, printed and provided to the patient in AVS. Patient instructions and summary information was reviewed with the patient as documented in the AVS. This note was prepared with assistance of Dragon voice recognition software. Occasional wrong-word or sound-a-like substitutions may have occurred due to the inherent limitations of voice recognition software  No orders of the defined types were placed in this encounter.  Meds ordered this encounter  Medications  . amoxicillin-clavulanate (AUGMENTIN) 875-125 MG tablet    Sig: Take 1 tablet by mouth 2 (two) times daily.    Dispense:  14 tablet    Refill:  0

## 2017-12-12 NOTE — Patient Instructions (Signed)
Please follow up if symptoms do not improve or as needed.  Take your antibiotics and Mucinex DM. You may use advil as well.   Sinusitis, Adult Sinusitis is soreness and inflammation of your sinuses. Sinuses are hollow spaces in the bones around your face. They are located:  Around your eyes.  In the middle of your forehead.  Behind your nose.  In your cheekbones.  Your sinuses and nasal passages are lined with a stringy fluid (mucus). Mucus normally drains out of your sinuses. When your nasal tissues get inflamed or swollen, the mucus can get trapped or blocked so air cannot flow through your sinuses. This lets bacteria, viruses, and funguses grow, and that leads to infection. Follow these instructions at home: Medicines  Take, use, or apply over-the-counter and prescription medicines only as told by your doctor. These may include nasal sprays.  If you were prescribed an antibiotic medicine, take it as told by your doctor. Do not stop taking the antibiotic even if you start to feel better. Hydrate and Humidify  Drink enough water to keep your pee (urine) clear or pale yellow.  Use a cool mist humidifier to keep the humidity level in your home above 50%.  Breathe in steam for 10-15 minutes, 3-4 times a day or as told by your doctor. You can do this in the bathroom while a hot shower is running.  Try not to spend time in cool or dry air. Rest  Rest as much as possible.  Sleep with your head raised (elevated).  Make sure to get enough sleep each night. General instructions  Put a warm, moist washcloth on your face 3-4 times a day or as told by your doctor. This will help with discomfort.  Wash your hands often with soap and water. If there is no soap and water, use hand sanitizer.  Do not smoke. Avoid being around people who are smoking (secondhand smoke).  Keep all follow-up visits as told by your doctor. This is important. Contact a doctor if:  You have a fever.  Your  symptoms get worse.  Your symptoms do not get better within 10 days. Get help right away if:  You have a very bad headache.  You cannot stop throwing up (vomiting).  You have pain or swelling around your face or eyes.  You have trouble seeing.  You feel confused.  Your neck is stiff.  You have trouble breathing. This information is not intended to replace advice given to you by your health care provider. Make sure you discuss any questions you have with your health care provider. Document Released: 07/04/2007 Document Revised: 09/11/2015 Document Reviewed: 11/10/2014 Elsevier Interactive Patient Education  [employer]2018 [employer]

## 2017-12-13 ENCOUNTER — Ambulatory Visit: Payer: BLUE CROSS/BLUE SHIELD | Admitting: Family Medicine

## 2017-12-16 ENCOUNTER — Encounter: Payer: Self-pay | Admitting: Family Medicine

## 2017-12-17 ENCOUNTER — Other Ambulatory Visit: Payer: Self-pay | Admitting: Family Medicine

## 2017-12-17 ENCOUNTER — Encounter: Payer: Self-pay | Admitting: Family Medicine

## 2017-12-17 MED ORDER — ACYCLOVIR 400 MG PO TABS: 400.0000 mg | ORAL_TABLET | Freq: Every day | ORAL | 0 refills | Status: DC

## 2017-12-19 ENCOUNTER — Encounter: Payer: Self-pay | Admitting: Family Medicine

## 2017-12-22 ENCOUNTER — Other Ambulatory Visit: Payer: Self-pay | Admitting: Family Medicine

## 2017-12-22 MED ORDER — VALACYCLOVIR HCL 500 MG PO TABS: 1000.0000 mg | ORAL_TABLET | Freq: Two times a day (BID) | ORAL | 1 refills | Status: AC

## 2017-12-28 ENCOUNTER — Other Ambulatory Visit: Payer: Self-pay | Admitting: Family Medicine

## 2017-12-30 NOTE — Telephone Encounter (Signed)
Continue Ergocalciferol?

## 2018-01-06 IMAGING — CT CT ABD-PELV W/ CM
2 of 5 series · 15 of 46 positions shown, 17 images · IV contrast (APPLIED)
Comparison: Current right upper quadrant ultrasound.

CLINICAL DATA: Pt c/o RLQ and RUQ abdominal pain this morning, Last
night it was more in her back. Some nausea. Pt states she has some
chills, hx of HTN, diabetes, hx of hysterectomy, cholecystectomy, no
other complaints

EXAM:
CT ABDOMEN AND PELVIS WITH CONTRAST
TECHNIQUE: Multidetector CT imaging of the abdomen and pelvis was performed
using the standard protocol following bolus administration of
intravenous contrast.
CONTRAST:  100mL [id number] IOPAMIDOL ([id number]) INJECTION 61%

[Series 2: axial st · axial · 0.98mm/px · z∈[-482,-42]mm · 12 of 100 slices shown, 14 images]
[im 6/100  soft-tissue]
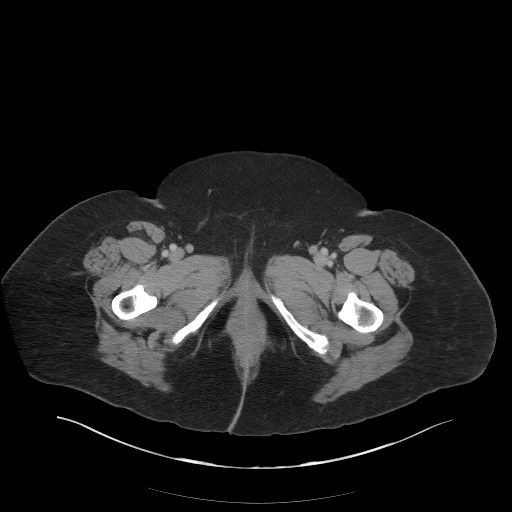
[im 6/100  bone]
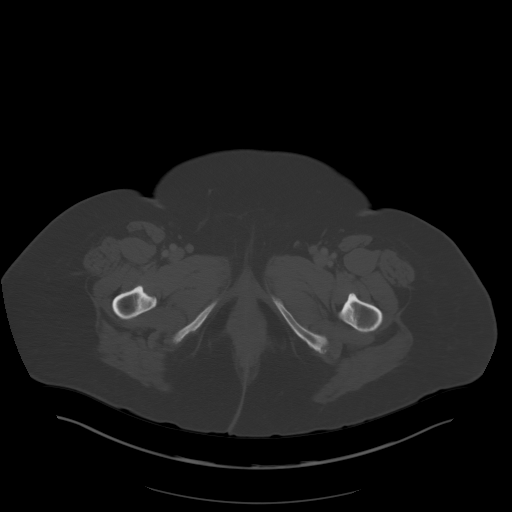
[im 17/100  soft-tissue]
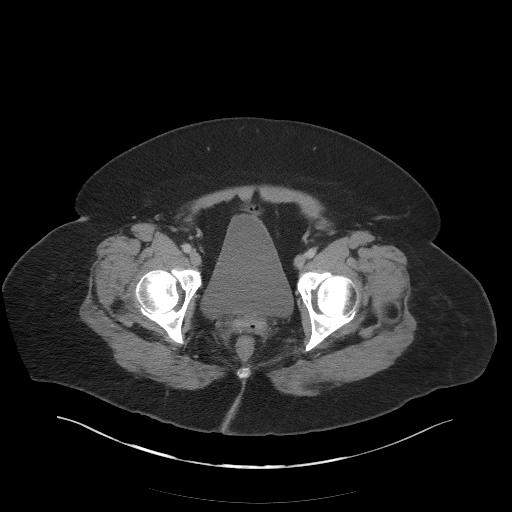
[im 23/100  soft-tissue]
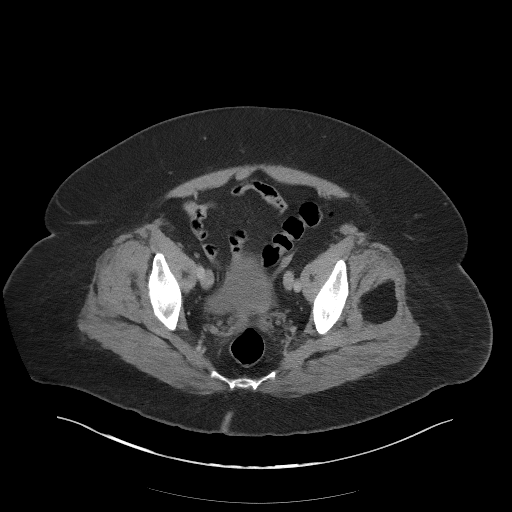
[im 28/100  soft-tissue]
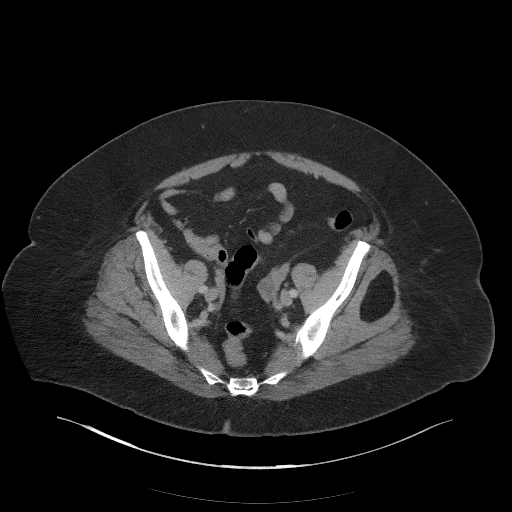
[im 39/100  soft-tissue]
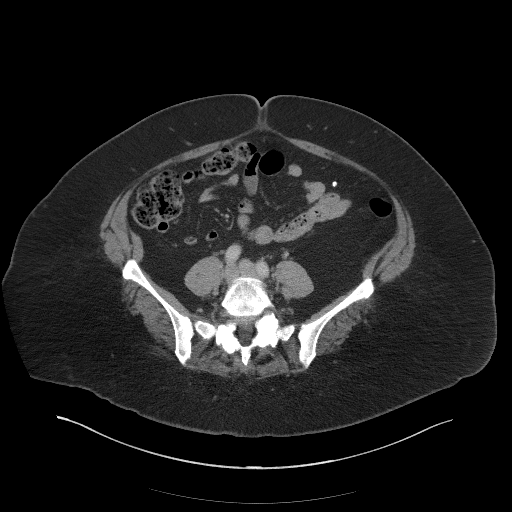
[im 45/100  soft-tissue]
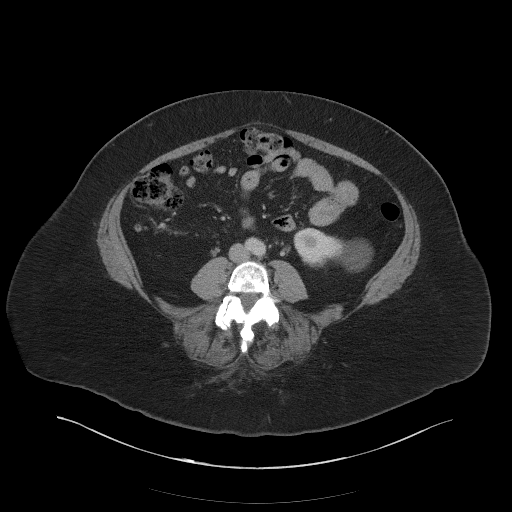
[im 56/100  soft-tissue]
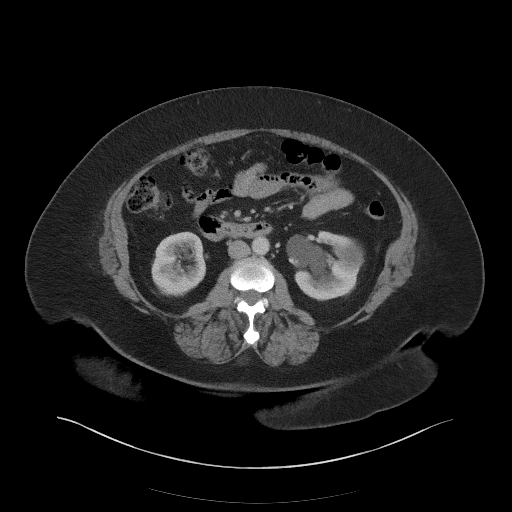
[im 61/100  soft-tissue]
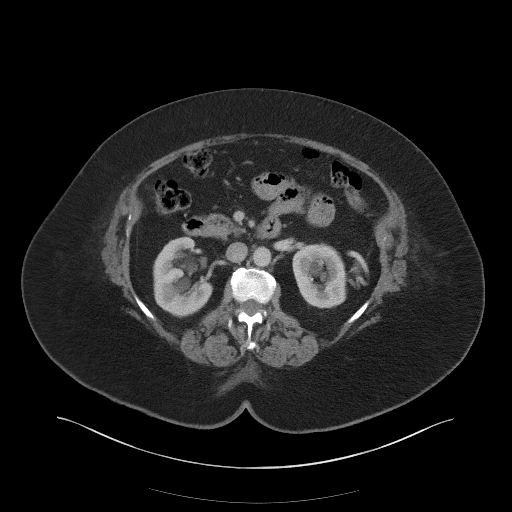
[im 72/100  soft-tissue]
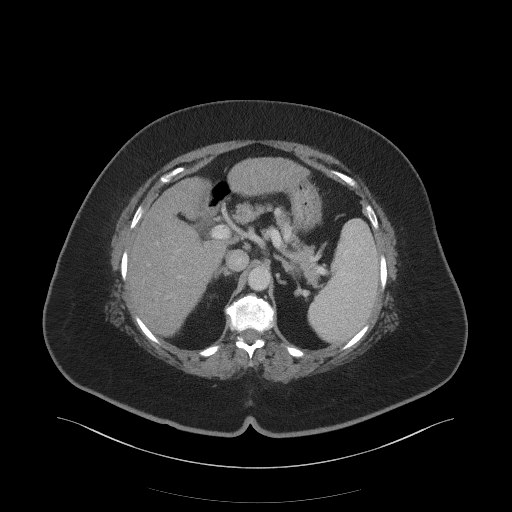
[im 72/100  bone]
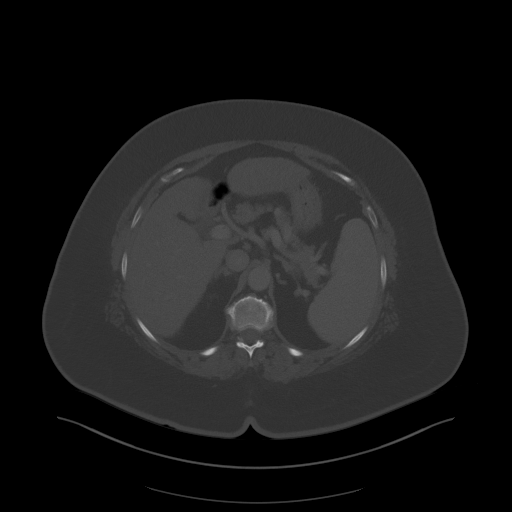
[im 78/100  soft-tissue]
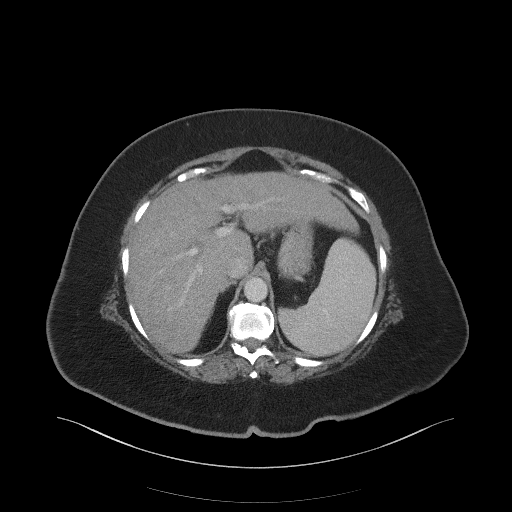
[im 83/100  soft-tissue]
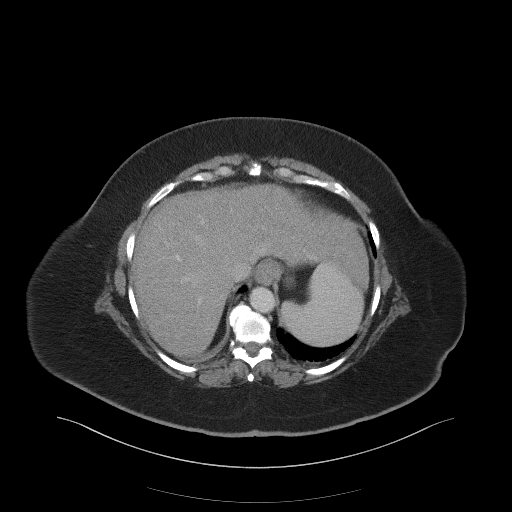
[im 94/100  soft-tissue]
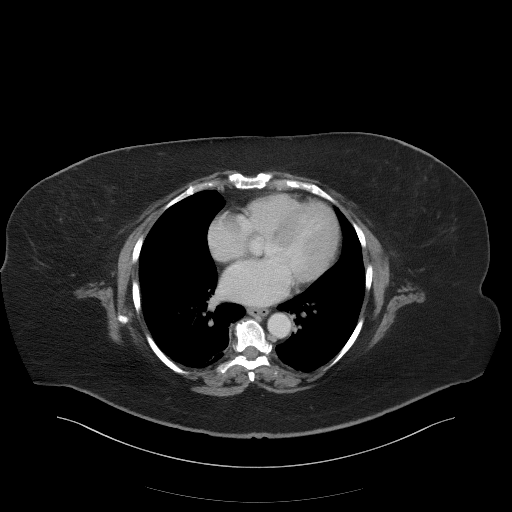

[Series 5: coronal st · coronal · 0.92mm/px · 3 of 118 slices shown]
[im 40/118  soft-tissue]
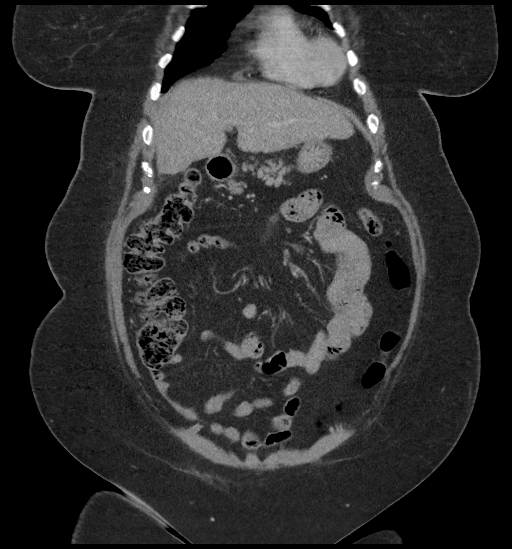
[im 53/118  soft-tissue]
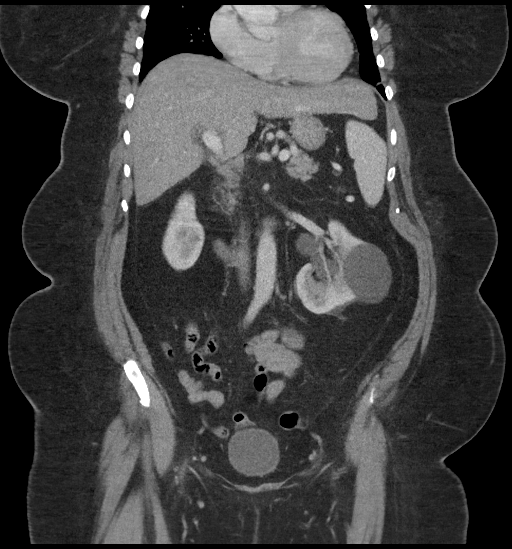
[im 66/118  soft-tissue]
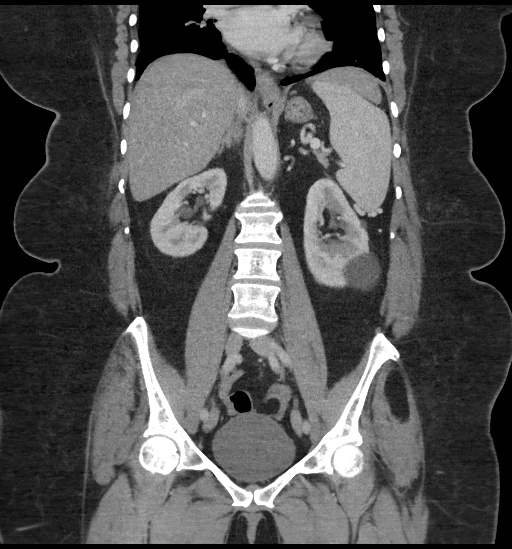

[15 of 46 positions shown; findings below may reference images not displayed]

FINDINGS: Lower chest: No acute findings. 5 mm nodule in the right middle
lobe. Minimal right pleural effusion. Heart is mildly enlarged.

Hepatobiliary: Liver shows morphologic changes that suggests
cirrhosis, as noted on the current ultrasound. There is central
volume loss with relative enlargement of the lateral segment of the
left lobe. Attenuation is heterogeneous with subtle ill-defined
areas of hypoattenuation throughout the liver, but no defined
measurable mass. Gallbladder is surgically absent. No bile duct
dilation.

Pancreas: Unremarkable. No pancreatic ductal dilatation or
surrounding inflammatory changes.

Spleen: Mildly enlarged measuring 13.1 x 6.0 x 12.3 cm. No splenic
mass or focal lesion.

Adrenals/Urinary Tract: 12 mm nonspecific left adrenal mass with
average Hounsfield units of 50. Normal right adrenal gland.

Bilateral renal sinus cysts. 6.2 cm lower pole left renal cortical
cyst. 1 cm midpole left renal cortical cyst. Subcentimeter cortical
low-density mass from the midpole the right kidney, also likely a
cyst. No other masses, no stones and no hydronephrosis. Normal
ureters. Normal bladder.

Stomach/Bowel: Normal stomach and small bowel. There are few colonic
diverticula. No diverticulitis. Colon otherwise unremarkable. Normal
appendix visualized.

Vascular/Lymphatic: No significant vascular findings are present. No
enlarged abdominal or pelvic lymph nodes.

Reproductive: Status post hysterectomy. No adnexal masses.

Other: No abdominal wall hernia or abnormality. No abdominopelvic
ascites.

Musculoskeletal: Left gluteus medius lipoma measuring 10 x 3.3 x
cm. No fracture or acute finding. No osteoblastic or osteolytic
lesions.
IMPRESSION: 1. No acute abnormalities within the abdomen pelvis.
2. As noted on ultrasound, findings support cirrhosis. Mild portal
venous hypertension is suggested by mild splenomegaly.
3. Liver also shows a somewhat mottled appearance with ill-defined
areas of hypoattenuation throughout the liver without a discrete
mass. Consider further imaging assessment with liver MRI with and
without contrast.
4. 5 mm nodule in the right middle lobe. No follow-up needed if
patient is low-risk. Non-contrast chest CT can be considered in 12
months if patient is high-risk. This recommendation follows the
consensus statement: Guidelines for Management of Incidental
Pulmonary Nodules Detected on CT Images: From the [HOSPITAL]
5. Renal cysts.

## 2018-01-31 ENCOUNTER — Other Ambulatory Visit: Payer: Self-pay | Admitting: Endocrinology

## 2018-02-03 ENCOUNTER — Other Ambulatory Visit: Payer: Self-pay | Admitting: Family Medicine

## 2018-02-03 NOTE — Telephone Encounter (Signed)
Copied from CRM [id number]. Topic: Quick Communication - Rx Refill/Question >> Feb 03, 2018  1:41 PM Gerrianne Scale wrote: Medication: HUMALOG KWIKPEN 100 UNIT/ML KiwkPen  Pt is out of this medicine she didn't have any yesterday   Has the patient contacted their pharmacy? Yes.   (Agent: If no, request that the patient contact the pharmacy for the refill.) (Agent: If yes, when and what did the pharmacy advise?) Friday they sent it to wrong provider told her to call DR Abner Greenspan office   Preferred Pharmacy (with phone number or street name): [id number] - [place], [place] - [address] [phone] (Phone) [phone] (Fax)    Agent: Please be advised that RX refills may take up to 3 business days. We ask that you follow-up with your pharmacy.

## 2018-02-04 MED ORDER — INSULIN LISPRO (1 UNIT DIAL) 100 UNIT/ML (KWIKPEN): PEN_INJECTOR | SUBCUTANEOUS | 0 refills | Status: DC

## 2018-02-04 NOTE — Telephone Encounter (Signed)
Attempted to call patient and verify which pharmacy she is using for the humalog kwikpen. There are 2 pharmacies listed.  No answer, left message to call back and verify.

## 2018-02-04 NOTE — Telephone Encounter (Signed)
Pt verified [facility] [phone] to use for this refill.  Medication does not have a protocol. Please review for refill.

## 2018-03-29 ENCOUNTER — Other Ambulatory Visit: Payer: Self-pay | Admitting: Family Medicine

## 2019-04-08 ENCOUNTER — Other Ambulatory Visit: Payer: Self-pay | Admitting: Family Medicine

## 2019-04-08 NOTE — Telephone Encounter (Signed)
Great pick up I have not seen her in almost 2 years. She can take over the counter vitamin D daily until she gets tested again. If she wants to continue her care with [place] she will need labs and at least a virtual visit.

## 2019-04-08 NOTE — Telephone Encounter (Signed)
Can take OTC Vit-Marchelle Rinella still seen at visit per SB/thx dmf

## 2019-04-08 NOTE — Telephone Encounter (Signed)
SB-Last labs completed 5.2019 although she has been seen this year/plz advise about refill/thx dmf

## 2019-12-01 ENCOUNTER — Other Ambulatory Visit: Payer: Self-pay | Admitting: Family Medicine

## 2019-12-01 DIAGNOSIS — M79605 Pain in left leg: Secondary | ICD-10-CM

## 2019-12-01 DIAGNOSIS — M79604 Pain in right leg: Secondary | ICD-10-CM

## 2021-05-23 ENCOUNTER — Ambulatory Visit: Payer: Medicare Other | Admitting: Podiatry

## 2021-09-12 ENCOUNTER — Other Ambulatory Visit: Payer: Self-pay | Admitting: Otolaryngology

## 2021-09-12 DIAGNOSIS — H918X9 Other specified hearing loss, unspecified ear: Secondary | ICD-10-CM

## 2021-10-10 ENCOUNTER — Ambulatory Visit
Admission: RE | Admit: 2021-10-10 | Discharge: 2021-10-10 | Disposition: A | Discharge status: Home / Self Care | Payer: Medicare Other | Source: Ambulatory Visit | Attending: Otolaryngology | Admitting: Otolaryngology

## 2021-10-10 DIAGNOSIS — H918X9 Other specified hearing loss, unspecified ear: Secondary | ICD-10-CM

## 2021-10-10 MED ORDER — GADOBENATE DIMEGLUMINE 529 MG/ML IV SOLN
16.0000 mL | Freq: Once | INTRAVENOUS | Status: AC | PRN
  Administered 2021-10-10: 16 mL via INTRAVENOUS

## 2021-10-11 ENCOUNTER — Other Ambulatory Visit: Payer: Self-pay | Admitting: Otolaryngology

## 2021-10-11 DIAGNOSIS — H918X9 Other specified hearing loss, unspecified ear: Secondary | ICD-10-CM

## 2022-02-21 ENCOUNTER — Ambulatory Visit
Admission: RE | Admit: 2022-02-21 | Discharge: 2022-02-21 | Disposition: A | Discharge status: Home / Self Care | Payer: Medicare Other | Source: Ambulatory Visit | Attending: Family Medicine | Admitting: Family Medicine

## 2022-02-21 ENCOUNTER — Other Ambulatory Visit: Payer: Self-pay | Admitting: Family Medicine

## 2022-02-21 DIAGNOSIS — R112 Nausea with vomiting, unspecified: Secondary | ICD-10-CM

## 2022-09-18 ENCOUNTER — Other Ambulatory Visit: Payer: Self-pay

## 2022-09-18 ENCOUNTER — Emergency Department (HOSPITAL_BASED_OUTPATIENT_CLINIC_OR_DEPARTMENT_OTHER)
Admission: EM | Admit: 2022-09-18 | Discharge: 2022-09-18 | Disposition: A | Discharge status: Home / Self Care | Payer: Medicare Other | Attending: Emergency Medicine | Admitting: Emergency Medicine

## 2022-09-18 DIAGNOSIS — Z79899 Other long term (current) drug therapy: Secondary | ICD-10-CM | POA: Insufficient documentation

## 2022-09-18 DIAGNOSIS — E1165 Type 2 diabetes mellitus with hyperglycemia: Secondary | ICD-10-CM | POA: Diagnosis not present

## 2022-09-18 DIAGNOSIS — Z794 Long term (current) use of insulin: Secondary | ICD-10-CM | POA: Diagnosis not present

## 2022-09-18 DIAGNOSIS — Z7984 Long term (current) use of oral hypoglycemic drugs: Secondary | ICD-10-CM | POA: Diagnosis not present

## 2022-09-18 DIAGNOSIS — R739 Hyperglycemia, unspecified: Secondary | ICD-10-CM

## 2022-09-18 DIAGNOSIS — D696 Thrombocytopenia, unspecified: Secondary | ICD-10-CM | POA: Diagnosis not present

## 2022-09-18 DIAGNOSIS — I10 Essential (primary) hypertension: Secondary | ICD-10-CM | POA: Insufficient documentation

## 2022-09-18 LAB — CBC WITH DIFFERENTIAL/PLATELET
Abs Immature Granulocytes: 0.02 10*3/uL (ref 0.00–0.07)
Basophils Absolute: 0 10*3/uL (ref 0.0–0.1)
Basophils Relative: 1 %
Eosinophils Absolute: 0.1 10*3/uL (ref 0.0–0.5)
Eosinophils Relative: 3 %
HCT: 35.3 % — ABNORMAL LOW (ref 36.0–46.0)
Hemoglobin: 12.1 g/dL (ref 12.0–15.0)
Immature Granulocytes: 0 %
Lymphocytes Relative: 18 %
Lymphs Abs: 0.9 10*3/uL (ref 0.7–4.0)
MCH: 30.1 pg (ref 26.0–34.0)
MCHC: 34.3 g/dL (ref 30.0–36.0)
MCV: 87.8 fL (ref 80.0–100.0)
Monocytes Absolute: 0.4 10*3/uL (ref 0.1–1.0)
Monocytes Relative: 7 %
Neutro Abs: 3.5 10*3/uL (ref 1.7–7.7)
Neutrophils Relative %: 71 %
Platelets: 85 10*3/uL — ABNORMAL LOW (ref 150–400)
RBC: 4.02 MIL/uL (ref 3.87–5.11)
RDW: 13.9 % (ref 11.5–15.5)
WBC: 4.9 10*3/uL (ref 4.0–10.5)
nRBC: 0 % (ref 0.0–0.2)

## 2022-09-18 LAB — BASIC METABOLIC PANEL
Anion gap: 8 (ref 5–15)
BUN: 21 mg/dL (ref 8–23)
CO2: 27 mmol/L (ref 22–32)
Calcium: 8.7 mg/dL — ABNORMAL LOW (ref 8.9–10.3)
Chloride: 101 mmol/L (ref 98–111)
Creatinine, Ser: 0.73 mg/dL (ref 0.44–1.00)
GFR, Estimated: >60 mL/min (ref >60)
Glucose, Bld: 311 mg/dL — ABNORMAL HIGH (ref 70–99)
Potassium: 4.4 mmol/L (ref 3.5–5.1)
Sodium: 136 mmol/L (ref 135–145)

## 2022-09-18 LAB — CBG MONITORING, ED
Glucose-Capillary: 283 mg/dL — ABNORMAL HIGH (ref 70–99)
Glucose-Capillary: 264 mg/dL — ABNORMAL HIGH (ref 70–99)

## 2022-09-18 MED ORDER — LACTATED RINGERS IV BOLUS
1000.0000 mL | Freq: Once | INTRAVENOUS | Status: AC
  Administered 2022-09-18: 1000 mL via INTRAVENOUS

## 2022-09-18 MED ORDER — METFORMIN HCL 500 MG PO TABS: 500.0000 mg | ORAL_TABLET | Freq: Two times a day (BID) | ORAL | 0 refills | Status: DC

## 2022-09-18 NOTE — Discharge Instructions (Addendum)
You were seen today for high blood sugar.  You are being started on metformin should follow-up with your doctor later this week.  If you develop persistently high blood sugar or any other new concerning symptoms you should return to the ED.

## 2022-09-18 NOTE — ED Triage Notes (Addendum)
Reports high CBG at home. Has used insulin in the past. Had gastric sleeve last year and has been managing with diet since. Denies N/V, CP, SOB. Vision is blurry- assumed to be from eye dilation at eye appt this afternoon.  Hypertensive in triage 200/80s- carvedilol taken at night- asymptomatic. CBG 238 in triage. Seeking insulin refill today.

## 2022-09-18 NOTE — ED Provider Notes (Signed)
[place] EMERGENCY DEPARTMENT AT [facility] Provider Note   CSN: [id number] Arrival date & time: 09/18/22  1454     History {Add pertinent medical, surgical, social history, OB history to HPI:1} Chief Complaint  Patient presents with   Hyperglycemia    Stephanie Cordova is a 67 y.o. female.   Hyperglycemia 67 year old female history of type 2 diabetes, hypertension, liver cirrhosis, prior sleeve gastrectomy presenting for hyperglycemia.  Patient states over last couple weeks she has been checking her blood sugar and is noted that it is normally been quite high.  1 time was up in the 500s usually in the 300s.  She is not checking it consistently.  She has not taken any medicine for diabetes for about 2 years after a sleeve gastrectomy.  She reports her diet has been worse lately.  She used to take insulin 40 units daily as well as glyburide.  She used to take metformin as well though it caused some upset stomach but she did not tolerate it.  She is here today because she wants to get blood sugar control.  She notes she has had some blurry vision for some time but this is not new.  No chest pain, shortness of breath.  No polyuria or polydipsia.  No fevers or chills.  She is eating and drinking normally.  She has an appoint with her doctor on Monday.     Home Medications Prior to Admission medications   Medication Sig Start Date End Date Taking? Authorizing Provider  amoxicillin-clavulanate (AUGMENTIN) 875-125 MG tablet Take 1 tablet by mouth 2 (two) times daily. 12/12/17   Willow Ora, MD  B-D ULTRAFINE III SHORT PEN 31G X 8 MM MISC USE 3 TIMES A DAY 11/12/17   Bradd Canary, MD  Blood Glucose Monitoring Suppl (ONE TOUCH ULTRA SYSTEM KIT) w/Device KIT Use as directed twice daily to check blood sugar.  DX E11.9 07/07/15   Bradd Canary, MD  furosemide (LASIX) 20 MG tablet Take 20 mg by mouth daily.    [provider]  insulin lispro (HUMALOG KWIKPEN) 100 UNIT/ML  KwikPen INJECT 10 UNITS INTO THE SKIN 3 TIMES DAILY 02/04/18   Bradd Canary, MD  lisinopril (PRINIVIL,ZESTRIL) 20 MG tablet Take 1 tablet (20 mg total) by mouth 2 (two) times daily. 11/22/16   Bradd Canary, MD  metoprolol tartrate (LOPRESSOR) 50 MG tablet Take 1 tablet (50 mg total) by mouth 2 (two) times daily. 03/19/17 12/12/17  Georgeanna Lea, MD  nitroGLYCERIN (NITROSTAT) 0.4 MG SL tablet Place 1 tablet (0.4 mg total) under the tongue every 5 (five) minutes as needed for chest pain. 11/22/16   Bradd Canary, MD  ONE TOUCH ULTRA TEST test strip USE AS DIRECTED TWICE A DAY 07/11/15   Bradd Canary, MD  [facility] DELICA LANCETS 33G MISC Use as directed twice daily to check blood sugar.  DX E11.9 03/26/16   Bradd Canary, MD  rosuvastatin (CRESTOR) 10 MG tablet Take 1 tablet (10 mg total) daily by mouth. Patient not taking: Reported on 12/12/2017 12/04/16 12/12/17  Georgeanna Lea, MD  Syringe/Needle, Disp, (SAFETY SYRINGES/NEEDLE) 27G X 1-1/2" 10 ML MISC Inject 10 units into the skin 3 times daily before meals Dz:E11.69 08/20/17   Bradd Canary, MD  Vitamin D, Ergocalciferol, (DRISDOL) 1.25 MG (50000 UT) CAPS capsule TAKE 1 CAPSULE BY MOUTH ONCE A WEEK 03/31/18   Bradd Canary, MD      Allergies  Losartan, Lexapro [escitalopram], and Sulfa antibiotics    Review of Systems   Review of Systems Review of systems completed and notable as per HPI.  ROS otherwise negative.   Physical Exam Updated Vital Signs BP (!) 211/85 (BP Location: Right Arm)   Pulse 74   Temp 98 F (36.7 C)   Resp 18   Ht 5\' 1"  (1.549 m)   Wt 88.5 kg   LMP 01/29/1994   SpO2 99%   BMI 36.84 kg/m  Physical Exam Vitals and nursing note reviewed.  Constitutional:      General: She is not in acute distress.    Appearance: She is well-developed.  HENT:     Head: Normocephalic and atraumatic.     Mouth/Throat:     Mouth: Mucous membranes are moist.     Pharynx: Oropharynx is clear.  Eyes:      Extraocular Movements: Extraocular movements intact.     Conjunctiva/sclera: Conjunctivae normal.     Pupils: Pupils are equal, round, and reactive to light.  Cardiovascular:     Rate and Rhythm: Normal rate and regular rhythm.     Heart sounds: No murmur heard. Pulmonary:     Effort: Pulmonary effort is normal. No respiratory distress.     Breath sounds: Normal breath sounds.  Abdominal:     Palpations: Abdomen is soft.     Tenderness: There is no abdominal tenderness.  Musculoskeletal:        General: No swelling.     Cervical back: Neck supple.     Right lower leg: No edema.     Left lower leg: No edema.  Skin:    General: Skin is warm and dry.     Capillary Refill: Capillary refill takes less than 2 seconds.  Neurological:     General: No focal deficit present.     Mental Status: She is alert and oriented to person, place, and time. Mental status is at baseline.  Psychiatric:        Mood and Affect: Mood normal.     ED Results / Procedures / Treatments   Labs (all labs ordered are listed, but only abnormal results are displayed) Labs Reviewed  CBG MONITORING, ED - Abnormal; Notable for the following components:      Result Value   Glucose-Capillary 283 (*)    All other components within normal limits    EKG None  Radiology No results found.  Procedures Procedures  {Document cardiac monitor, telemetry assessment procedure when appropriate:1}  Medications Ordered in ED Medications - No data to display  ED Course/ Medical Decision Making/ A&P   {   Click here for ABCD2, HEART and other calculatorsREFRESH Note before signing :1}                              Medical Decision Making Amount and/or Complexity of Data Reviewed Labs: ordered.   Medical Decision Making:   Stephanie Cordova is a 67 y.o. female who presented to the ED today with asymptomatic hyperglycemia.  Vital signs reviewed.  Exam she is well-appearing, she has had some intermittent hyperglycemia  although very variable measurements and not consistently checking at home.  She also reports some recent dietary indiscretion.  Not currently on any medication for type 2 diabetes.  She is mildly hyperglycemic here, will obtain some basic labs to evaluate renal function, rule out DKA although low suspicion for this at this time.  She is otherwise asymptomatic.  She already has follow-up on Monday.   {crccomplexity:27900} Reviewed and confirmed nursing documentation for past medical history, family history, social history.  Initial Study Results:   Laboratory  All laboratory results reviewed.  Labs notable for ***  ***EKG EKG was reviewed independently. Rate, rhythm, axis, intervals all examined and without medically relevant abnormality. ST segments without concerns for elevations.    Radiology:  All images reviewed independently. ***Agree with radiology report at this time.      Consults: Case discussed with ***.   Reassessment and Plan:   ***    Patient's presentation is most consistent with {EM COPA:27473}     {Document critical care time when appropriate:1} {Document review of labs and clinical decision tools ie heart score, Chads2Vasc2 etc:1}  {Document your independent review of radiology images, and any outside records:1} {Document your discussion with family members, caretakers, and with consultants:1} {Document social determinants of health affecting pt's care:1} {Document your decision making why or why not admission, treatments were needed:1} Final Clinical Impression(s) / ED Diagnoses Final diagnoses:  None    Rx / DC Orders ED Discharge Orders     None

## 2022-09-18 NOTE — ED Notes (Signed)
Reviewed AVS/discharge instruction with patient. Time allotted for and all questions answered. Patient is agreeable for d/c and escorted to ed exit by staff.  

## 2022-12-25 ENCOUNTER — Ambulatory Visit (INDEPENDENT_AMBULATORY_CARE_PROVIDER_SITE_OTHER): Payer: Self-pay | Admitting: Audiology

## 2022-12-25 NOTE — Progress Notes (Signed)
Patient was seen today for a hearing aid check. She currently wears a left Oticon Zircon hearing aid. Replaced the dome and wax filter, and fixed the retention line. Patient noticed an improvement in sound quality. Connected her to the software, she is not due for any software updates today. Increased her overall gain 2 steps to help with volume. She was happy with the changes. Instructed her to call if any concerns arise.   Conley Rolls Stephanie Cordova, AUD, CCC-A 12/25/22

## 2023-04-01 ENCOUNTER — Telehealth (INDEPENDENT_AMBULATORY_CARE_PROVIDER_SITE_OTHER): Payer: Self-pay | Admitting: Otolaryngology

## 2023-04-01 NOTE — Telephone Encounter (Signed)
 Confirmed appt & location [phone] [id number]

## 2023-04-02 ENCOUNTER — Ambulatory Visit (INDEPENDENT_AMBULATORY_CARE_PROVIDER_SITE_OTHER): Payer: Medicare Other | Admitting: Otolaryngology

## 2023-04-02 ENCOUNTER — Ambulatory Visit (INDEPENDENT_AMBULATORY_CARE_PROVIDER_SITE_OTHER): Admitting: Audiology

## 2023-04-02 ENCOUNTER — Ambulatory Visit (INDEPENDENT_AMBULATORY_CARE_PROVIDER_SITE_OTHER): Payer: Medicare Other | Admitting: Audiology

## 2023-04-02 VITALS — BP 177/109 | HR 70 | Ht 61.0 in | Wt 194.0 lb

## 2023-04-02 DIAGNOSIS — H903 Sensorineural hearing loss, bilateral: Secondary | ICD-10-CM

## 2023-04-02 DIAGNOSIS — H7202 Central perforation of tympanic membrane, left ear: Secondary | ICD-10-CM

## 2023-04-02 DIAGNOSIS — Z9629 Presence of other otological and audiological implants: Secondary | ICD-10-CM

## 2023-04-02 DIAGNOSIS — Z09 Encounter for follow-up examination after completed treatment for conditions other than malignant neoplasm: Secondary | ICD-10-CM

## 2023-04-02 DIAGNOSIS — Z8669 Personal history of other diseases of the nervous system and sense organs: Secondary | ICD-10-CM | POA: Diagnosis not present

## 2023-04-02 NOTE — Progress Notes (Signed)
  [address], [address] [place], [place] [address] [phone]  Hearing Aid Check     PAYTIN RAMAKRISHNAN comes for a scheduled appointment for a hearing aid check.   Accompanied UR:[id number]    Left  Hearing aid manufacturer Oticon Zircon 1 miniRITE SN: [id number]  Hearing aid style  Receiver in the ear  Hearing aid battery rechargeable  Receiver 1-85  Dome/ custom earpiece   Retention wire yes  Warranty expiration date 04-14-2025  Loss and Damage 04-14-2025  Initial fitting date 04-10-2022  Device was fit at: [facility] clinic    Chief complaint: Patient reports the left aid doe snot work as well. She  was implanted in December 2024 with a right Medel CI at [facility]  Actions taken: Device was cleaned and aid was reprogrammed with the available 85dB receiver using today's hearing test results. Patient said she liked the changes and will call me in 2 weeks to see if the new speaker ( 100 dB receiver) was in stock. .  Services fee: $0 was paid at checkout.  Patient was oriented about needing a stronger receiver and ideally run live speech mapping, but currently I do not have the receiver in stock or the equipment to run REM's.  Recommend: Return for a hearing aid check in 2 -3 weeks- when I have the new speaker in . Return for a hearing evaluation and to see an ENT, if concerns with hearing changes arise.    Tameem Pullara [name], AUD

## 2023-04-03 DIAGNOSIS — H903 Sensorineural hearing loss, bilateral: Secondary | ICD-10-CM | POA: Insufficient documentation

## 2023-04-03 DIAGNOSIS — H7202 Central perforation of tympanic membrane, left ear: Secondary | ICD-10-CM | POA: Insufficient documentation

## 2023-04-03 NOTE — Progress Notes (Signed)
  [address] [place], [place] [address] [phone]  Audiological Evaluation    Name: Stephanie Cordova     DOB:   06-29-1955      MRN:   [id number]                                                                                     Service Date: 04/03/2023     Accompanied by: unaccompanied    Patient comes today after Dr. Suszanne Conners, ENT sent a referral for a hearing evaluation due to concerns with hearing loss.   Symptoms Yes Details  Hearing loss  [x]  Previous audiogram on file was completed at Dr. [facility] clinic.  Tinnitus  []    Ear pain/ infections/pressure  []    Balance problems  []    Noise exposure history  []    Previous ear surgeries  []    Family history of hearing loss  []    Amplification  [x]  Has a left hearing aid and recently ( December 2024) had surgery at [facility] for a right Cochlear Implant ( Medel)  Other  []      Otoscopy: Right ear: Clear external ear canals and notable landmarks visualized on the tympanic membrane. Left ear:  Clear external ear canal and pressure equalization tube was visualized.  Tympanometry: Right ear: Type As- Normal external ear canal volume with normal middle ear pressure and low tympanic membrane compliance Left ear: Could not obtain seal; middle ear status is unable to be determined at this time.    Pure tone Audiometry: Right ear- Severe to profound sensorineural hearing loss from 269-261-7000 Hz ( reported responses were vibrotactile).   Left ear-  Moderately severe to profound sensorineural hearing loss from 125 Hz - 8000 Hz.  Speech Audiometry: Right ear- Speech Reception Threshold (SRT) was obtained at NR @ 110 dBHL (maximum equipment limits) Left ear-Speech Reception Threshold (SRT) was obtained at 75 dBHL.   Word Recognition Score Tested using NU-6 (MLV) Right ear: Could not test due to degree of hearing loss. Left ear: 52% was obtained at a presentation level of 75 dBHL with contralateral masking which is deemed as  poor.    The hearing test results were completed under headphones and results are deemed to be of good reliability for data obtained. Test technique:  conventional    Impression: There was a significant decline in pure-tone thresholds today when compared to the previous audiogram on file.   Recommendations: Follow up with ENT as scheduled for today. Return for a hearing evaluation if concerns with hearing changes arise or per MD recommendation. Recommend a hearing aid check with their audiologist or hearing aid dispenser.   Zamyah Wiesman [name], AUD

## 2023-04-03 NOTE — Progress Notes (Signed)
 Patient ID: Stephanie Cordova, female   DOB: Feb 13, 1955, 68 y.o.   MRN: [id number]  Follow-up: Hearing loss  HPI: The patient is a 68 year old female who returns today for her follow-up evaluation.  She was previously seen for an acute decrease in her left ear hearing. She has a history of asymmetric right ear hearing loss.  Her MRI scan was negative.  She was treated with prednisone Dosepak and dexamethasone infusion without significant improvement in her left ear hearing.  She subsequently underwent cochlear implantation on the right side in December 2024.  The surgery was performed at [facility].  The patient reports significant improvement in her hearing with the use of her right ear cochlear implant and left ear hearing aid.  Currently she denies any otalgia, otorrhea, or vertigo.  Exam: General: Communicates without difficulty, well nourished, no acute distress. Head: Normocephalic, no evidence injury, no tenderness, facial buttresses intact without stepoff. Face/sinus: No tenderness to palpation and percussion. Facial movement is normal and symmetric. Eyes: PERRL, EOMI. No scleral icterus, conjunctivae clear. Neuro: CN II exam reveals vision grossly intact.  No nystagmus at any point of gaze. Ears: Auricles well formed without lesions.  Ear canals are intact without mass or lesion.  No erythema or edema is appreciated.  The left tube is in place and patent.  Nose: External evaluation reveals normal support and skin without lesions.  Dorsum is intact.  Anterior rhinoscopy reveals pink mucosa over anterior aspect of inferior turbinates and intact septum.  No purulence noted. Oral:  Oral cavity and oropharynx are intact, symmetric, without erythema or edema.  Mucosa is moist without lesions. Neck: Full range of motion without pain.  There is no significant lymphadenopathy.  No masses palpable.  Thyroid bed within normal limits to palpation.  Parotid glands and submandibular glands equal bilaterally without  mass.  Trachea is midline. Neuro:  CN 2-12 grossly intact. Gait normal.   Her hearing test shows profound right ear hearing loss and left ear severe sensorineural hearing loss.  Assessment: 1.  Right ear profound sensorineural hearing loss and left ear severe sensorineural hearing loss. 2.  Her left ear ventilating tube is in place and patent. 3.  The patient has a right ear cochlear implant and left ear hearing aid.  Plan: 1.  The physical exam findings and the hearing test results are reviewed with the patient. 2.  The patient should continue the use of her hearing aid and cochlear implant. 3.  Dry ear precaution on the left side. 4.  The patient will return for reevaluation in 1 year.

## 2023-04-05 ENCOUNTER — Encounter: Payer: Self-pay | Admitting: Audiology

## 2023-04-24 ENCOUNTER — Ambulatory Visit (INDEPENDENT_AMBULATORY_CARE_PROVIDER_SITE_OTHER): Payer: Self-pay | Admitting: Audiology

## 2023-04-24 DIAGNOSIS — H903 Sensorineural hearing loss, bilateral: Secondary | ICD-10-CM

## 2023-04-24 NOTE — Progress Notes (Unsigned)
  [address] [place], [place] [address] [phone]  Hearing Aid Check     JAID QUIRION comes for a scheduled appointment for a hearing aid check.   Accompanied BJ:[id number]     Left  Hearing aid manufacturer Oticon Zircon 1 miniRITE SN:  [id number]  Hearing aid style  Receiver in the ear  Hearing aid battery rechargeable  Receiver 1-85  Dome/ custom earpiece    Retention wire yes  Warranty expiration date 04-14-2025  Loss and Damage 04-14-2025  Initial fitting date 04-10-2022  Device was fit at: [facility]    Of note:  Patient implanted in December 2024 with a right Medel CI at [facility].    Chief complaint: Patient come to have the receiver in her left aid changed from 1-85 to 1-100. However, the 1-100L receiver was too short for her. Hearing aid was programmed to patient's comfort level using the 100 receiver strength.   Patient will continue to wear the 85 receiver and I will switch it when the order placed for 2-100 and 3-100 L is received.     Services fee: $0 was paid at checkout.  Recommend: Return for a hearing aid check when called to have the receiver changed. Return for a hearing evaluation and to see an ENT, if concerns with hearing changes arise.    Lujain Kraszewski [name], AUD

## 2023-05-01 ENCOUNTER — Ambulatory Visit (INDEPENDENT_AMBULATORY_CARE_PROVIDER_SITE_OTHER): Payer: Self-pay | Admitting: Audiology

## 2023-05-01 DIAGNOSIS — H903 Sensorineural hearing loss, bilateral: Secondary | ICD-10-CM

## 2023-05-02 NOTE — Progress Notes (Signed)
  [address] [place], [place] [address] [phone]  Hearing Aid Check     Stephanie Cordova comes for a scheduled appointment for a hearing aid check.  Accompanied MV:[id number]      Left  Hearing aid manufacturer Oticon Zircon 1 miniRITE SN:  [id number]  Hearing aid style  Receiver in the ear  Hearing aid battery rechargeable  Receiver 3-100 L (used to use 1-85)  Dome/ custom earpiece  8 double dome  Retention wire yes  Warranty expiration date 04-14-2025  Loss and Damage 04-14-2025  Initial fitting date 04-10-2022  Device was fit at: [facility]     Patient comes to be fit with a 100 L receiver. Last time she was here the size 1 that was ordered for her was too short.   Patient will now wear a 3-100L receiver. Applicable gain changes were made last time she was here (using the 1L 1000 dB receiver). Patient reports the hearing aid sounds much better with the double dome and 3L-1000 receiver.  Services fee: $0 was paid at checkout.  Patient was oriented about the importance of making sure the hearing aids are programmed correctly for her by using live speech mapping. I do not have the equipment available. I gave the patient a copy of the  audiogram and with her authorization spoke with Dr. Jannifer Hick and GENT in the patient's presence to ask if they would help running that test. The patient said would call to make the appointment and was made aware that they would have to charge their service fees.   Recommend: Return for a hearing aid check , as needed. Return for a hearing evaluation and to see an ENT, if concerns with hearing changes arise.    [name] [name], AUD

## 2023-08-01 ENCOUNTER — Other Ambulatory Visit: Payer: Self-pay | Admitting: Internal Medicine

## 2023-08-01 DIAGNOSIS — D539 Nutritional anemia, unspecified: Secondary | ICD-10-CM

## 2023-08-05 ENCOUNTER — Inpatient Hospital Stay

## 2023-08-05 ENCOUNTER — Inpatient Hospital Stay: Admitting: Internal Medicine

## 2023-08-08 NOTE — Progress Notes (Signed)
 CT Head - no acute intracranial abnormality.  No acute hemorrhage   In the future, if she falls and hits her head, she needs to seek immediate care

## 2023-08-13 ENCOUNTER — Ambulatory Visit: Payer: Self-pay | Admitting: Hematology & Oncology

## 2023-08-13 ENCOUNTER — Encounter: Payer: Self-pay | Admitting: Hematology & Oncology

## 2023-08-13 ENCOUNTER — Inpatient Hospital Stay: Attending: Hematology & Oncology | Admitting: Hematology & Oncology

## 2023-08-13 ENCOUNTER — Inpatient Hospital Stay

## 2023-08-13 VITALS — BP 151/76 | HR 73 | Temp 98.7°F | Resp 20 | Ht 61.0 in | Wt 193.0 lb

## 2023-08-13 DIAGNOSIS — Z9884 Bariatric surgery status: Secondary | ICD-10-CM | POA: Insufficient documentation

## 2023-08-13 DIAGNOSIS — D696 Thrombocytopenia, unspecified: Secondary | ICD-10-CM | POA: Insufficient documentation

## 2023-08-13 DIAGNOSIS — Z7985 Long-term (current) use of injectable non-insulin antidiabetic drugs: Secondary | ICD-10-CM | POA: Diagnosis not present

## 2023-08-13 DIAGNOSIS — Z809 Family history of malignant neoplasm, unspecified: Secondary | ICD-10-CM | POA: Insufficient documentation

## 2023-08-13 DIAGNOSIS — Z8616 Personal history of COVID-19: Secondary | ICD-10-CM | POA: Diagnosis not present

## 2023-08-13 DIAGNOSIS — K7581 Nonalcoholic steatohepatitis (NASH): Secondary | ICD-10-CM | POA: Insufficient documentation

## 2023-08-13 DIAGNOSIS — Z806 Family history of leukemia: Secondary | ICD-10-CM | POA: Diagnosis not present

## 2023-08-13 DIAGNOSIS — Z79899 Other long term (current) drug therapy: Secondary | ICD-10-CM | POA: Diagnosis not present

## 2023-08-13 DIAGNOSIS — E119 Type 2 diabetes mellitus without complications: Secondary | ICD-10-CM | POA: Insufficient documentation

## 2023-08-13 DIAGNOSIS — R161 Splenomegaly, not elsewhere classified: Secondary | ICD-10-CM | POA: Insufficient documentation

## 2023-08-13 LAB — CMP (CANCER CENTER ONLY)
ALT: 22 U/L (ref 0–44)
AST: 29 U/L (ref 15–41)
Albumin: 3.9 g/dL (ref 3.5–5.0)
Alkaline Phosphatase: 80 U/L (ref 38–126)
Anion gap: 9 (ref 5–15)
BUN: 13 mg/dL (ref 8–23)
CO2: 28 mmol/L (ref 22–32)
Calcium: 9.2 mg/dL (ref 8.9–10.3)
Chloride: 103 mmol/L (ref 98–111)
Creatinine: 0.93 mg/dL (ref 0.44–1.00)
GFR, Estimated: >60 mL/min (ref >60)
Glucose, Bld: 271 mg/dL — ABNORMAL HIGH (ref 70–99)
Potassium: 4.3 mmol/L (ref 3.5–5.1)
Sodium: 140 mmol/L (ref 135–145)
Total Bilirubin: 1.4 mg/dL — ABNORMAL HIGH (ref 0.0–1.2)
Total Protein: 6.1 g/dL — ABNORMAL LOW (ref 6.5–8.1)

## 2023-08-13 LAB — CBC WITH DIFFERENTIAL (CANCER CENTER ONLY)
Abs Immature Granulocytes: 0.03 K/uL (ref 0.00–0.07)
Basophils Absolute: 0 K/uL (ref 0.0–0.1)
Basophils Relative: 1 %
Eosinophils Absolute: 0.1 K/uL (ref 0.0–0.5)
Eosinophils Relative: 2 %
HCT: 36.8 % (ref 36.0–46.0)
Hemoglobin: 12.4 g/dL (ref 12.0–15.0)
Immature Granulocytes: 1 %
Lymphocytes Relative: 14 %
Lymphs Abs: 0.8 K/uL (ref 0.7–4.0)
MCH: 30.5 pg (ref 26.0–34.0)
MCHC: 33.7 g/dL (ref 30.0–36.0)
MCV: 90.4 fL (ref 80.0–100.0)
Monocytes Absolute: 0.4 K/uL (ref 0.1–1.0)
Monocytes Relative: 7 %
Neutro Abs: 4.1 K/uL (ref 1.7–7.7)
Neutrophils Relative %: 75 %
Platelet Count: 83 K/uL — ABNORMAL LOW (ref 150–400)
RBC: 4.07 MIL/uL (ref 3.87–5.11)
RDW: 14.2 % (ref 11.5–15.5)
WBC Count: 5.5 K/uL (ref 4.0–10.5)
nRBC: 0 % (ref 0.0–0.2)

## 2023-08-13 LAB — LACTATE DEHYDROGENASE: LDH: 187 U/L (ref 98–192)

## 2023-08-13 LAB — SAVE SMEAR(SSMR), FOR PROVIDER SLIDE REVIEW

## 2023-08-13 LAB — VITAMIN B12: Vitamin B-12: 715 pg/mL (ref 180–914)

## 2023-08-13 NOTE — Progress Notes (Signed)
 Referral MD  Reason for Referral: Chronic thrombocytopenia-likely splenomegaly secondary to NASH  Chief Complaint  Patient presents with   New Patient (Initial Visit)    I have low platelets  : My platelets have been low.  HPI: Stephanie Cordova is a very charming 68 year old white female.  I actually have known the family for quite a long time.  She had gastric bypass a few years ago.  She has lost 90 pounds.  She has had longstanding NASH.  The last CT scan that I saw on her was back in 2023.  She has cirrhosis with splenomegaly.  She says that she gets a CT scan every 6 months as she has 3 liver nodules.  She has had cataract surgery.  Apparently, the ophthalmologist is worried about her platelet count.  She has had the right eye taken care of.  She is not sure when the left eye will be done, again because of the thrombocytopenia.  She has had some bruising.  There is been no bleeding.  She had COVID several years ago.  I think she was hospitalized for this.  She was not sure how she was treated.  She does not smoke.  She does not drink..  She is on Mounjaro for her diabetes.  She has had no change in bowel or bladder habits.  I think she had a colonoscopy 5 years ago.  She has had no rashes.  There is been no petechia.  She has had no headache.  She has had some dizziness.  I am not sure exactly where the dizziness could be from.  She also has felt tired.  Overall, I would have said that her performance status is probably ECOG 1.    Past Medical History:  Diagnosis Date   Abnormal liver function tests 08/07/2016   Arthritis    b/l knees, bone on bone   Cirrhosis of liver (HCC) 12/21/2012   Confirmed via ultrasound per patient, she reports neg acute hepatitis panel    Cold sore 11/04/2016   Diabetes mellitus type 2 in obese 12/21/2012   takes Amaryl  daily   History of bronchitis 03/2015   History of colon polyps    benign   Hypertension    takes Metoprolol  and Lisinopril   daily   Insomnia    Pneumonia    hx of > 5 yrs ago   Pulmonary nodule 08/07/2016   Sinusitis, acute 01/26/2016   Vaginitis 10/30/2016   Vitamin D  deficiency 04/24/2016  :   Past Surgical History:  Procedure Laterality Date   ABDOMINAL HYSTERECTOMY  1996   BREAST BIOPSY     CHOLECYSTECTOMY N/A 09/06/2015   Procedure: LAPAROSCOPIC CHOLECYSTECTOMY;  Surgeon: Lynda Leos, MD;  Location: [facility] OR;  Service: General;  Laterality: N/A;   COLONOSCOPY WITH ESOPHAGOGASTRODUODENOSCOPY (EGD)     KNEE ARTHROSCOPY Bilateral    LEFT HEART CATH AND CORONARY ANGIOGRAPHY N/A 12/27/2016   Procedure: LEFT HEART CATH AND CORONARY ANGIOGRAPHY;  Surgeon: Wonda Sharper, MD;  Location: [facility]IVE [facility];  Service: Cardiovascular;  Laterality: N/A;   TONSILLECTOMY     TUBAL LIGATION    :   Current Outpatient Medications:    Blood Glucose Monitoring Suppl (ONE TOUCH ULTRA SYSTEM KIT) w/Device KIT, Use as directed twice daily to check blood sugar.  DX E11.9, Disp: 1 each, Rfl: 0   carvedilol (COREG) 12.5 MG tablet, Take 12.5 mg by mouth daily., Disp: , Rfl:    fluticasone  (FLONASE ) 50 MCG/ACT nasal spray, Place 2  sprays into both nostrils daily., Disp: , Rfl:    furosemide  (LASIX ) 20 MG tablet, Take 20 mg by mouth daily. (Patient taking differently: Take 20 mg by mouth once a week.), Disp: , Rfl:    LORazepam (ATIVAN) 0.5 MG tablet, TAKE 1 TABLET AT NIGHTTIME AS NEEDED FOR SLEEP, Disp: , Rfl:    meclizine (ANTIVERT) 12.5 MG tablet, Take 12.5 mg by mouth 3 (three) times daily as needed for dizziness., Disp: , Rfl:    MOUNJARO 5 MG/0.5ML Pen, Inject 5 mg into the skin once a week., Disp: , Rfl:    nitroGLYCERIN  (NITROSTAT ) 0.4 MG SL tablet, Place 1 tablet (0.4 mg total) under the tongue every 5 (five) minutes as needed for chest pain., Disp: 25 tablet, Rfl: 3   ONE TOUCH ULTRA TEST test strip, USE AS DIRECTED TWICE A DAY, Disp: 100 each, Rfl: 2   Vitamin D , Ergocalciferol , (DRISDOL ) 1.25 MG (50000 UT) CAPS  capsule, TAKE 1 CAPSULE BY MOUTH ONCE A WEEK, Disp: 12 capsule, Rfl: 0   insulin  lispro (HUMALOG  KWIKPEN) 100 UNIT/ML KwikPen, INJECT 10 UNITS INTO THE SKIN 3 TIMES DAILY, Disp: 1 pen, Rfl: 0   lisinopril  (PRINIVIL ,ZESTRIL ) 20 MG tablet, Take 1 tablet (20 mg total) by mouth 2 (two) times daily., Disp: 60 tablet, Rfl: 3   rosuvastatin  (CRESTOR ) 10 MG tablet, Take 1 tablet (10 mg total) daily by mouth. (Patient not taking: Reported on 12/12/2017), Disp: 30 tablet, Rfl: 6:  :   Allergies  Allergen Reactions   Losartan  Palpitations   Lexapro  [Escitalopram ] Other (See Comments)    swelling   Sulfa Antibiotics Hives and Rash    Burning rash  :   Family History  Problem Relation Age of Onset   Hypertension Mother    Hyperlipidemia Mother    Leukemia Mother    Cancer Mother    Hypertension Father    Hyperlipidemia Father    Diabetes Father    Heart disease Father    Alzheimer's disease Father    Multiple sclerosis Son    Heart disease Maternal Grandfather   :   Social History   Socioeconomic History   Marital status: Widowed    Spouse name: Not on file   Number of children: Not on file   Years of education: Not on file   Highest education level: Not on file  Occupational History   Occupation: Customer service  Tobacco Use   Smoking status: Never   Smokeless tobacco: Never  Vaping Use   Vaping status: Never Used  Substance and Sexual Activity   Alcohol use: No    Alcohol/week: 0.0 standard drinks of alcohol   Drug use: No   Sexual activity: Not Currently    Comment: lives alone, widowed in 2006  Other Topics Concern   Not on file  Social History Narrative   Not on file   Social Drivers of Health   Financial Resource Strain: Low Risk  (08/13/2023)   Overall Financial Resource Strain (CARDIA)    Difficulty of Paying Living Expenses: Not hard at all  Food Insecurity: Low Risk  (07/29/2023)   Received from [facility]   Hunger Vital Sign    Within the past 12  months, you worried that your food would run out before you got money to buy more: Never true    Within the past 12 months, the food you bought just didn't last and you didn't have money to get more. : Never true  Transportation Needs: No Transportation  Needs (07/29/2023)   Received from [facility]    In the past 12 months, has lack of reliable transportation kept you from medical appointments, meetings, work or from getting things needed for daily living? : No  Physical Activity: Sufficiently Active (08/13/2023)   Exercise Vital Sign    Days of Exercise per Week: 7 days    Minutes of Exercise per Session: 30 min  Stress: No Stress Concern Present (08/13/2023)   [employer] of Occupational Health - Occupational Stress Questionnaire    Feeling of Stress: Not at all  Social Connections: Moderately Isolated (08/13/2023)   Social Connection and Isolation Panel    Frequency of Communication with Friends and Family: More than three times a week    Frequency of Social Gatherings with Friends and Family: More than three times a week    Attends Religious Services: More than 4 times per year    Active Member of [employer] or Organizations: No    Attends Banker Meetings: Never    Marital Status: Widowed  Intimate Partner Violence: Not At Risk (07/18/2021)   Received from [facility] visits prior to 03/31/2022.   Humiliation, Afraid, Rape, and Kick questionnaire    Fear of Current or Ex-Partner: No    Emotionally Abused: No    Physically Abused: No    Sexually Abused: No  : Review of Systems  Constitutional:  Positive for malaise/fatigue.  HENT: Negative.    Eyes: Negative.   Respiratory: Negative.    Cardiovascular: Negative.   Gastrointestinal: Negative.   Genitourinary: Negative.   Musculoskeletal: Negative.   Skin: Negative.   Neurological:  Positive for dizziness.  Endo/Heme/Allergies: Negative.   Psychiatric/Behavioral: Negative.       Exam:  Vital signs show temperature of 98.7.  Pulse 73.  Blood pressure 151/76.  Weight is 193 pounds.  @IPVITALS @ Physical Exam Vitals reviewed.  HENT:     Head: Normocephalic and atraumatic.  Eyes:     Pupils: Pupils are equal, round, and reactive to light.  Cardiovascular:     Rate and Rhythm: Normal rate and regular rhythm.     Heart sounds: Normal heart sounds.  Pulmonary:     Effort: Pulmonary effort is normal.     Breath sounds: Normal breath sounds.  Abdominal:     General: Bowel sounds are normal.     Palpations: Abdomen is soft.     Comments: Abdominal exam is soft.  There is no fluid wave.  She has no guarding or rebound tenderness.  There is no palpable adenopathy.  Her spleen tip is palpable with inspiration under the left costal margin.  Her liver is nonpalpable.  Musculoskeletal:        General: No tenderness or deformity. Normal range of motion.     Cervical back: Normal range of motion.  Lymphadenopathy:     Cervical: No cervical adenopathy.  Skin:    General: Skin is warm and dry.     Findings: No erythema or rash.     Comments: There is ecchymoses on the right knee.  Neurological:     Mental Status: She is alert and oriented to person, place, and time.  Psychiatric:        Behavior: Behavior normal.        Thought Content: Thought content normal.        Judgment: Judgment normal.     Recent Labs    08/13/23 1059  WBC 5.5  HGB  12.4  HCT 36.8  PLT 83*    Recent Labs    08/13/23 1059  NA 140  K 4.3  CL 103  CO2 28  GLUCOSE 271*  BUN 13  CREATININE 0.93  CALCIUM  9.2    Blood smear review: Normochromic and normocytic population of red blood cells.  There are no nucleated red blood cells.  I see no teardrop cells.  There is no rouleaux formation.  There is no schistocytes or spherocytes.  White blood cells appear normal in morphology and maturation.  I do not see any immature myeloid or lymphoid cells.  There are no hypersegmented polys.   I see no blasts.  Platelets are decreased in number.  She has mostly smaller platelets.  There are a few large platelets that are well granulated.  Pathology: None    Assessment and Plan: Ms. Devaul is a very nice 68 year old white female.  She has mild thrombocytopenia.  Again, I have to believe this is all secondary to her NASH and splenomegaly.  The 1 possible possibility might be the fact that she did have gastric bypass a few years ago.  We will have to see what her vitamin B12 level is.  I would think that 83,000, her platelet count should be fine for cataract surgery.  She obviously had her first cataract surgery and her platelets were no better.  If she does have her platelets up before her cataract surgery, we can certainly try her on Nplate.  Another possibility might be Doptelet.  I do not see a need for a bone marrow biopsy.  Again, I really think that the issue is going to be the splenomegaly.  I suspect this probably is sequestering her platelets.  Again, I just do not think that her platelet count is all that bad for her to have cataract surgery.  We will have to see what her B12 level is.  Her blood sugar was quite high today.  She is on nothing for her diabetes outside of Mounjaro.  I suspect we probably will get her back to see us  in another 6 to 8 weeks.  I think this would be reasonable for follow-up.  He was delightful to see Ms. Wonda.  She has a cochlear implant over on the right side.  This allows her to hear a lot better.

## 2023-08-13 NOTE — Progress Notes (Signed)
 BP remains elevated, 151/76, instructed to monitor at home and if it remains over 140/90, notify PCP. Verbalized understanding.

## 2023-09-23 ENCOUNTER — Ambulatory Visit: Attending: Nurse Practitioner | Admitting: Physical Therapy

## 2023-09-23 ENCOUNTER — Other Ambulatory Visit: Payer: Self-pay

## 2023-09-23 ENCOUNTER — Encounter: Payer: Self-pay | Admitting: Physical Therapy

## 2023-09-23 DIAGNOSIS — R42 Dizziness and giddiness: Secondary | ICD-10-CM | POA: Insufficient documentation

## 2023-09-23 DIAGNOSIS — R2681 Unsteadiness on feet: Secondary | ICD-10-CM | POA: Diagnosis present

## 2023-09-23 NOTE — Therapy (Signed)
 OUTPATIENT PHYSICAL THERAPY VESTIBULAR EVALUATION     Patient Name: Stephanie Cordova MRN: [id number] DOB:[birth year], 68 y.o., female Today's Date: 09/23/2023  END OF SESSION:  PT End of Session - 09/23/23 1151     Visit Number 1    Number of Visits 10    Date for PT Re-Evaluation 10/25/23    Authorization Type [employer] Medicare    Progress Note Due on Visit 10    PT Start Time 1104    PT Stop Time 1145    PT Time Calculation (min) 41 min    Activity Tolerance Patient tolerated treatment well    Behavior During Therapy WFL for tasks assessed/performed          Past Medical History:  Diagnosis Date   Abnormal liver function tests 08/07/2016   Arthritis    b/l knees, bone on bone   Cirrhosis of liver (HCC) 12/21/2012   Confirmed via ultrasound per patient, she reports neg acute hepatitis panel    Cold sore 11/04/2016   Diabetes mellitus type 2 in obese 12/21/2012   takes Amaryl  daily   History of bronchitis 03/2015   History of colon polyps    benign   Hypertension    takes Metoprolol  and Lisinopril  daily   Insomnia    Pneumonia    hx of > 5 yrs ago   Pulmonary nodule 08/07/2016   Sinusitis, acute 01/26/2016   Vaginitis 10/30/2016   Vitamin D  deficiency 04/24/2016   Past Surgical History:  Procedure Laterality Date   ABDOMINAL HYSTERECTOMY  1996   BREAST BIOPSY     CHOLECYSTECTOMY N/A 09/06/2015   Procedure: LAPAROSCOPIC CHOLECYSTECTOMY;  Surgeon: Lynda Leos, MD;  Location: [facility] OR;  Service: General;  Laterality: N/A;   COLONOSCOPY WITH ESOPHAGOGASTRODUODENOSCOPY (EGD)     KNEE ARTHROSCOPY Bilateral    LEFT HEART CATH AND CORONARY ANGIOGRAPHY N/A 12/27/2016   Procedure: LEFT HEART CATH AND CORONARY ANGIOGRAPHY;  Surgeon: Wonda Sharper, MD;  Location: [facility] INVASIVE CV LAB;  Service: Cardiovascular;  Laterality: N/A;   TONSILLECTOMY     TUBAL LIGATION     Patient Active Problem List   Diagnosis Date Noted   Sensory hearing loss, bilateral 04/03/2023   Central  perforation of tympanic membrane of left ear 04/03/2023   Hordeolum externum of right lower eyelid 08/04/2017   Shortness of breath 12/27/2016   Exertional chest pain    Angina pectoris (HCC) 12/04/2016   Obstructive hypertrophic cardiomyopathy (HCC) 12/04/2016   Atypical chest pain 11/25/2016   Cold sore 11/04/2016   Vaginitis 10/30/2016   Abnormal liver function tests 08/07/2016   Pulmonary nodule 08/07/2016   Vitamin D  deficiency 04/24/2016   Hyperlipidemia 04/24/2016   Trigger finger, acquired 11/16/2015   Left knee pain 11/16/2015   RUQ pain 08/09/2015   Skin lesion of right lower extremity 08/09/2015   Snoring 06/29/2014   Cephalalgia 06/19/2014   Restless sleeper 06/19/2014   Type 2 diabetes mellitus with obesity (HCC) 12/21/2012   Cirrhosis of liver (HCC) 12/21/2012   Preventative health care 11/30/2012   History of colonic polyps 11/30/2012   HTN (hypertension) 11/30/2012   Insomnia    Obesity    Hot flash, menopausal    Arthritis    Allergy     PCP: Leila Lucie LABOR, MD REFERRING PROVIDER: Anita Bernardino BROCKS, FNP   REFERRING DIAG: R42 (ICD-10-CM) - Dizziness and giddiness   THERAPY DIAG:  Dizziness and giddiness  Unsteadiness on feet  ONSET DATE: 2 months ago  Rationale for  Evaluation and Treatment: Rehabilitation  SUBJECTIVE:   SUBJECTIVE STATEMENT: About 2 months ago, I woke up one morning and I was dizzy getting out of bed.  Looking left and right and looking down tend to trigger it.  I did go to the ear doctor and did have fluid in the inner ear; took antibiotics to clear that.  The dizziness is limiting me from walking the dog daily.  Trying to do yardwork and cleaning is hard. Pt accompanied by: self  PERTINENT HISTORY: arthritis bilat knees, HTN, DM, thrombocytopenia, cochlear implant 9 months ago  PAIN:  Are you having pain? No  PRECAUTIONS: Fall and Other: R cochlear implant  RED FLAGS: None   WEIGHT BEARING RESTRICTIONS: No  FALLS: Has  patient fallen in last 6 months? Yes. Number of falls 1  LIVING ENVIRONMENT: Lives with: lives alone Lives in: House/apartment Stairs: 3 steps into home, single level home Has following equipment at home: None  PLOF: Independent  PATIENT GOALS: To not be dizzy when I bend over.  OBJECTIVE:  Note: Objective measures were completed at Evaluation unless otherwise noted.  DIAGNOSTIC FINDINGS: CT of head is normal  COGNITION: Overall cognitive status: Within functional limits for tasks assessed   POSTURE:  rounded shoulders and forward head  Cervical ROM:    Active A/PROM (deg) eval  Flexion 40  Extension 30  Right lateral flexion   Left lateral flexion   Right rotation 45  Left rotation 45  (Blank rows = not tested)   BED MOBILITY:  Independent, slow and guarded  TRANSFERS: Assistive device utilized: None  Sit to stand: Modified independence Stand to sit: Modified independence  GAIT: Gait pattern: step through pattern and trendelenburg Distance walked: 50 ft Assistive device utilized: None Level of assistance: Modified independence   PATIENT SURVEYS:  DHI: THE DIZZINESS HANDICAP INVENTORY (DHI)  P1. Does looking up increase your problem? 0 = No  E2. Because of your problem, do you feel frustrated? 4 = Yes  F3. Because of your problem, do you restrict your travel for business or recreation?  4 = Yes  P4. Does walking down the aisle of a supermarket increase your problems?  4 = Yes  F5. Because of your problem, do you have difficulty getting into or out of bed?  0 = No  F6. Does your problem significantly restrict your participation in social activities, such as going out to dinner, going to the movies, dancing, or going to parties? 2 = Sometimes  F7. Because of your problem, do you have difficulty reading?  0 = No  P8. Does performing more ambitious activities such as sports, dancing, household chores (sweeping or putting dishes away) increase your problems?  2  = Sometimes  E9. Because of your problem, are you afraid to leave your home without having without having someone accompany you?  0 = No  E10. Because of your problem have you been embarrassed in front of others?  2 = Sometimes  P11. Do quick movements of your head increase your problem?  4 = Yes  F12. Because of your problem, do you avoid heights?  0 = No  P13. Does turning over in bed increase your problem?  0 = No  F14. Because of your problem, is it difficult for you to do strenuous homework or yard work? 2 = Sometimes  E15. Because of your problem, are you afraid people may think you are intoxicated? 4 = Yes  F16. Because of your problem, is it  difficult for you to go for a walk by yourself?  2 = Sometimes  P17. Does walking down a sidewalk increase your problem?  2 = Sometimes  E18.Because of your problem, is it difficult for you to concentrate 0 = No  F19. Because of your problem, is it difficult for you to walk around your house in the dark? 4 = Yes  E20. Because of your problem, are you afraid to stay home alone?  0 = No  E21. Because of your problem, do you feel handicapped? 2 = Sometimes  E22. Has the problem placed stress on your relationships with members of your family or friends? 0 = No  E23. Because of your problem, are you depressed?  2 = Sometimes  F24. Does your problem interfere with your job or household responsibilities?  2 = Sometimes  P25. Does bending over increase your problem?  4 = Yes  TOTAL 46    DHI Scoring Instructions  The patient is asked to answer each question as it pertains to dizziness or unsteadiness problems, specifically  considering their condition during the last month. Questions are designed to incorporate functional (F), physical  (P), and emotional (E) impacts on disability.   Scores greater than 10 points should be referred to balance specialists for further evaluation.   16-34 Points (mild handicap)  36-52 Points (moderate handicap)  54+  Points (severe handicap)  Minimally Detectable Change: 17 points ([address] [place], 1990)  [place], G. SHAUNNA. and [place], C. W. (1990). The development of the Dizziness Handicap Inventory. Archives of Otolaryngology - Head and Neck Surgery 116(4): [id number].   VESTIBULAR ASSESSMENT:  GENERAL OBSERVATION: Pt moves slowly, guarded, especially with bed mobility   SYMPTOM BEHAVIOR:  Subjective history: 2 months ago, pt reports she woke up to dizziness.  She has only experienced this previously just after her cochlear implant was placed (about 9 months ago).  She reports sensation as spinning, lightheaded, imbalanced; her symptoms are mostly lightheadedness and imbalance now, that comes on with head motions L and R and bending over/looking up.  She does have hx of cochlear implant on R (approx 9 months ago), fall and hitting her head (CT negative), antibiotics given approx 2-2.5 months ago due to fluid in her inner ear (pt states dizziness preceded this).   Non-Vestibular symptoms: NA  Type of dizziness: Spinning/Vertigo, Unsteady with head/body turns, Lightheadedness/Faint, and quick movements lag  Frequency: daily  Duration: lasts until I sit back down  Aggravating factors: Induced by position change: supine to sit, Induced by motion: looking up at the ceiling, bending down to the ground, turning body quickly, and turning head quickly, and worse in the afternoon  Relieving factors: rest and sitting down  Progression of symptoms: unchanged  OCULOMOTOR EXAM:  Ocular Alignment: normal  Ocular ROM: No Limitations  Spontaneous Nystagmus: absent  Gaze-Induced Nystagmus: absent  Smooth Pursuits: intact  Saccades: intact and except some quick, overshoot movements R eye  Convergence/Divergence: 2-3 cm    VESTIBULAR - OCULAR REFLEX:   Slow VOR: Comment: slowed horizontal, rates as 8/10 dizziness; no symptoms vertical  VOR Cancellation: Corrective Saccades to R side, 5/10 dizziness  Head-Impulse  Test: HIT Right: positive HIT Left: positive  Dynamic Visual Acuity: NT   POSITIONAL TESTING: Right Dix-Hallpike: no nystagmus and reports dizziness getting into position Left Dix-Hallpike: no nystagmus and reports dizziness coming into position (pt comes into position very slowly, did not see nystagmus) Right Roll Test: no nystagmus and mild dizziness getting into  position Left Roll Test: no nystagmus and mild dizziness getting into position    M-CTSIB  Condition 1: Firm Surface, EO 30 Sec, Normal Sway  Condition 2: Firm Surface, EC 30 Sec, Mild Sway  Condition 3: Foam Surface, EO 30 Sec, Mild Sway  Condition 4: Foam Surface, EC 8.29 Sec, Severe Sway    MOTION SENSITIVITY:  Motion Sensitivity Quotient Intensity: 0 = none, 1 = Lightheaded, 2 = Mild, 3 = Moderate, 4 = Severe, 5 = Vomiting  Intensity  1. Sitting to supine   2. Supine to L side   3. Supine to R side   4. Supine to sitting   5. L Hallpike-Dix   6. Up from L    7. R Hallpike-Dix   8. Up from R    9. Sitting, head tipped to L knee   10. Head up from L knee   11. Sitting, head tipped to R knee   12. Head up from R knee   13. Sitting head turns x5   14.Sitting head nods x5   15. In stance, 180 turn to L    16. In stance, 180 turn to R                                                                                                                                 TREATMENT DATE: 09/23/2023    PATIENT EDUCATION: Education details: Eval results, POC initiated HEP and explained rationale/symptom progression for habituation Person educated: Patient Education method: Explanation, Demonstration, and Handouts Education comprehension: verbalized understanding and returned demonstration  HOME EXERCISE PROGRAM: Access Code: [email or web address] URL: [email or web address] Date: 09/26/2023 Prepared by: [facility] - Outpatient  Rehab - [facility]  Exercises - Seated Gaze Stabilization with Head Rotation  -  2-3 x daily - 7 x weekly - 3 sets - 30 sec hold - Seated Gaze Stabilization with Head Nod  - 2-3 x daily - 7 x weekly - 1 sets - 30 sec hold GOALS: Goals reviewed with patient? Yes  SHORT TERM GOALS: Target date: 10/04/2023  Pt will be independent with HEP for improved dizziness, balance. Baseline: Goal status: INITIAL  LONG TERM GOALS: Target date: 10/25/2023  Pt will be independent with HEP for improved dizziness, balance. Baseline:  Goal status: INITIAL  2.  DHI score to improve to less than or equal to 28, to demo decreased dizziness impacting daily activities. Baseline: 46 Goal status: INITIAL  3.  Pt will improve Condition 4 on MCTSIB to 30 seconds mod sway or better, to demo improved balance. Baseline: 8 sec Goal status: INITIAL  4.  Pt will improve FGA score to at least 23/30 to decrease fall risk. Baseline: TBD Goal status: INITIAL  5.  Pt will verbalize understanding of fall prevention in home environment.  Baseline:  Goal status: INITIAL    ASSESSMENT:  CLINICAL IMPRESSION: Patient is a 68 y.o. female who was seen today for physical  therapy evaluation and treatment for dizziness and giddiness.  2 months ago, pt reports she woke up to dizziness.  She has only experienced this previously just after her cochlear implant was placed (about 9 months ago).  She reports sensation as spinning, lightheaded, imbalanced; her symptoms are mostly lightheadedness and imbalance now, that comes on with head motions L and R and bending over/looking up.  She does have hx of cochlear implant on R (approx 9 months ago), fall and hitting her head (CT negative), antibiotics given approx 2-2.5 months ago due to fluid in her inner ear (pt states dizziness preceded this).  Today, she presents with oculomotor testing WFL; pt's dizziness increases with VOR, VOR cancellation and she has positive HIT R and L.  With positional testing, she does not have any nystagmus in any position, but she reports  dizziness getting into all positions.  With MCTSIB, she has increased sway on foam and is only able to hold position 4 x 8 seconds.  She demo decreased vestibular system use for balance, vestibular hypofunction, likely motion sensitivity, to be addressed by skilled PT to help pt improve functional mobility and decrease her fall risk.  OBJECTIVE IMPAIRMENTS: Abnormal gait, decreased balance, difficulty walking, and dizziness.   ACTIVITY LIMITATIONS: bending, squatting, bed mobility, reach over head, and locomotion level  PARTICIPATION LIMITATIONS: meal prep, cleaning, laundry, community activity, school, and walking the dog  PERSONAL FACTORS: 3+ comorbidities: see above are also affecting patient's functional outcome.   REHAB POTENTIAL: Good  CLINICAL DECISION MAKING: Stable/uncomplicated  EVALUATION COMPLEXITY: Low   PLAN:  PT FREQUENCY: 2x/week  PT DURATION: other: 5 weeks, including eval week  PLANNED INTERVENTIONS: 97750- Physical Performance Testing, 97110-Therapeutic exercises, 97530- Therapeutic activity, [id number]- Neuromuscular re-education, 97535- Self Care, [id number]- Manual therapy, [phone]- Gait training, Patient/Family education, Balance training, and Vestibular training  PLAN FOR NEXT SESSION: Review and progress HEP-add gaze stabilization, VOR, corner balance for multi-sensory balance; assess FGA.  May need to retest positional vertigo as symptoms warrant   Allana Shrestha W., PT 09/23/2023, 11:52 AM   [facility] [address] [place], [address] [place], [place] [address] Phone # [phone] Fax # [phone]

## 2023-09-26 ENCOUNTER — Ambulatory Visit

## 2023-09-26 DIAGNOSIS — R42 Dizziness and giddiness: Secondary | ICD-10-CM | POA: Diagnosis not present

## 2023-09-26 DIAGNOSIS — R2681 Unsteadiness on feet: Secondary | ICD-10-CM

## 2023-09-26 NOTE — Therapy (Signed)
 OUTPATIENT PHYSICAL THERAPY VESTIBULAR TREATMENT     Patient Name: Stephanie Cordova MRN: [id number] DOB:1955-03-15, 68 y.o., female Today's Date: 09/26/2023  END OF SESSION:  PT End of Session - 09/26/23 0847     Visit Number 2    Number of Visits 10    Date for PT Re-Evaluation 10/25/23    Authorization Type BCBS Medicare    Progress Note Due on Visit 10    PT Start Time [phone]    PT Stop Time 0930    PT Time Calculation (min) 43 min    Activity Tolerance Patient tolerated treatment well    Behavior During Therapy [facility] for tasks assessed/performed          Past Medical History:  Diagnosis Date   Abnormal liver function tests 08/07/2016   Arthritis    b/l knees, bone on bone   Cirrhosis of liver (HCC) 12/21/2012   Confirmed via ultrasound per patient, she reports neg acute hepatitis panel    Cold sore 11/04/2016   Diabetes mellitus type 2 in obese 12/21/2012   takes Amaryl  daily   History of bronchitis 03/2015   History of colon polyps    benign   Hypertension    takes Metoprolol  and Lisinopril  daily   Insomnia    Pneumonia    hx of > 5 yrs ago   Pulmonary nodule 08/07/2016   Sinusitis, acute 01/26/2016   Vaginitis 10/30/2016   Vitamin D  deficiency 04/24/2016   Past Surgical History:  Procedure Laterality Date   ABDOMINAL HYSTERECTOMY  1996   BREAST BIOPSY     CHOLECYSTECTOMY N/A 09/06/2015   Procedure: LAPAROSCOPIC CHOLECYSTECTOMY;  Surgeon: Lynda Leos, MD;  Location: [facility] OR;  Service: General;  Laterality: N/A;   COLONOSCOPY WITH ESOPHAGOGASTRODUODENOSCOPY (EGD)     KNEE ARTHROSCOPY Bilateral    LEFT HEART CATH AND CORONARY ANGIOGRAPHY N/A 12/27/2016   Procedure: LEFT HEART CATH AND CORONARY ANGIOGRAPHY;  Surgeon: Wonda Sharper, MD;  Location: [facility] INVASIVE CV LAB;  Service: Cardiovascular;  Laterality: N/A;   TONSILLECTOMY     TUBAL LIGATION     Patient Active Problem List   Diagnosis Date Noted   Sensory hearing loss, bilateral 04/03/2023   Central  perforation of tympanic membrane of left ear 04/03/2023   Hordeolum externum of right lower eyelid 08/04/2017   Shortness of breath 12/27/2016   Exertional chest pain    Angina pectoris (HCC) 12/04/2016   Obstructive hypertrophic cardiomyopathy (HCC) 12/04/2016   Atypical chest pain 11/25/2016   Cold sore 11/04/2016   Vaginitis 10/30/2016   Abnormal liver function tests 08/07/2016   Pulmonary nodule 08/07/2016   Vitamin D  deficiency 04/24/2016   Hyperlipidemia 04/24/2016   Trigger finger, acquired 11/16/2015   Left knee pain 11/16/2015   RUQ pain 08/09/2015   Skin lesion of right lower extremity 08/09/2015   Snoring 06/29/2014   Cephalalgia 06/19/2014   Restless sleeper 06/19/2014   Type 2 diabetes mellitus with obesity (HCC) 12/21/2012   Cirrhosis of liver (HCC) 12/21/2012   Preventative health care 11/30/2012   History of colonic polyps 11/30/2012   HTN (hypertension) 11/30/2012   Insomnia    Obesity    Hot flash, menopausal    Arthritis    Allergy     PCP: Leila Lucie LABOR, MD REFERRING PROVIDER: Anita Bernardino BROCKS, FNP   REFERRING DIAG: R42 (ICD-10-CM) - Dizziness and giddiness   THERAPY DIAG:  Dizziness and giddiness  Unsteadiness on feet  ONSET DATE: 2 months ago  Rationale for  Evaluation and Treatment: Rehabilitation  SUBJECTIVE:   SUBJECTIVE STATEMENT: Had a bad spell yesterday that lasted a few moments and almost fell in the kitchen when tipped forward to pick up something from ground Pt accompanied by: self  PERTINENT HISTORY: arthritis bilat knees, HTN, DM, thrombocytopenia, cochlear implant 9 months ago  PAIN:  Are you having pain? No  PRECAUTIONS: Fall and Other: R cochlear implant  RED FLAGS: None   WEIGHT BEARING RESTRICTIONS: No  FALLS: Has patient fallen in last 6 months? Yes. Number of falls 1  LIVING ENVIRONMENT: Lives with: lives alone Lives in: House/apartment Stairs: 3 steps into home, single level home Has following equipment  at home: None  PLOF: Independent  PATIENT GOALS: To not be dizzy when I bend over.  OBJECTIVE:   TODAY'S TREATMENT: 09/26/23 Activity Comments  Loaded left DH Brief feeling of dizziness, no nystagmus  Loaded right DH No dizziness, no nystagmus  Brandt-Daroff x 5 reps Incr symptoms w/ arising vs sidelying  VOR x 1 Mor symptomatic horizontal vs vertical. Educated on symptom monitoring e.g. <4/10 dizziness  Corner balance For HEP additions        PATIENT EDUCATION: Education details: Eval results, POC initiated HEP and explained rationale/symptom progression for habituation Person educated: Patient Education method: Explanation, Demonstration, and Handouts Education comprehension: verbalized understanding and returned demonstration  HOME EXERCISE PROGRAM: Access Code: [email or web address] URL: [email or web address] Date: 09/26/2023 Prepared by: Kelly Byron Tipping  Exercises - Seated Gaze Stabilization with Head Rotation  - 2-3 x daily - 7 x weekly - 3 sets - 30 sec hold - Seated Gaze Stabilization with Head Nod  - 2-3 x daily - 7 x weekly - 1 sets - 30 sec hold - Brandt-Daroff Vestibular Exercise  - 1 x daily - 7 x weekly - 5 reps - Corner Balance Feet Together With Eyes Open  - 1 x daily - 7 x weekly - 3 sets - 30 sec hold - Corner Balance Feet Together With Eyes Closed  - 1 x daily - 7 x weekly - 3 sets - 30 sec hold - Corner Balance Feet Apart: Eyes Open With Head Turns  - 1 x daily - 7 x weekly - 3 sets - 30 sec hold - Corner Balance Feet Together: Eyes Closed With Head Turns  - 1 x daily - 7 x weekly - 3 sets - 30 sec hold  Note: Objective measures were completed at Evaluation unless otherwise noted.  DIAGNOSTIC FINDINGS: CT of head is normal  COGNITION: Overall cognitive status: Within functional limits for tasks assessed   POSTURE:  rounded shoulders and forward head  Cervical ROM:    Active A/PROM (deg) eval  Flexion 40  Extension 30  Right lateral flexion    Left lateral flexion   Right rotation 45  Left rotation 45  (Blank rows = not tested)   BED MOBILITY:  Independent, slow and guarded  TRANSFERS: Assistive device utilized: None  Sit to stand: Modified independence Stand to sit: Modified independence  GAIT: Gait pattern: step through pattern and trendelenburg Distance walked: 50 ft Assistive device utilized: None Level of assistance: Modified independence   PATIENT SURVEYS:  DHI: THE DIZZINESS HANDICAP INVENTORY (DHI)  P1. Does looking up increase your problem? 0 = No  E2. Because of your problem, do you feel frustrated? 4 = Yes  F3. Because of your problem, do you restrict your travel for business or recreation?  4 = Yes  P4. Does walking down the aisle of a  supermarket increase your problems?  4 = Yes  F5. Because of your problem, do you have difficulty getting into or out of bed?  0 = No  F6. Does your problem significantly restrict your participation in social activities, such as going out to dinner, going to the movies, dancing, or going to parties? 2 = Sometimes  F7. Because of your problem, do you have difficulty reading?  0 = No  P8. Does performing more ambitious activities such as sports, dancing, household chores (sweeping or putting dishes away) increase your problems?  2 = Sometimes  E9. Because of your problem, are you afraid to leave your home without having without having someone accompany you?  0 = No  E10. Because of your problem have you been embarrassed in front of others?  2 = Sometimes  P11. Do quick movements of your head increase your problem?  4 = Yes  F12. Because of your problem, do you avoid heights?  0 = No  P13. Does turning over in bed increase your problem?  0 = No  F14. Because of your problem, is it difficult for you to do strenuous homework or yard work? 2 = Sometimes  E15. Because of your problem, are you afraid people may think you are intoxicated? 4 = Yes  F16. Because of your problem, is  it difficult for you to go for a walk by yourself?  2 = Sometimes  P17. Does walking down a sidewalk increase your problem?  2 = Sometimes  E18.Because of your problem, is it difficult for you to concentrate 0 = No  F19. Because of your problem, is it difficult for you to walk around your house in the dark? 4 = Yes  E20. Because of your problem, are you afraid to stay home alone?  0 = No  E21. Because of your problem, do you feel handicapped? 2 = Sometimes  E22. Has the problem placed stress on your relationships with members of your family or friends? 0 = No  E23. Because of your problem, are you depressed?  2 = Sometimes  F24. Does your problem interfere with your job or household responsibilities?  2 = Sometimes  P25. Does bending over increase your problem?  4 = Yes  TOTAL 46    DHI Scoring Instructions  The patient is asked to answer each question as it pertains to dizziness or unsteadiness problems, specifically  considering their condition during the last month. Questions are designed to incorporate functional (F), physical  (P), and emotional (E) impacts on disability.   Scores greater than 10 points should be referred to balance specialists for further evaluation.   16-34 Points (mild handicap)  36-52 Points (moderate handicap)  54+ Points (severe handicap)  Minimally Detectable Change: 17 points ([address] [place], 1990)  Abiquiu, G. Stephanie. and Carnot-Moon, C. W. (1990). The development of the Dizziness Handicap Inventory. Archives of Otolaryngology - Head and Neck Surgery 116(4): [id number].   VESTIBULAR ASSESSMENT:  GENERAL OBSERVATION: Pt moves slowly, guarded, especially with bed mobility   SYMPTOM BEHAVIOR:  Subjective history: 2 months ago, pt reports she woke up to dizziness.  She has only experienced this previously just after her cochlear implant was placed (about 9 months ago).  She reports sensation as spinning, lightheaded, imbalanced; her symptoms are mostly  lightheadedness and imbalance now, that comes on with head motions L and R and bending over/looking up.  She does have hx of cochlear implant on R (approx 9 months ago),  fall and hitting her head (CT negative), antibiotics given approx 2-2.5 months ago due to fluid in her inner ear (pt states dizziness preceded this).   Non-Vestibular symptoms: NA  Type of dizziness: Spinning/Vertigo, Unsteady with head/body turns, Lightheadedness/Faint, and quick movements lag  Frequency: daily  Duration: lasts until I sit back down  Aggravating factors: Induced by position change: supine to sit, Induced by motion: looking up at the ceiling, bending down to the ground, turning body quickly, and turning head quickly, and worse in the afternoon  Relieving factors: rest and sitting down  Progression of symptoms: unchanged  OCULOMOTOR EXAM:  Ocular Alignment: normal  Ocular ROM: No Limitations  Spontaneous Nystagmus: absent  Gaze-Induced Nystagmus: absent  Smooth Pursuits: intact  Saccades: intact and except some quick, overshoot movements R eye  Convergence/Divergence: 2-3 cm    VESTIBULAR - OCULAR REFLEX:   Slow VOR: Comment: slowed horizontal, rates as 8/10 dizziness; no symptoms vertical  VOR Cancellation: Corrective Saccades to R side, 5/10 dizziness  Head-Impulse Test: HIT Right: positive HIT Left: positive  Dynamic Visual Acuity: NT   POSITIONAL TESTING: Right Dix-Hallpike: no nystagmus and reports dizziness getting into position Left Dix-Hallpike: no nystagmus and reports dizziness coming into position (pt comes into position very slowly, did not see nystagmus) Right Roll Test: no nystagmus and mild dizziness getting into position Left Roll Test: no nystagmus and mild dizziness getting into position    M-CTSIB  Condition 1: Firm Surface, EO 30 Sec, Normal Sway  Condition 2: Firm Surface, EC 30 Sec, Mild Sway  Condition 3: Foam Surface, EO 30 Sec, Mild Sway  Condition 4: Foam Surface, EC  8.29 Sec, Severe Sway    MOTION SENSITIVITY:  Motion Sensitivity Quotient Intensity: 0 = none, 1 = Lightheaded, 2 = Mild, 3 = Moderate, 4 = Severe, 5 = Vomiting  Intensity  1. Sitting to supine   2. Supine to L side   3. Supine to R side   4. Supine to sitting   5. L Hallpike-Dix   6. Up from L    7. R Hallpike-Dix   8. Up from R    9. Sitting, head tipped to L knee   10. Head up from L knee   11. Sitting, head tipped to R knee   12. Head up from R knee   13. Sitting head turns x5   14.Sitting head nods x5   15. In stance, 180 turn to L    16. In stance, 180 turn to R                                                                                                                                 TREATMENT DATE: 09/23/2023      GOALS: Goals reviewed with patient? Yes  SHORT TERM GOALS: Target date: 10/04/2023  Pt will be independent with HEP for improved dizziness, balance. Baseline: Goal status: INITIAL  LONG TERM GOALS:  Target date: 10/25/2023  Pt will be independent with HEP for improved dizziness, balance. Baseline:  Goal status: INITIAL  2.  DHI score to improve to less than or equal to 28, to demo decreased dizziness impacting daily activities. Baseline: 46 Goal status: INITIAL  3.  Pt will improve Condition 4 on MCTSIB to 30 seconds mod sway or better, to demo improved balance. Baseline: 8 sec Goal status: INITIAL  4.  Pt will improve FGA score to at least 23/30 to decrease fall risk. Baseline: TBD Goal status: INITIAL  5.  Pt will verbalize understanding of fall prevention in home environment.  Baseline:  Goal status: INITIAL    ASSESSMENT:  CLINICAL IMPRESSION: No positional vertigo present with experience of motion sensitivity with larger amplitude movements/change of position. Instructed in Brandt-Daroff for habituation with good return demonstration and addition to HEP.  Good recall to VOR x 1 with instruction in symptom monitoring and means  for feedback e.g. symptoms >4/10 and maintaining stable, focused image to adjust speed.  Corner balance multisensory conditions to improve postural awareness/stability and for habituation with good return demonstration and updates to HEP provided. Continued sessions to progress POC details  OBJECTIVE IMPAIRMENTS: Abnormal gait, decreased balance, difficulty walking, and dizziness.   ACTIVITY LIMITATIONS: bending, squatting, bed mobility, reach over head, and locomotion level  PARTICIPATION LIMITATIONS: meal prep, cleaning, laundry, community activity, school, and walking the dog  PERSONAL FACTORS: 3+ comorbidities: see above are also affecting patient's functional outcome.   REHAB POTENTIAL: Good  CLINICAL DECISION MAKING: Stable/uncomplicated  EVALUATION COMPLEXITY: Low   PLAN:  PT FREQUENCY: 2x/week  PT DURATION: other: 5 weeks, including eval week  PLANNED INTERVENTIONS: 97750- Physical Performance Testing, 97110-Therapeutic exercises, 97530- Therapeutic activity, [id number]- Neuromuscular re-education, 97535- Self Care, [id number]- Manual therapy, [phone]- Gait training, Patient/Family education, Balance training, and Vestibular training  PLAN FOR NEXT SESSION: HEP review and progressions for VOR. assess FGA.  May need to retest positional vertigo as symptoms warrant   Jonette MARLA Cordova, PT 09/26/2023, 8:47 AM   [facility] at [facility] [address] [place], [address] [place], [place] [address] Phone # [phone] Fax # [phone]

## 2023-10-02 NOTE — Therapy (Signed)
 OUTPATIENT PHYSICAL THERAPY VESTIBULAR TREATMENT     Patient Name: Stephanie Cordova MRN: [id number] DOB:10-Oct-1955, 68 y.o., female Today's Date: 10/03/2023  END OF SESSION:  PT End of Session - 10/03/23 1529     Visit Number 3    Number of Visits 10    Date for PT Re-Evaluation 10/25/23    Authorization Type BCBS Medicare    Progress Note Due on Visit 10    PT Start Time 1451    PT Stop Time 1529    PT Time Calculation (min) 38 min    Equipment Utilized During Treatment Gait belt    Activity Tolerance Patient tolerated treatment well    Behavior During Therapy WFL for tasks assessed/performed           Past Medical History:  Diagnosis Date   Abnormal liver function tests 08/07/2016   Arthritis    b/l knees, bone on bone   Cirrhosis of liver (HCC) 12/21/2012   Confirmed via ultrasound per patient, she reports neg acute hepatitis panel    Cold sore 11/04/2016   Diabetes mellitus type 2 in obese 12/21/2012   takes Amaryl  daily   History of bronchitis 03/2015   History of colon polyps    benign   Hypertension    takes Metoprolol  and Lisinopril  daily   Insomnia    Pneumonia    hx of > 5 yrs ago   Pulmonary nodule 08/07/2016   Sinusitis, acute 01/26/2016   Vaginitis 10/30/2016   Vitamin D  deficiency 04/24/2016   Past Surgical History:  Procedure Laterality Date   ABDOMINAL HYSTERECTOMY  1996   BREAST BIOPSY     CHOLECYSTECTOMY N/A 09/06/2015   Procedure: LAPAROSCOPIC CHOLECYSTECTOMY;  Surgeon: Lynda Leos, MD;  Location: [facility] OR;  Service: General;  Laterality: N/A;   COLONOSCOPY WITH ESOPHAGOGASTRODUODENOSCOPY (EGD)     KNEE ARTHROSCOPY Bilateral    LEFT HEART CATH AND CORONARY ANGIOGRAPHY N/A 12/27/2016   Procedure: LEFT HEART CATH AND CORONARY ANGIOGRAPHY;  Surgeon: Wonda Sharper, MD;  Location: [facility] INVASIVE CV LAB;  Service: Cardiovascular;  Laterality: N/A;   TONSILLECTOMY     TUBAL LIGATION     Patient Active Problem List   Diagnosis Date Noted   Sensory  hearing loss, bilateral 04/03/2023   Central perforation of tympanic membrane of left ear 04/03/2023   Hordeolum externum of right lower eyelid 08/04/2017   Shortness of breath 12/27/2016   Exertional chest pain    Angina pectoris (HCC) 12/04/2016   Obstructive hypertrophic cardiomyopathy (HCC) 12/04/2016   Atypical chest pain 11/25/2016   Cold sore 11/04/2016   Vaginitis 10/30/2016   Abnormal liver function tests 08/07/2016   Pulmonary nodule 08/07/2016   Vitamin D  deficiency 04/24/2016   Hyperlipidemia 04/24/2016   Trigger finger, acquired 11/16/2015   Left knee pain 11/16/2015   RUQ pain 08/09/2015   Skin lesion of right lower extremity 08/09/2015   Snoring 06/29/2014   Cephalalgia 06/19/2014   Restless sleeper 06/19/2014   Type 2 diabetes mellitus with obesity (HCC) 12/21/2012   Cirrhosis of liver (HCC) 12/21/2012   Preventative health care 11/30/2012   History of colonic polyps 11/30/2012   HTN (hypertension) 11/30/2012   Insomnia    Obesity    Hot flash, menopausal    Arthritis    Allergy     PCP: Leila Lucie LABOR, MD REFERRING PROVIDER: Anita Bernardino BROCKS, FNP   REFERRING DIAG: R42 (ICD-10-CM) - Dizziness and giddiness   THERAPY DIAG:  Dizziness and giddiness  Unsteadiness on  feet  ONSET DATE: 2 months ago  Rationale for Evaluation and Treatment: Rehabilitation  SUBJECTIVE:   SUBJECTIVE STATEMENT: It's better. HEP is going good, I'm doing them. Pt reports that she still feels dizzy when bending and coming up.    Pt accompanied by: self  PERTINENT HISTORY: arthritis bilat knees, HTN, DM, thrombocytopenia, cochlear implant 9 months ago  PAIN:  Are you having pain? No  PRECAUTIONS: Fall and Other: R cochlear implant  RED FLAGS: None   WEIGHT BEARING RESTRICTIONS: No  FALLS: Has patient fallen in last 6 months? Yes. Number of falls 1  LIVING ENVIRONMENT: Lives with: lives alone Lives in: House/apartment Stairs: 3 steps into home, single level  home Has following equipment at home: None  PLOF: Independent  PATIENT GOALS: To not be dizzy when I bend over.  OBJECTIVE:     TODAY'S TREATMENT: 10/03/23 Activity Comments  FGA 16/30  Sitting nose to knee R/L 3x each C/o 3-5/10 dizziness coming up; c/o R>L side   Gait training with SPC and quad tip cane x~250 ft  Min A and verbal cueing for cane sequencing. Initially started with cane in L hand, then moved to L which was better coordinated. Able to wean to SBA after practice       [facility] PT Assessment - 10/03/23 0001       Functional Gait  Assessment   Gait assessed  Yes    Gait Level Surface Walks 20 ft in less than 7 sec but greater than 5.5 sec, uses assistive device, slower speed, mild gait deviations, or deviates 6-10 in outside of the 12 in walkway width.    Change in Gait Speed Makes only minor adjustments to walking speed, or accomplishes a change in speed with significant gait deviations, deviates 10-15 in outside the 12 in walkway width, or changes speed but loses balance but is able to recover and continue walking.    Gait with Horizontal Head Turns Performs head turns with moderate changes in gait velocity, slows down, deviates 10-15 in outside 12 in walkway width but recovers, can continue to walk.    Gait with Vertical Head Turns Performs task with slight change in gait velocity (eg, minor disruption to smooth gait path), deviates 6 - 10 in outside 12 in walkway width or uses assistive device    Gait and Pivot Turn Pivot turns safely within 3 sec and stops quickly with no loss of balance.    Step Over Obstacle Is able to step over 2 stacked shoe boxes taped together (9 in total height) without changing gait speed. No evidence of imbalance.    Gait with Narrow Base of Support Ambulates less than 4 steps heel to toe or cannot perform without assistance.    Gait with Eyes Closed Walks 20 ft, slow speed, abnormal gait pattern, evidence for imbalance, deviates 10-15 in outside  12 in walkway width. Requires more than 9 sec to ambulate 20 ft.    Ambulating Backwards Walks 20 ft, uses assistive device, slower speed, mild gait deviations, deviates 6-10 in outside 12 in walkway width.    Steps Two feet to a stair, must use rail.    Total Score 16           PATIENT EDUCATION: Education details: edu on exam findings and falls risk, need for hip strengthening to address lateral lean with gait and balance, edu on benefits of habituation on dizziness, edu on vestibular hypofunction, HEP update with edu for safety; handout on quad  tip cane which pt preferred today  Person educated: Patient Education method: Explanation, Demonstration, Tactile cues, Verbal cues, and Handouts Education comprehension: verbalized understanding and returned demonstration   HOME EXERCISE PROGRAM: Access Code: [email or web address] URL: [email or web address] Date: 10/03/2023 Prepared by: [facility] - Outpatient  Rehab - [facility]  Exercises - Seated Gaze Stabilization with Head Rotation  - 2-3 x daily - 7 x weekly - 3 sets - 30 sec hold - Seated Gaze Stabilization with Head Nod  - 2-3 x daily - 7 x weekly - 1 sets - 30 sec hold - Brandt-Daroff Vestibular Exercise  - 1 x daily - 7 x weekly - 5 reps - Corner Balance Feet Together With Eyes Open  - 1 x daily - 7 x weekly - 3 sets - 30 sec hold - Corner Balance Feet Together With Eyes Closed  - 1 x daily - 7 x weekly - 3 sets - 30 sec hold - Corner Balance Feet Apart: Eyes Open With Head Turns  - 1 x daily - 7 x weekly - 3 sets - 30 sec hold - Corner Balance Feet Together: Eyes Closed With Head Turns  - 1 x daily - 7 x weekly - 3 sets - 30 sec hold - Seated Nose to Left Knee Vestibular Habituation  - 1 x daily - 5 x weekly - 2 sets - 3-5 reps - 10 sec  hold - Seated Nose to Right Knee Vestibular Habituation  - 1 x daily - 5 x weekly - 2 sets - 3-5 reps - 10 sec  hold     Note: Objective measures were completed at Evaluation unless  otherwise noted.  DIAGNOSTIC FINDINGS: CT of head is normal  COGNITION: Overall cognitive status: Within functional limits for tasks assessed   POSTURE:  rounded shoulders and forward head  Cervical ROM:    Active A/PROM (deg) eval  Flexion 40  Extension 30  Right lateral flexion   Left lateral flexion   Right rotation 45  Left rotation 45  (Blank rows = not tested)   BED MOBILITY:  Independent, slow and guarded  TRANSFERS: Assistive device utilized: None  Sit to stand: Modified independence Stand to sit: Modified independence  GAIT: Gait pattern: step through pattern and trendelenburg Distance walked: 50 ft Assistive device utilized: None Level of assistance: Modified independence   PATIENT SURVEYS:  DHI: THE DIZZINESS HANDICAP INVENTORY (DHI)  P1. Does looking up increase your problem? 0 = No  E2. Because of your problem, do you feel frustrated? 4 = Yes  F3. Because of your problem, do you restrict your travel for business or recreation?  4 = Yes  P4. Does walking down the aisle of a supermarket increase your problems?  4 = Yes  F5. Because of your problem, do you have difficulty getting into or out of bed?  0 = No  F6. Does your problem significantly restrict your participation in social activities, such as going out to dinner, going to the movies, dancing, or going to parties? 2 = Sometimes  F7. Because of your problem, do you have difficulty reading?  0 = No  P8. Does performing more ambitious activities such as sports, dancing, household chores (sweeping or putting dishes away) increase your problems?  2 = Sometimes  E9. Because of your problem, are you afraid to leave your home without having without having someone accompany you?  0 = No  E10. Because of your problem have you been embarrassed in front of others?  2 = Sometimes  P11. Do quick movements of your head increase your problem?  4 = Yes  F12. Because of your problem, do you avoid heights?  0 = No   P13. Does turning over in bed increase your problem?  0 = No  F14. Because of your problem, is it difficult for you to do strenuous homework or yard work? 2 = Sometimes  E15. Because of your problem, are you afraid people may think you are intoxicated? 4 = Yes  F16. Because of your problem, is it difficult for you to go for a walk by yourself?  2 = Sometimes  P17. Does walking down a sidewalk increase your problem?  2 = Sometimes  E18.Because of your problem, is it difficult for you to concentrate 0 = No  F19. Because of your problem, is it difficult for you to walk around your house in the dark? 4 = Yes  E20. Because of your problem, are you afraid to stay home alone?  0 = No  E21. Because of your problem, do you feel handicapped? 2 = Sometimes  E22. Has the problem placed stress on your relationships with members of your family or friends? 0 = No  E23. Because of your problem, are you depressed?  2 = Sometimes  F24. Does your problem interfere with your job or household responsibilities?  2 = Sometimes  P25. Does bending over increase your problem?  4 = Yes  TOTAL 46    DHI Scoring Instructions  The patient is asked to answer each question as it pertains to dizziness or unsteadiness problems, specifically  considering their condition during the last month. Questions are designed to incorporate functional (F), physical  (P), and emotional (E) impacts on disability.   Scores greater than 10 points should be referred to balance specialists for further evaluation.   16-34 Points (mild handicap)  36-52 Points (moderate handicap)  54+ Points (severe handicap)  Minimally Detectable Change: 17 points ([address] [place], 1990)  [place], G. SHAUNNA. and [place], C. W. (1990). The development of the Dizziness Handicap Inventory. Archives of Otolaryngology - Head and Neck Surgery 116(4): [id number].   VESTIBULAR ASSESSMENT:  GENERAL OBSERVATION: Pt moves slowly, guarded, especially with bed  mobility   SYMPTOM BEHAVIOR:  Subjective history: 2 months ago, pt reports she woke up to dizziness.  She has only experienced this previously just after her cochlear implant was placed (about 9 months ago).  She reports sensation as spinning, lightheaded, imbalanced; her symptoms are mostly lightheadedness and imbalance now, that comes on with head motions L and R and bending over/looking up.  She does have hx of cochlear implant on R (approx 9 months ago), fall and hitting her head (CT negative), antibiotics given approx 2-2.5 months ago due to fluid in her inner ear (pt states dizziness preceded this).   Non-Vestibular symptoms: NA  Type of dizziness: Spinning/Vertigo, Unsteady with head/body turns, Lightheadedness/Faint, and quick movements lag  Frequency: daily  Duration: lasts until I sit back down  Aggravating factors: Induced by position change: supine to sit, Induced by motion: looking up at the ceiling, bending down to the ground, turning body quickly, and turning head quickly, and worse in the afternoon  Relieving factors: rest and sitting down  Progression of symptoms: unchanged  OCULOMOTOR EXAM:  Ocular Alignment: normal  Ocular ROM: No Limitations  Spontaneous Nystagmus: absent  Gaze-Induced Nystagmus: absent  Smooth Pursuits: intact  Saccades: intact and except some quick, overshoot movements R eye  Convergence/Divergence: 2-3 cm    VESTIBULAR - OCULAR REFLEX:   Slow VOR: Comment: slowed horizontal, rates as 8/10 dizziness; no symptoms vertical  VOR Cancellation: Corrective Saccades to R side, 5/10 dizziness  Head-Impulse Test: HIT Right: positive HIT Left: positive  Dynamic Visual Acuity: NT   POSITIONAL TESTING: Right Dix-Hallpike: no nystagmus and reports dizziness getting into position Left Dix-Hallpike: no nystagmus and reports dizziness coming into position (pt comes into position very slowly, did not see nystagmus) Right Roll Test: no nystagmus and mild dizziness  getting into position Left Roll Test: no nystagmus and mild dizziness getting into position    M-CTSIB  Condition 1: Firm Surface, EO 30 Sec, Normal Sway  Condition 2: Firm Surface, EC 30 Sec, Mild Sway  Condition 3: Foam Surface, EO 30 Sec, Mild Sway  Condition 4: Foam Surface, EC 8.29 Sec, Severe Sway    MOTION SENSITIVITY:  Motion Sensitivity Quotient Intensity: 0 = none, 1 = Lightheaded, 2 = Mild, 3 = Moderate, 4 = Severe, 5 = Vomiting  Intensity  1. Sitting to supine   2. Supine to L side   3. Supine to R side   4. Supine to sitting   5. L Hallpike-Dix   6. Up from L    7. R Hallpike-Dix   8. Up from R    9. Sitting, head tipped to L knee   10. Head up from L knee   11. Sitting, head tipped to R knee   12. Head up from R knee   13. Sitting head turns x5   14.Sitting head nods x5   15. In stance, 180 turn to L    16. In stance, 180 turn to R                                                                                                                                 TREATMENT DATE: 09/23/2023      GOALS: Goals reviewed with patient? Yes  SHORT TERM GOALS: Target date: 10/04/2023  Pt will be independent with HEP for improved dizziness, balance. Baseline: pt reports compliance and benefit from HEP 10/03/23 Goal status: MET 10/03/23  LONG TERM GOALS: Target date: 10/25/2023  Pt will be independent with HEP for improved dizziness, balance. Baseline:  Goal status: IN PROGRESS  2.  DHI score to improve to less than or equal to 28, to demo decreased dizziness impacting daily activities. Baseline: 46 Goal status: IN PROGRESS  3.  Pt will improve Condition 4 on MCTSIB to 30 seconds mod sway or better, to demo improved balance. Baseline: 8 sec Goal status: IN PROGRESS  4.  Pt will improve FGA score to at least 23/30 to decrease fall risk. Baseline: TBD Goal status: IN PROGRESS  5.  Pt will verbalize understanding of fall prevention in home environment.   Baseline:  Goal status: IN PROGRESS    ASSESSMENT:  CLINICAL IMPRESSION: Patient arrived to session  with report of improvement in dizziness. Patient scored 16/30 on FGA, indicating an increased risk of falls. Educated pt on falls risk and initiated gait training with canes. Patient required cueing for proper sequencing and ultimately safest and most consistent with quad tip cane in R hand. Habituation activities worked on addressing dizziness with bending tasks. Patient tolerated session well and without complaints upon leaving.   OBJECTIVE IMPAIRMENTS: Abnormal gait, decreased balance, difficulty walking, and dizziness.   ACTIVITY LIMITATIONS: bending, squatting, bed mobility, reach over head, and locomotion level  PARTICIPATION LIMITATIONS: meal prep, cleaning, laundry, community activity, school, and walking the dog  PERSONAL FACTORS: 3+ comorbidities: see above are also affecting patient's functional outcome.   REHAB POTENTIAL: Good  CLINICAL DECISION MAKING: Stable/uncomplicated  EVALUATION COMPLEXITY: Low   PLAN:  PT FREQUENCY: 2x/week  PT DURATION: other: 5 weeks, including eval week  PLANNED INTERVENTIONS: 97750- Physical Performance Testing, 97110-Therapeutic exercises, 97530- Therapeutic activity, [id number]- Neuromuscular re-education, 97535- Self Care, [id number]- Manual therapy, [id number]- Gait training, Patient/Family education, Balance training, and Vestibular training  PLAN FOR NEXT SESSION: HEP review and progressions for VOR. assess FGA.  May need to retest positional vertigo as symptoms warrant   Louana Terrilyn Christians, PT, DPT 10/03/23 4:22 PM  [facility] at [facility] [address], [address] [place], [place] [address] Phone # [phone] Fax # [phone]

## 2023-10-03 ENCOUNTER — Ambulatory Visit: Attending: Nurse Practitioner | Admitting: Physical Therapy

## 2023-10-03 ENCOUNTER — Encounter: Payer: Self-pay | Admitting: Physical Therapy

## 2023-10-03 DIAGNOSIS — R2681 Unsteadiness on feet: Secondary | ICD-10-CM | POA: Diagnosis present

## 2023-10-03 DIAGNOSIS — R42 Dizziness and giddiness: Secondary | ICD-10-CM | POA: Insufficient documentation

## 2023-10-07 ENCOUNTER — Ambulatory Visit: Admitting: Physical Therapy

## 2023-10-07 ENCOUNTER — Encounter: Payer: Self-pay | Admitting: Physical Therapy

## 2023-10-07 DIAGNOSIS — R42 Dizziness and giddiness: Secondary | ICD-10-CM

## 2023-10-07 DIAGNOSIS — R2681 Unsteadiness on feet: Secondary | ICD-10-CM

## 2023-10-07 NOTE — Therapy (Signed)
 OUTPATIENT PHYSICAL THERAPY VESTIBULAR TREATMENT     Patient Name: Stephanie Cordova MRN: [id number] DOB:04-11-1955, 68 y.o., female Today's Date: 10/07/2023  END OF SESSION:  PT End of Session - 10/07/23 1021     Visit Number 4    Number of Visits 10    Date for PT Re-Evaluation 10/25/23    Authorization Type BCBS Medicare    Progress Note Due on Visit 10    PT Start Time 1021    PT Stop Time 1059    PT Time Calculation (min) 38 min    Equipment Utilized During Treatment Gait belt    Activity Tolerance Patient tolerated treatment well    Behavior During Therapy WFL for tasks assessed/performed            Past Medical History:  Diagnosis Date   Abnormal liver function tests 08/07/2016   Arthritis    b/l knees, bone on bone   Cirrhosis of liver (HCC) 12/21/2012   Confirmed via ultrasound per patient, she reports neg acute hepatitis panel    Cold sore 11/04/2016   Diabetes mellitus type 2 in obese 12/21/2012   takes Amaryl  daily   History of bronchitis 03/2015   History of colon polyps    benign   Hypertension    takes Metoprolol  and Lisinopril  daily   Insomnia    Pneumonia    hx of > 5 yrs ago   Pulmonary nodule 08/07/2016   Sinusitis, acute 01/26/2016   Vaginitis 10/30/2016   Vitamin D  deficiency 04/24/2016   Past Surgical History:  Procedure Laterality Date   ABDOMINAL HYSTERECTOMY  1996   BREAST BIOPSY     CHOLECYSTECTOMY N/A 09/06/2015   Procedure: LAPAROSCOPIC CHOLECYSTECTOMY;  Surgeon: Lynda Leos, MD;  Location: [facility] OR;  Service: General;  Laterality: N/A;   COLONOSCOPY WITH ESOPHAGOGASTRODUODENOSCOPY (EGD)     KNEE ARTHROSCOPY Bilateral    LEFT HEART CATH AND CORONARY ANGIOGRAPHY N/A 12/27/2016   Procedure: LEFT HEART CATH AND CORONARY ANGIOGRAPHY;  Surgeon: Wonda Sharper, MD;  Location: [facility] INVASIVE CV LAB;  Service: Cardiovascular;  Laterality: N/A;   TONSILLECTOMY     TUBAL LIGATION     Patient Active Problem List   Diagnosis Date Noted   Sensory  hearing loss, bilateral 04/03/2023   Central perforation of tympanic membrane of left ear 04/03/2023   Hordeolum externum of right lower eyelid 08/04/2017   Shortness of breath 12/27/2016   Exertional chest pain    Angina pectoris (HCC) 12/04/2016   Obstructive hypertrophic cardiomyopathy (HCC) 12/04/2016   Atypical chest pain 11/25/2016   Cold sore 11/04/2016   Vaginitis 10/30/2016   Abnormal liver function tests 08/07/2016   Pulmonary nodule 08/07/2016   Vitamin D  deficiency 04/24/2016   Hyperlipidemia 04/24/2016   Trigger finger, acquired 11/16/2015   Left knee pain 11/16/2015   RUQ pain 08/09/2015   Skin lesion of right lower extremity 08/09/2015   Snoring 06/29/2014   Cephalalgia 06/19/2014   Restless sleeper 06/19/2014   Type 2 diabetes mellitus with obesity (HCC) 12/21/2012   Cirrhosis of liver (HCC) 12/21/2012   Preventative health care 11/30/2012   History of colonic polyps 11/30/2012   HTN (hypertension) 11/30/2012   Insomnia    Obesity    Hot flash, menopausal    Arthritis    Allergy     PCP: Leila Lucie LABOR, MD REFERRING PROVIDER: Anita Bernardino BROCKS, FNP   REFERRING DIAG: R42 (ICD-10-CM) - Dizziness and giddiness   THERAPY DIAG:  Dizziness and giddiness  Unsteadiness  on feet  ONSET DATE: 2 months ago  Rationale for Evaluation and Treatment: Rehabilitation  SUBJECTIVE:   SUBJECTIVE STATEMENT: Dizziness is better.  Still present sometimes.  Still side to side is the worst. Able to walk the dog again.  Have started to use the cane on uneven surfaces.    Pt accompanied by: self  PERTINENT HISTORY: arthritis bilat knees, HTN, DM, thrombocytopenia, cochlear implant 9 months ago  PAIN:  Are you having pain? No  PRECAUTIONS: Fall and Other: R cochlear implant  RED FLAGS: None   WEIGHT BEARING RESTRICTIONS: No  FALLS: Has patient fallen in last 6 months? Yes. Number of falls 1  LIVING ENVIRONMENT: Lives with: lives alone Lives in:  House/apartment Stairs: 3 steps into home, single level home Has following equipment at home: None  PLOF: Independent  PATIENT GOALS: To not be dizzy when I bend over.  OBJECTIVE:   Rates dizziness as 0/10  TODAY'S TREATMENT: 10/07/2023 Activity Comments  Reviewed seated nose to knee exercise 3/10 rating of dizziness, 2 reps  Reviewed seated gaze stabilization 30 sec horizontal and vertical Dizziness, but resolves very quickly  Standing gaze stabilization x 30 sec horizontal 30 sec vertical 5/10, dizziness and resolves quickly(horizontal) 5/10 dizziness and resolves quickly (vertical)   Standing horizontal head turns to targets, 2 x 5 reps Mild dizziness, worse to R side, per report  Forward/back walking at counter   Tandem gait at counter   Gait forward with EC at counter Increased sway and unsteadiness  Sidestepping along counter, no resistance, then added yellow band Alternating step taps to cones, 10 reps For hip stability  Short distance gait and turns 4 reps each direction More unsteadiness to R             PATIENT EDUCATION: Education details: review and progression of HEP; reiterated best option is to use cane for stability and balance Person educated: Patient Education method: Explanation, Demonstration, Tactile cues, Verbal cues, and Handouts Education comprehension: verbalized understanding and returned demonstration   HOME EXERCISE PROGRAM:  Access Code: [email or web address] URL: [email or web address] Date: 10/07/2023 Prepared by: [facility] - Outpatient  Rehab - [facility]  Exercises - Seated Gaze Stabilization with Head Rotation  - 2-3 x daily - 7 x weekly - 3 sets - 30 sec hold - Seated Gaze Stabilization with Head Nod  - 2-3 x daily - 7 x weekly - 1 sets - 30 sec hold - Brandt-Daroff Vestibular Exercise  - 1 x daily - 7 x weekly - 5 reps - Corner Balance Feet Together With Eyes Open  - 1 x daily - 7 x weekly - 3 sets - 30 sec hold - Corner  Balance Feet Together With Eyes Closed  - 1 x daily - 7 x weekly - 3 sets - 30 sec hold - Corner Balance Feet Apart: Eyes Open With Head Turns  - 1 x daily - 7 x weekly - 3 sets - 30 sec hold - Corner Balance Feet Together: Eyes Closed With Head Turns  - 1 x daily - 7 x weekly - 3 sets - 30 sec hold - Seated Nose to Left Knee Vestibular Habituation  - 1 x daily - 5 x weekly - 2 sets - 3-5 reps - 10 sec  hold - Seated Nose to Right Knee Vestibular Habituation  - 1 x daily - 5 x weekly - 2 sets - 3-5 reps - 10 sec  hold - Side Stepping with Resistance at Ankles and Counter Support  -  1 x daily - 5 x weekly - 1 sets - 3-5 reps     Note: Objective measures were completed at Evaluation unless otherwise noted.  DIAGNOSTIC FINDINGS: CT of head is normal  COGNITION: Overall cognitive status: Within functional limits for tasks assessed   POSTURE:  rounded shoulders and forward head  Cervical ROM:    Active A/PROM (deg) eval  Flexion 40  Extension 30  Right lateral flexion   Left lateral flexion   Right rotation 45  Left rotation 45  (Blank rows = not tested)   BED MOBILITY:  Independent, slow and guarded  TRANSFERS: Assistive device utilized: None  Sit to stand: Modified independence Stand to sit: Modified independence  GAIT: Gait pattern: step through pattern and trendelenburg Distance walked: 50 ft Assistive device utilized: None Level of assistance: Modified independence   PATIENT SURVEYS:  DHI: THE DIZZINESS HANDICAP INVENTORY (DHI)  P1. Does looking up increase your problem? 0 = No  E2. Because of your problem, do you feel frustrated? 4 = Yes  F3. Because of your problem, do you restrict your travel for business or recreation?  4 = Yes  P4. Does walking down the aisle of a supermarket increase your problems?  4 = Yes  F5. Because of your problem, do you have difficulty getting into or out of bed?  0 = No  F6. Does your problem significantly restrict your  participation in social activities, such as going out to dinner, going to the movies, dancing, or going to parties? 2 = Sometimes  F7. Because of your problem, do you have difficulty reading?  0 = No  P8. Does performing more ambitious activities such as sports, dancing, household chores (sweeping or putting dishes away) increase your problems?  2 = Sometimes  E9. Because of your problem, are you afraid to leave your home without having without having someone accompany you?  0 = No  E10. Because of your problem have you been embarrassed in front of others?  2 = Sometimes  P11. Do quick movements of your head increase your problem?  4 = Yes  F12. Because of your problem, do you avoid heights?  0 = No  P13. Does turning over in bed increase your problem?  0 = No  F14. Because of your problem, is it difficult for you to do strenuous homework or yard work? 2 = Sometimes  E15. Because of your problem, are you afraid people may think you are intoxicated? 4 = Yes  F16. Because of your problem, is it difficult for you to go for a walk by yourself?  2 = Sometimes  P17. Does walking down a sidewalk increase your problem?  2 = Sometimes  E18.Because of your problem, is it difficult for you to concentrate 0 = No  F19. Because of your problem, is it difficult for you to walk around your house in the dark? 4 = Yes  E20. Because of your problem, are you afraid to stay home alone?  0 = No  E21. Because of your problem, do you feel handicapped? 2 = Sometimes  E22. Has the problem placed stress on your relationships with members of your family or friends? 0 = No  E23. Because of your problem, are you depressed?  2 = Sometimes  F24. Does your problem interfere with your job or household responsibilities?  2 = Sometimes  P25. Does bending over increase your problem?  4 = Yes  TOTAL 46    DHI  Scoring Instructions  The patient is asked to answer each question as it pertains to dizziness or unsteadiness problems,  specifically  considering their condition during the last month. Questions are designed to incorporate functional (F), physical  (P), and emotional (E) impacts on disability.   Scores greater than 10 points should be referred to balance specialists for further evaluation.   16-34 Points (mild handicap)  36-52 Points (moderate handicap)  54+ Points (severe handicap)  Minimally Detectable Change: 17 points ([address] [place], 1990)  [place], G. SHAUNNA. and [place], C. W. (1990). The development of the Dizziness Handicap Inventory. Archives of Otolaryngology - Head and Neck Surgery 116(4): [id number].   VESTIBULAR ASSESSMENT:  GENERAL OBSERVATION: Pt moves slowly, guarded, especially with bed mobility   SYMPTOM BEHAVIOR:  Subjective history: 2 months ago, pt reports she woke up to dizziness.  She has only experienced this previously just after her cochlear implant was placed (about 9 months ago).  She reports sensation as spinning, lightheaded, imbalanced; her symptoms are mostly lightheadedness and imbalance now, that comes on with head motions L and R and bending over/looking up.  She does have hx of cochlear implant on R (approx 9 months ago), fall and hitting her head (CT negative), antibiotics given approx 2-2.5 months ago due to fluid in her inner ear (pt states dizziness preceded this).   Non-Vestibular symptoms: NA  Type of dizziness: Spinning/Vertigo, Unsteady with head/body turns, Lightheadedness/Faint, and quick movements lag  Frequency: daily  Duration: lasts until I sit back down  Aggravating factors: Induced by position change: supine to sit, Induced by motion: looking up at the ceiling, bending down to the ground, turning body quickly, and turning head quickly, and worse in the afternoon  Relieving factors: rest and sitting down  Progression of symptoms: unchanged  OCULOMOTOR EXAM:  Ocular Alignment: normal  Ocular ROM: No Limitations  Spontaneous Nystagmus:  absent  Gaze-Induced Nystagmus: absent  Smooth Pursuits: intact  Saccades: intact and except some quick, overshoot movements R eye  Convergence/Divergence: 2-3 cm    VESTIBULAR - OCULAR REFLEX:   Slow VOR: Comment: slowed horizontal, rates as 8/10 dizziness; no symptoms vertical  VOR Cancellation: Corrective Saccades to R side, 5/10 dizziness  Head-Impulse Test: HIT Right: positive HIT Left: positive  Dynamic Visual Acuity: NT   POSITIONAL TESTING: Right Dix-Hallpike: no nystagmus and reports dizziness getting into position Left Dix-Hallpike: no nystagmus and reports dizziness coming into position (pt comes into position very slowly, did not see nystagmus) Right Roll Test: no nystagmus and mild dizziness getting into position Left Roll Test: no nystagmus and mild dizziness getting into position    M-CTSIB  Condition 1: Firm Surface, EO 30 Sec, Normal Sway  Condition 2: Firm Surface, EC 30 Sec, Mild Sway  Condition 3: Foam Surface, EO 30 Sec, Mild Sway  Condition 4: Foam Surface, EC 8.29 Sec, Severe Sway    MOTION SENSITIVITY:  Motion Sensitivity Quotient Intensity: 0 = none, 1 = Lightheaded, 2 = Mild, 3 = Moderate, 4 = Severe, 5 = Vomiting  Intensity  1. Sitting to supine   2. Supine to L side   3. Supine to R side   4. Supine to sitting   5. L Hallpike-Dix   6. Up from L    7. R Hallpike-Dix   8. Up from R    9. Sitting, head tipped to L knee   10. Head up from L knee   11. Sitting, head tipped to R knee  12. Head up from R knee   13. Sitting head turns x5   14.Sitting head nods x5   15. In stance, 180 turn to L    16. In stance, 180 turn to R                                                                                                                                 TREATMENT DATE: 09/23/2023      GOALS: Goals reviewed with patient? Yes  SHORT TERM GOALS: Target date: 10/04/2023  Pt will be independent with HEP for improved dizziness,  balance. Baseline: pt reports compliance and benefit from HEP 10/03/23 Goal status: MET 10/03/23  LONG TERM GOALS: Target date: 10/25/2023  Pt will be independent with HEP for improved dizziness, balance. Baseline:  Goal status: IN PROGRESS  2.  DHI score to improve to less than or equal to 28, to demo decreased dizziness impacting daily activities. Baseline: 46 Goal status: IN PROGRESS  3.  Pt will improve Condition 4 on MCTSIB to 30 seconds mod sway or better, to demo improved balance. Baseline: 8 sec Goal status: IN PROGRESS  4.  Pt will improve FGA score to at least 23/30 to decrease fall risk. Baseline: TBD Goal status: IN PROGRESS  5.  Pt will verbalize understanding of fall prevention in home environment.  Baseline:  Goal status: IN PROGRESS    ASSESSMENT:  CLINICAL IMPRESSION: Pt presents today and reports improvement in dizziness overall. Skilled PT session focused on review of HEP and progression of standing exercises.  Performed standing gaze stabilization and then worked on dynamic balance and hip strengthening.  Head motions horizontally tend to increase dizziness, but it resolves quickly.  She has Trendelenburg pattern with gait and she notes fatigue in hip abduction with resisted exercise. She will continue to benefit from skilled PT towards goals for improved functional mobility and decreased fall risk.    OBJECTIVE IMPAIRMENTS: Abnormal gait, decreased balance, difficulty walking, and dizziness.   ACTIVITY LIMITATIONS: bending, squatting, bed mobility, reach over head, and locomotion level  PARTICIPATION LIMITATIONS: meal prep, cleaning, laundry, community activity, school, and walking the dog  PERSONAL FACTORS: 3+ comorbidities: see above are also affecting patient's functional outcome.   REHAB POTENTIAL: Good  CLINICAL DECISION MAKING: Stable/uncomplicated  EVALUATION COMPLEXITY: Low   PLAN:  PT FREQUENCY: 2x/week  PT DURATION: other: 5 weeks,  including eval week  PLANNED INTERVENTIONS: 97750- Physical Performance Testing, 97110-Therapeutic exercises, 97530- Therapeutic activity, [id number]- Neuromuscular re-education, 97535- Self Care, [id number]- Manual therapy, [phone]- Gait training, Patient/Family education, Balance training, and Vestibular training  PLAN FOR NEXT SESSION: Work on 180 turns/stop (harder to R today).  HEP review and progressions for VOR.   May need to retest positional vertigo as symptoms warrant   Greig Anon, PT 10/07/23 10:59 AM Phone: [phone] Fax: [phone]   [facility] at [facility] [address] [place], [address] [place], [place] [address]  Phone # [phone] Fax # [phone]

## 2023-10-08 ENCOUNTER — Inpatient Hospital Stay: Attending: Hematology & Oncology

## 2023-10-08 ENCOUNTER — Inpatient Hospital Stay (HOSPITAL_BASED_OUTPATIENT_CLINIC_OR_DEPARTMENT_OTHER): Admitting: Hematology & Oncology

## 2023-10-08 ENCOUNTER — Encounter: Payer: Self-pay | Admitting: Hematology & Oncology

## 2023-10-08 VITALS — BP 120/54 | HR 90 | Temp 98.7°F | Resp 20 | Ht 61.0 in | Wt 192.0 lb

## 2023-10-08 DIAGNOSIS — K7460 Unspecified cirrhosis of liver: Secondary | ICD-10-CM | POA: Diagnosis not present

## 2023-10-08 DIAGNOSIS — D696 Thrombocytopenia, unspecified: Secondary | ICD-10-CM

## 2023-10-08 DIAGNOSIS — K769 Liver disease, unspecified: Secondary | ICD-10-CM

## 2023-10-08 DIAGNOSIS — K7581 Nonalcoholic steatohepatitis (NASH): Secondary | ICD-10-CM | POA: Insufficient documentation

## 2023-10-08 DIAGNOSIS — Z79899 Other long term (current) drug therapy: Secondary | ICD-10-CM | POA: Insufficient documentation

## 2023-10-08 LAB — CMP (CANCER CENTER ONLY)
ALT: 21 U/L (ref 0–44)
AST: 33 U/L (ref 15–41)
Albumin: 3.9 g/dL (ref 3.5–5.0)
Alkaline Phosphatase: 119 U/L (ref 38–126)
Anion gap: 10 (ref 5–15)
BUN: 12 mg/dL (ref 8–23)
CO2: 26 mmol/L (ref 22–32)
Calcium: 9 mg/dL (ref 8.9–10.3)
Chloride: 101 mmol/L (ref 98–111)
Creatinine: 0.78 mg/dL (ref 0.44–1.00)
GFR, Estimated: >60 mL/min (ref >60)
Glucose, Bld: 329 mg/dL — ABNORMAL HIGH (ref 70–99)
Potassium: 4.6 mmol/L (ref 3.5–5.1)
Sodium: 137 mmol/L (ref 135–145)
Total Bilirubin: 0.9 mg/dL (ref 0.0–1.2)
Total Protein: 6.2 g/dL — ABNORMAL LOW (ref 6.5–8.1)

## 2023-10-08 LAB — CBC WITH DIFFERENTIAL (CANCER CENTER ONLY)
Abs Immature Granulocytes: 0.03 K/uL (ref 0.00–0.07)
Basophils Absolute: 0 K/uL (ref 0.0–0.1)
Basophils Relative: 1 %
Eosinophils Absolute: 0.1 K/uL (ref 0.0–0.5)
Eosinophils Relative: 2 %
HCT: 35.7 % — ABNORMAL LOW (ref 36.0–46.0)
Hemoglobin: 12.1 g/dL (ref 12.0–15.0)
Immature Granulocytes: 1 %
Lymphocytes Relative: 11 %
Lymphs Abs: 0.7 K/uL (ref 0.7–4.0)
MCH: 30.5 pg (ref 26.0–34.0)
MCHC: 33.9 g/dL (ref 30.0–36.0)
MCV: 89.9 fL (ref 80.0–100.0)
Monocytes Absolute: 0.5 K/uL (ref 0.1–1.0)
Monocytes Relative: 7 %
Neutro Abs: 4.9 K/uL (ref 1.7–7.7)
Neutrophils Relative %: 78 %
Platelet Count: 99 K/uL — ABNORMAL LOW (ref 150–400)
RBC: 3.97 MIL/uL (ref 3.87–5.11)
RDW: 13.1 % (ref 11.5–15.5)
WBC Count: 6.2 K/uL (ref 4.0–10.5)
nRBC: 0 % (ref 0.0–0.2)

## 2023-10-08 LAB — SAVE SMEAR(SSMR), FOR PROVIDER SLIDE REVIEW

## 2023-10-08 NOTE — Progress Notes (Signed)
 Hematology and Oncology Follow Up Visit  Stephanie Cordova [id number] 1955/05/13 68 y.o. 10/08/2023   Principle Diagnosis:  Thrombocytopenia-likely secondary to hepatic cirrhosis-NASH  Current Therapy:   Observation     Interim History:  Stephanie Cordova is back for follow-up.  This is her second visit.  We first saw her back in July..  She is after I saw her, she had a CT scan.  This was of the abdomen pelvis.  This showed her underlying cirrhosis.  However, there was a suspected mass in segment 6 and 7.  Unfortunately, she cannot have an MRI because she has a cochlear implant..  There is also some splenomegaly.  There is no adenopathy.  She had some tumor markers done.  CA 19-9 was elevated at 70.  I am not sure exactly what this signifies..  She had a normal alpha-fetoprotein of 2.3.  She probably needs to have a PET scan done.  I think this to be reasonable.  The problem there is that she has horrible diabetes.  Her blood sugar today is about 329.  She says she is going to work on getting her blood sugars down.  She is going to get back on Mounjaro.  She has had no abdominal pain.  She has had no bleeding.  She has had no nausea or vomiting.  She has had no cough or shortness of breath.  She has had no rashes.  Has been no leg swelling.  Overall, I would say that her performance status is probably ECOG 1.  Medications:  Current Outpatient Medications:    Blood Glucose Monitoring Suppl (ONE TOUCH ULTRA SYSTEM KIT) w/Device KIT, Use as directed twice daily to check blood sugar.  DX E11.9, Disp: 1 each, Rfl: 0   carvedilol (COREG) 12.5 MG tablet, Take 12.5 mg by mouth daily., Disp: , Rfl:    fluticasone  (FLONASE ) 50 MCG/ACT nasal spray, Place 2 sprays into both nostrils daily., Disp: , Rfl:    furosemide  (LASIX ) 20 MG tablet, Take 20 mg by mouth daily. (Patient taking differently: Take 20 mg by mouth once a week.), Disp: , Rfl:    LORazepam (ATIVAN) 0.5 MG tablet, TAKE 1 TABLET AT NIGHTTIME AS NEEDED  FOR SLEEP, Disp: , Rfl:    meclizine (ANTIVERT) 12.5 MG tablet, Take 12.5 mg by mouth 3 (three) times daily as needed for dizziness., Disp: , Rfl:    ONE TOUCH ULTRA TEST test strip, USE AS DIRECTED TWICE A DAY, Disp: 100 each, Rfl: 2   Vitamin D , Ergocalciferol , (DRISDOL ) 1.25 MG (50000 UT) CAPS capsule, TAKE 1 CAPSULE BY MOUTH ONCE A WEEK, Disp: 12 capsule, Rfl: 0   MOUNJARO 5 MG/0.5ML Pen, Inject 5 mg into the skin once a week. (Patient not taking: Reported on 10/08/2023), Disp: , Rfl:    nitroGLYCERIN  (NITROSTAT ) 0.4 MG SL tablet, Place 1 tablet (0.4 mg total) under the tongue every 5 (five) minutes as needed for chest pain. (Patient not taking: Reported on 10/08/2023), Disp: 25 tablet, Rfl: 3   rosuvastatin  (CRESTOR ) 10 MG tablet, Take 1 tablet (10 mg total) daily by mouth. (Patient not taking: Reported on 09/23/2023), Disp: 30 tablet, Rfl: 6  Allergies:  Allergies  Allergen Reactions   Losartan  Palpitations   Lexapro  [Escitalopram ] Other (See Comments)    swelling   Sulfa Antibiotics Hives and Rash    Burning rash    Past Medical History, Surgical history, Social history, and Family History were reviewed and updated.  Review of Systems: Review of  Systems  Constitutional: Negative.   HENT:  Negative.    Eyes: Negative.   Respiratory: Negative.    Cardiovascular: Negative.   Gastrointestinal: Negative.   Endocrine: Negative.   Genitourinary: Negative.    Musculoskeletal: Negative.   Skin: Negative.   Neurological: Negative.   Hematological: Negative.   Psychiatric/Behavioral: Negative.      Physical Exam:  height is 5' 1 (1.549 m) and weight is 192 lb (87.1 kg). Her oral temperature is 98.7 F (37.1 C). Her blood pressure is 120/54 (abnormal) and her pulse is 90. Her respiration is 20 and oxygen saturation is 99%.   Wt Readings from Last 3 Encounters:  10/08/23 192 lb (87.1 kg)  08/13/23 193 lb (87.5 kg)  04/02/23 194 lb (88 kg)    Physical Exam Vitals reviewed.   HENT:     Head: Normocephalic and atraumatic.  Eyes:     Pupils: Pupils are equal, round, and reactive to light.  Cardiovascular:     Rate and Rhythm: Normal rate and regular rhythm.     Heart sounds: Normal heart sounds.  Pulmonary:     Effort: Pulmonary effort is normal.     Breath sounds: Normal breath sounds.  Abdominal:     General: Bowel sounds are normal.     Palpations: Abdomen is soft.  Musculoskeletal:        General: No tenderness or deformity. Normal range of motion.     Cervical back: Normal range of motion.  Lymphadenopathy:     Cervical: No cervical adenopathy.  Skin:    General: Skin is warm and dry.     Findings: No erythema or rash.  Neurological:     Mental Status: She is alert and oriented to person, place, and time.  Psychiatric:        Behavior: Behavior normal.        Thought Content: Thought content normal.        Judgment: Judgment normal.      Lab Results  Component Value Date   WBC 6.2 10/08/2023   HGB 12.1 10/08/2023   HCT 35.7 (L) 10/08/2023   MCV 89.9 10/08/2023   PLT 99 (L) 10/08/2023     Chemistry      Component Value Date/Time   NA 137 10/08/2023 1149   NA 141 12/25/2016 1407   K 4.6 10/08/2023 1149   CL 101 10/08/2023 1149   CO2 26 10/08/2023 1149   BUN 12 10/08/2023 1149   BUN 12 12/25/2016 1407   CREATININE 0.78 10/08/2023 1149   CREATININE 0.70 12/03/2012 0917      Component Value Date/Time   CALCIUM  9.0 10/08/2023 1149   ALKPHOS 119 10/08/2023 1149   AST 33 10/08/2023 1149   ALT 21 10/08/2023 1149   BILITOT 0.9 10/08/2023 1149      Impression and Plan: Stephanie Cordova is a very nice 25 year old white female.  She has NASH.  She has some cirrhosis.  Again we are not sure if there is a mass in the liver.  This just may be an area of fatty infiltration.  Will see about getting a PET scan on her.  I think this would be reasonable.  Again an MRI cannot be done because of her cochlear implant.  Her platelet count certainly  is better.  I would think that if she needs to have a biopsy, her platelet count should be okay.  She looks quite good to me.  Again I just would be surprised if there  is any malignancy.  However, we will certainly have to check.  Given the fact that she has this cochlear implant really does make it this more challenging.  I would like to have her come back in a month or so.   [name], MD 9/9/20251:01 PM

## 2023-10-09 ENCOUNTER — Ambulatory Visit (INDEPENDENT_AMBULATORY_CARE_PROVIDER_SITE_OTHER): Admitting: Otolaryngology

## 2023-10-09 ENCOUNTER — Encounter (INDEPENDENT_AMBULATORY_CARE_PROVIDER_SITE_OTHER): Payer: Self-pay | Admitting: Otolaryngology

## 2023-10-09 VITALS — BP 133/84 | HR 91

## 2023-10-09 DIAGNOSIS — H9042 Sensorineural hearing loss, unilateral, left ear, with unrestricted hearing on the contralateral side: Secondary | ICD-10-CM

## 2023-10-09 DIAGNOSIS — Z9629 Presence of other otological and audiological implants: Secondary | ICD-10-CM

## 2023-10-09 DIAGNOSIS — H7202 Central perforation of tympanic membrane, left ear: Secondary | ICD-10-CM

## 2023-10-09 DIAGNOSIS — H9222 Otorrhagia, left ear: Secondary | ICD-10-CM

## 2023-10-09 DIAGNOSIS — H6242 Otitis externa in other diseases classified elsewhere, left ear: Secondary | ICD-10-CM | POA: Diagnosis not present

## 2023-10-09 DIAGNOSIS — H60502 Unspecified acute noninfective otitis externa, left ear: Secondary | ICD-10-CM | POA: Diagnosis not present

## 2023-10-09 DIAGNOSIS — Z09 Encounter for follow-up examination after completed treatment for conditions other than malignant neoplasm: Secondary | ICD-10-CM

## 2023-10-09 DIAGNOSIS — H903 Sensorineural hearing loss, bilateral: Secondary | ICD-10-CM

## 2023-10-09 DIAGNOSIS — H60332 Swimmer's ear, left ear: Secondary | ICD-10-CM

## 2023-10-09 DIAGNOSIS — Z8669 Personal history of other diseases of the nervous system and sense organs: Secondary | ICD-10-CM | POA: Diagnosis not present

## 2023-10-10 ENCOUNTER — Ambulatory Visit: Admitting: Physical Therapy

## 2023-10-11 DIAGNOSIS — H60332 Swimmer's ear, left ear: Secondary | ICD-10-CM | POA: Insufficient documentation

## 2023-10-11 MED ORDER — CIPROFLOXACIN-DEXAMETHASONE 0.3-0.1 % OT SUSP: 4.0000 [drp] | Freq: Two times a day (BID) | OTIC | 8 refills | Status: AC

## 2023-10-11 NOTE — Progress Notes (Signed)
 Patient ID: Stephanie Cordova, female   DOB: 1955/05/31, 68 y.o.   MRN: [id number]  New complaint: Left ear pain and left ear drainage Follow-up: Hearing loss  HPI: The patient is a 68 year old female who presents today with a new complaint of left ear pain and left ear drainage. She was previously seen for an acute decrease in her left ear hearing. She has a history of asymmetric right ear hearing loss.  Her MRI scan was negative.  She was treated with prednisone Dosepak and dexamethasone  infusion without significant improvement in her left ear hearing.  She subsequently underwent cochlear implantation on the right side in December 2024.  The surgery was performed at [facility].  The patient returns today complaining of left ear drainage and left ear pain for the past week.  She is not on any otologic medication at this time.  Her hearing has improved with the use of right ear cochlear implant.  Exam: General: Communicates without difficulty, well nourished, no acute distress. Head: Normocephalic, no evidence injury, no tenderness, facial buttresses intact without stepoff. Face/sinus: No tenderness to palpation and percussion. Facial movement is normal and symmetric. Eyes: PERRL, EOMI. No scleral icterus, conjunctivae clear. Neuro: CN II exam reveals vision grossly intact.  No nystagmus at any point of gaze. Ears: Auricles well formed without lesions.  The right ear canal and tympanic membrane are normal.  Bloody drainage is noted within the left ear canal.  Polypoid tissue is noted to cover the left tympanic membrane.  The left tube has extruded.  Under the operating microscope, the left ear canal is debrided with a suction catheter.  The extruded tube is removed.  Nose: External evaluation reveals normal support and skin without lesions.  Dorsum is intact.  Anterior rhinoscopy reveals pink mucosa over anterior aspect of inferior turbinates and intact septum.  No purulence noted. Oral:  Oral cavity and  oropharynx are intact, symmetric, without erythema or edema.  Mucosa is moist without lesions. Neck: Full range of motion without pain.  There is no significant lymphadenopathy.  No masses palpable.  Thyroid  bed within normal limits to palpation.  Parotid glands and submandibular glands equal bilaterally without mass.  Trachea is midline. Neuro:  CN 2-12 grossly intact. Gait normal.   Assessment: 1.  Left acute otitis externa with polypoid tissue and bloody otorrhea. 2.  The left tube has extruded into the ear canal.  It is removed without difficulty. 3.  Right ear profound hearing loss and left ear severe sensorineural hearing loss.  Plan: 1.  The physical exam findings are reviewed with the patient. 2.  Otomicroscopy with debridement of the left ear canal and removal of the extruded tube. 3.  Ciprodex  eardrops 4 drops left ear twice daily for 1 week. 4.  Dry ear precautions on the left side. 5.  The patient will return for reevaluation in 2 weeks.

## 2023-10-11 NOTE — Therapy (Signed)
 OUTPATIENT PHYSICAL THERAPY VESTIBULAR TREATMENT     Patient Name: Stephanie Cordova MRN: [id number] DOB:06-14-1955, 68 y.o., female Today's Date: 10/14/2023  END OF SESSION:  PT End of Session - 10/14/23 1057     Visit Number 5    Number of Visits 10    Date for PT Re-Evaluation 10/25/23    Authorization Type BCBS Medicare    Progress Note Due on Visit 10    PT Start Time 1017    PT Stop Time 1058    PT Time Calculation (min) 41 min    Equipment Utilized During Treatment Gait belt    Activity Tolerance Patient tolerated treatment well    Behavior During Therapy WFL for tasks assessed/performed             Past Medical History:  Diagnosis Date   Abnormal liver function tests 08/07/2016   Arthritis    b/l knees, bone on bone   Cirrhosis of liver (HCC) 12/21/2012   Confirmed via ultrasound per patient, she reports neg acute hepatitis panel    Cold sore 11/04/2016   Diabetes mellitus type 2 in obese 12/21/2012   takes Amaryl  daily   History of bronchitis 03/2015   History of colon polyps    benign   Hypertension    takes Metoprolol  and Lisinopril  daily   Insomnia    Pneumonia    hx of > 5 yrs ago   Pulmonary nodule 08/07/2016   Sinusitis, acute 01/26/2016   Vaginitis 10/30/2016   Vitamin D  deficiency 04/24/2016   Past Surgical History:  Procedure Laterality Date   ABDOMINAL HYSTERECTOMY  1996   BREAST BIOPSY     CHOLECYSTECTOMY N/A 09/06/2015   Procedure: LAPAROSCOPIC CHOLECYSTECTOMY;  Surgeon: Lynda Leos, MD;  Location: [facility] OR;  Service: General;  Laterality: N/A;   COLONOSCOPY WITH ESOPHAGOGASTRODUODENOSCOPY (EGD)     KNEE ARTHROSCOPY Bilateral    LEFT HEART CATH AND CORONARY ANGIOGRAPHY N/A 12/27/2016   Procedure: LEFT HEART CATH AND CORONARY ANGIOGRAPHY;  Surgeon: Wonda Sharper, MD;  Location: [facility] INVASIVE CV LAB;  Service: Cardiovascular;  Laterality: N/A;   TONSILLECTOMY     TUBAL LIGATION     Patient Active Problem List   Diagnosis Date Noted   Left  swimmer's ear 10/11/2023   Sensory hearing loss, bilateral 04/03/2023   Central perforation of tympanic membrane of left ear 04/03/2023   Hordeolum externum of right lower eyelid 08/04/2017   Shortness of breath 12/27/2016   Exertional chest pain    Angina pectoris (HCC) 12/04/2016   Obstructive hypertrophic cardiomyopathy (HCC) 12/04/2016   Atypical chest pain 11/25/2016   Cold sore 11/04/2016   Vaginitis 10/30/2016   Abnormal liver function tests 08/07/2016   Pulmonary nodule 08/07/2016   Vitamin D  deficiency 04/24/2016   Hyperlipidemia 04/24/2016   Trigger finger, acquired 11/16/2015   Left knee pain 11/16/2015   RUQ pain 08/09/2015   Skin lesion of right lower extremity 08/09/2015   Snoring 06/29/2014   Cephalalgia 06/19/2014   Restless sleeper 06/19/2014   Type 2 diabetes mellitus with obesity (HCC) 12/21/2012   Cirrhosis of liver (HCC) 12/21/2012   Preventative health care 11/30/2012   History of colonic polyps 11/30/2012   HTN (hypertension) 11/30/2012   Insomnia    Obesity    Hot flash, menopausal    Arthritis    Allergy     PCP: Leila Lucie LABOR, MD REFERRING PROVIDER: Anita Bernardino BROCKS, FNP   REFERRING DIAG: R42 (ICD-10-CM) - Dizziness and giddiness   THERAPY  DIAG:  Dizziness and giddiness  Unsteadiness on feet  ONSET DATE: 2 months ago  Rationale for Evaluation and Treatment: Rehabilitation  SUBJECTIVE:   SUBJECTIVE STATEMENT: It's going good and the dizziness is so much better. Was able to sweep and mop and bending motions did not bother her. Reports that she was put on antibiotics for drainage in her L ear. Reports that she has been walking with a cane outside but it can be hard to coordinate.   Pt accompanied by: self  PERTINENT HISTORY: arthritis bilat knees, HTN, DM, thrombocytopenia, cochlear implant 9 months ago  PAIN:  Are you having pain? No  PRECAUTIONS: Fall and Other: R cochlear implant  RED FLAGS: None   WEIGHT BEARING  RESTRICTIONS: No  FALLS: Has patient fallen in last 6 months? Yes. Number of falls 1  LIVING ENVIRONMENT: Lives with: lives alone Lives in: House/apartment Stairs: 3 steps into home, single level home Has following equipment at home: None  PLOF: Independent  PATIENT GOALS: To not be dizzy when I bend over.  OBJECTIVE:     TODAY'S TREATMENT: 10/14/23 Activity Comments  Gait training outside with [facility] on sidewalk, curb, ramp 583ft SBA-CGA Edu on safe sequencing with curbs; some instability on unlevel parts of sidewalk with good ability to recovery balance; cues to keep eyes ~31ft ahead throughout   bending to set down cones, then forward or side step over them  C/o mild dizziness upon coming up   Standing forward T to cone on floor at counter  Had to elevate cone on 6 step to assist in reaching target. Instructed on visual fixation to targets with each position. Pt with mild-mod instability   Sitting nose to knee habituation 3x10 each side; additional 1x10 with EC Cueing for quicker pace on the way up for intended sx onset. Pt reported less sx with EC   gait + head turns/nods CGA and moderate instability; c/o some lightheadedness   Discussed POC and remaining visit        HOME EXERCISE PROGRAM:  Access Code: [email or web address] URL: [email or web address] Date: 10/07/2023 Prepared by: [facility] - Outpatient  Rehab - [facility]  Exercises - Seated Gaze Stabilization with Head Rotation  - 2-3 x daily - 7 x weekly - 3 sets - 30 sec hold - Seated Gaze Stabilization with Head Nod  - 2-3 x daily - 7 x weekly - 1 sets - 30 sec hold - Brandt-Daroff Vestibular Exercise  - 1 x daily - 7 x weekly - 5 reps - Corner Balance Feet Together With Eyes Open  - 1 x daily - 7 x weekly - 3 sets - 30 sec hold - Corner Balance Feet Together With Eyes Closed  - 1 x daily - 7 x weekly - 3 sets - 30 sec hold - Corner Balance Feet Apart: Eyes Open With Head Turns  - 1 x daily - 7 x weekly - 3  sets - 30 sec hold - Corner Balance Feet Together: Eyes Closed With Head Turns  - 1 x daily - 7 x weekly - 3 sets - 30 sec hold - Seated Nose to Left Knee Vestibular Habituation  - 1 x daily - 5 x weekly - 2 sets - 3-5 reps - 10 sec  hold - Seated Nose to Right Knee Vestibular Habituation  - 1 x daily - 5 x weekly - 2 sets - 3-5 reps - 10 sec  hold - Side Stepping with Resistance at Ankles and Counter Support  -  1 x daily - 5 x weekly - 1 sets - 3-5 reps     Note: Objective measures were completed at Evaluation unless otherwise noted.  DIAGNOSTIC FINDINGS: CT of head is normal  COGNITION: Overall cognitive status: Within functional limits for tasks assessed   POSTURE:  rounded shoulders and forward head  Cervical ROM:    Active A/PROM (deg) eval  Flexion 40  Extension 30  Right lateral flexion   Left lateral flexion   Right rotation 45  Left rotation 45  (Blank rows = not tested)   BED MOBILITY:  Independent, slow and guarded  TRANSFERS: Assistive device utilized: None  Sit to stand: Modified independence Stand to sit: Modified independence  GAIT: Gait pattern: step through pattern and trendelenburg Distance walked: 50 ft Assistive device utilized: None Level of assistance: Modified independence   PATIENT SURVEYS:  DHI: THE DIZZINESS HANDICAP INVENTORY (DHI)  P1. Does looking up increase your problem? 0 = No  E2. Because of your problem, do you feel frustrated? 4 = Yes  F3. Because of your problem, do you restrict your travel for business or recreation?  4 = Yes  P4. Does walking down the aisle of a supermarket increase your problems?  4 = Yes  F5. Because of your problem, do you have difficulty getting into or out of bed?  0 = No  F6. Does your problem significantly restrict your participation in social activities, such as going out to dinner, going to the movies, dancing, or going to parties? 2 = Sometimes  F7. Because of your problem, do you have difficulty  reading?  0 = No  P8. Does performing more ambitious activities such as sports, dancing, household chores (sweeping or putting dishes away) increase your problems?  2 = Sometimes  E9. Because of your problem, are you afraid to leave your home without having without having someone accompany you?  0 = No  E10. Because of your problem have you been embarrassed in front of others?  2 = Sometimes  P11. Do quick movements of your head increase your problem?  4 = Yes  F12. Because of your problem, do you avoid heights?  0 = No  P13. Does turning over in bed increase your problem?  0 = No  F14. Because of your problem, is it difficult for you to do strenuous homework or yard work? 2 = Sometimes  E15. Because of your problem, are you afraid people may think you are intoxicated? 4 = Yes  F16. Because of your problem, is it difficult for you to go for a walk by yourself?  2 = Sometimes  P17. Does walking down a sidewalk increase your problem?  2 = Sometimes  E18.Because of your problem, is it difficult for you to concentrate 0 = No  F19. Because of your problem, is it difficult for you to walk around your house in the dark? 4 = Yes  E20. Because of your problem, are you afraid to stay home alone?  0 = No  E21. Because of your problem, do you feel handicapped? 2 = Sometimes  E22. Has the problem placed stress on your relationships with members of your family or friends? 0 = No  E23. Because of your problem, are you depressed?  2 = Sometimes  F24. Does your problem interfere with your job or household responsibilities?  2 = Sometimes  P25. Does bending over increase your problem?  4 = Yes  TOTAL 46    DHI  Scoring Instructions  The patient is asked to answer each question as it pertains to dizziness or unsteadiness problems, specifically  considering their condition during the last month. Questions are designed to incorporate functional (F), physical  (P), and emotional (E) impacts on disability.    Scores greater than 10 points should be referred to balance specialists for further evaluation.   16-34 Points (mild handicap)  36-52 Points (moderate handicap)  54+ Points (severe handicap)  Minimally Detectable Change: 17 points ([address] [place], 1990)  Hayward, G. SHAUNNA. and Bantam, C. W. (1990). The development of the Dizziness Handicap Inventory. Archives of Otolaryngology - Head and Neck Surgery 116(4): [id number].   VESTIBULAR ASSESSMENT:  GENERAL OBSERVATION: Pt moves slowly, guarded, especially with bed mobility   SYMPTOM BEHAVIOR:  Subjective history: 2 months ago, pt reports she woke up to dizziness.  She has only experienced this previously just after her cochlear implant was placed (about 9 months ago).  She reports sensation as spinning, lightheaded, imbalanced; her symptoms are mostly lightheadedness and imbalance now, that comes on with head motions L and R and bending over/looking up.  She does have hx of cochlear implant on R (approx 9 months ago), fall and hitting her head (CT negative), antibiotics given approx 2-2.5 months ago due to fluid in her inner ear (pt states dizziness preceded this).   Non-Vestibular symptoms: NA  Type of dizziness: Spinning/Vertigo, Unsteady with head/body turns, Lightheadedness/Faint, and quick movements lag  Frequency: daily  Duration: lasts until I sit back down  Aggravating factors: Induced by position change: supine to sit, Induced by motion: looking up at the ceiling, bending down to the ground, turning body quickly, and turning head quickly, and worse in the afternoon  Relieving factors: rest and sitting down  Progression of symptoms: unchanged  OCULOMOTOR EXAM:  Ocular Alignment: normal  Ocular ROM: No Limitations  Spontaneous Nystagmus: absent  Gaze-Induced Nystagmus: absent  Smooth Pursuits: intact  Saccades: intact and except some quick, overshoot movements R eye  Convergence/Divergence: 2-3 cm    VESTIBULAR - OCULAR  REFLEX:   Slow VOR: Comment: slowed horizontal, rates as 8/10 dizziness; no symptoms vertical  VOR Cancellation: Corrective Saccades to R side, 5/10 dizziness  Head-Impulse Test: HIT Right: positive HIT Left: positive  Dynamic Visual Acuity: NT   POSITIONAL TESTING: Right Dix-Hallpike: no nystagmus and reports dizziness getting into position Left Dix-Hallpike: no nystagmus and reports dizziness coming into position (pt comes into position very slowly, did not see nystagmus) Right Roll Test: no nystagmus and mild dizziness getting into position Left Roll Test: no nystagmus and mild dizziness getting into position    M-CTSIB  Condition 1: Firm Surface, EO 30 Sec, Normal Sway  Condition 2: Firm Surface, EC 30 Sec, Mild Sway  Condition 3: Foam Surface, EO 30 Sec, Mild Sway  Condition 4: Foam Surface, EC 8.29 Sec, Severe Sway    MOTION SENSITIVITY:  Motion Sensitivity Quotient Intensity: 0 = none, 1 = Lightheaded, 2 = Mild, 3 = Moderate, 4 = Severe, 5 = Vomiting  Intensity  1. Sitting to supine   2. Supine to L side   3. Supine to R side   4. Supine to sitting   5. L Hallpike-Dix   6. Up from L    7. R Hallpike-Dix   8. Up from R    9. Sitting, head tipped to L knee   10. Head up from L knee   11. Sitting, head tipped to R knee  12. Head up from R knee   13. Sitting head turns x5   14.Sitting head nods x5   15. In stance, 180 turn to L    16. In stance, 180 turn to R                                                                                                                                 TREATMENT DATE: 09/23/2023      GOALS: Goals reviewed with patient? Yes  SHORT TERM GOALS: Target date: 10/04/2023  Pt will be independent with HEP for improved dizziness, balance. Baseline: pt reports compliance and benefit from HEP 10/03/23 Goal status: MET 10/03/23  LONG TERM GOALS: Target date: 10/25/2023  Pt will be independent with HEP for improved dizziness,  balance. Baseline:  Goal status: IN PROGRESS  2.  DHI score to improve to less than or equal to 28, to demo decreased dizziness impacting daily activities. Baseline: 46 Goal status: IN PROGRESS  3.  Pt will improve Condition 4 on MCTSIB to 30 seconds mod sway or better, to demo improved balance. Baseline: 8 sec Goal status: IN PROGRESS  4.  Pt will improve FGA score to at least 23/30 to decrease fall risk. Baseline: TBD Goal status: IN PROGRESS  5.  Pt will verbalize understanding of fall prevention in home environment.  Baseline:  Goal status: IN PROGRESS    ASSESSMENT:  CLINICAL IMPRESSION: Patient arrived to session with report of improved dizziness. Reports being put on antibiotics for drainage in her L ear. Reviewed gait training with SPC on outside surfaces today and patient did a good job of self-correcting sequencing when needed. Bending habituation tasks still eliciting mild dizziness today. Reviewed HEP habituation with instruction on quicker pace for max challenge. Patient tolerated  session well and without complaints upon leaving.    OBJECTIVE IMPAIRMENTS: Abnormal gait, decreased balance, difficulty walking, and dizziness.   ACTIVITY LIMITATIONS: bending, squatting, bed mobility, reach over head, and locomotion level  PARTICIPATION LIMITATIONS: meal prep, cleaning, laundry, community activity, school, and walking the dog  PERSONAL FACTORS: 3+ comorbidities: see above are also affecting patient's functional outcome.   REHAB POTENTIAL: Good  CLINICAL DECISION MAKING: Stable/uncomplicated  EVALUATION COMPLEXITY: Low   PLAN:  PT FREQUENCY: 2x/week  PT DURATION: other: 5 weeks, including eval week  PLANNED INTERVENTIONS: 97750- Physical Performance Testing, 97110-Therapeutic exercises, 97530- Therapeutic activity, [id number]- Neuromuscular re-education, 97535- Self Care, [id number]- Manual therapy, [phone]- Gait training, Patient/Family education, Balance training, and  Vestibular training  PLAN FOR NEXT SESSION: recert vs. DC; Work on 180 turns/stop (harder to R today).  HEP review and progressions for VOR.   May need to retest positional vertigo as symptoms warrant    Louana Terrilyn Christians, PT, DPT 10/14/23 10:59 AM  [facility] [address] [place], [address] [place], [place] [address] Phone # [phone] Fax # [phone]

## 2023-10-14 ENCOUNTER — Ambulatory Visit: Admitting: Physical Therapy

## 2023-10-14 ENCOUNTER — Encounter: Payer: Self-pay | Admitting: Physical Therapy

## 2023-10-14 DIAGNOSIS — R2681 Unsteadiness on feet: Secondary | ICD-10-CM

## 2023-10-14 DIAGNOSIS — R42 Dizziness and giddiness: Secondary | ICD-10-CM

## 2023-10-17 ENCOUNTER — Encounter: Payer: Self-pay | Admitting: Physical Therapy

## 2023-10-17 ENCOUNTER — Ambulatory Visit: Admitting: Physical Therapy

## 2023-10-17 DIAGNOSIS — R42 Dizziness and giddiness: Secondary | ICD-10-CM | POA: Diagnosis not present

## 2023-10-17 DIAGNOSIS — R2681 Unsteadiness on feet: Secondary | ICD-10-CM

## 2023-10-17 NOTE — Therapy (Signed)
 OUTPATIENT PHYSICAL THERAPY VESTIBULAR TREATMENT AND DISCHARGE  PHYSICAL THERAPY DISCHARGE SUMMARY  Visits from Start of Care: 6  Current functional level related to goals / functional outcomes: See below   Remaining deficits: See below   Education / Equipment: See below   Patient agrees to discharge. Patient goals were met. Patient is being discharged due to meeting the stated rehab goals.    Patient Name: Stephanie Cordova MRN: [id number] DOB:Nov 23, 1955, 68 y.o., female Today's Date: 10/17/2023  END OF SESSION:  PT End of Session - 10/17/23 1013     Visit Number 6    Number of Visits 10    Date for Recertification  10/25/23    Authorization Type BCBS Medicare    Progress Note Due on Visit 10    PT Start Time 1015    PT Stop Time 1055    PT Time Calculation (min) 40 min    Equipment Utilized During Treatment Gait belt    Activity Tolerance Patient tolerated treatment well    Behavior During Therapy WFL for tasks assessed/performed              Past Medical History:  Diagnosis Date   Abnormal liver function tests 08/07/2016   Arthritis    b/l knees, bone on bone   Cirrhosis of liver (HCC) 12/21/2012   Confirmed via ultrasound per patient, she reports neg acute hepatitis panel    Cold sore 11/04/2016   Diabetes mellitus type 2 in obese 12/21/2012   takes Amaryl  daily   History of bronchitis 03/2015   History of colon polyps    benign   Hypertension    takes Metoprolol  and Lisinopril  daily   Insomnia    Pneumonia    hx of > 5 yrs ago   Pulmonary nodule 08/07/2016   Sinusitis, acute 01/26/2016   Vaginitis 10/30/2016   Vitamin D  deficiency 04/24/2016   Past Surgical History:  Procedure Laterality Date   ABDOMINAL HYSTERECTOMY  1996   BREAST BIOPSY     CHOLECYSTECTOMY N/A 09/06/2015   Procedure: LAPAROSCOPIC CHOLECYSTECTOMY;  Surgeon: Lynda Leos, MD;  Location: [facility] OR;  Service: General;  Laterality: N/A;   COLONOSCOPY WITH ESOPHAGOGASTRODUODENOSCOPY (EGD)      KNEE ARTHROSCOPY Bilateral    LEFT HEART CATH AND CORONARY ANGIOGRAPHY N/A 12/27/2016   Procedure: LEFT HEART CATH AND CORONARY ANGIOGRAPHY;  Surgeon: Wonda Sharper, MD;  Location: [facility] INVASIVE CV LAB;  Service: Cardiovascular;  Laterality: N/A;   TONSILLECTOMY     TUBAL LIGATION     Patient Active Problem List   Diagnosis Date Noted   Left swimmer's ear 10/11/2023   Sensory hearing loss, bilateral 04/03/2023   Central perforation of tympanic membrane of left ear 04/03/2023   Hordeolum externum of right lower eyelid 08/04/2017   Shortness of breath 12/27/2016   Exertional chest pain    Angina pectoris (HCC) 12/04/2016   Obstructive hypertrophic cardiomyopathy (HCC) 12/04/2016   Atypical chest pain 11/25/2016   Cold sore 11/04/2016   Vaginitis 10/30/2016   Abnormal liver function tests 08/07/2016   Pulmonary nodule 08/07/2016   Vitamin D  deficiency 04/24/2016   Hyperlipidemia 04/24/2016   Trigger finger, acquired 11/16/2015   Left knee pain 11/16/2015   RUQ pain 08/09/2015   Skin lesion of right lower extremity 08/09/2015   Snoring 06/29/2014   Cephalalgia 06/19/2014   Restless sleeper 06/19/2014   Type 2 diabetes mellitus with obesity (HCC) 12/21/2012   Cirrhosis of liver (HCC) 12/21/2012   Preventative health care 11/30/2012  History of colonic polyps 11/30/2012   HTN (hypertension) 11/30/2012   Insomnia    Obesity    Hot flash, menopausal    Arthritis    Allergy     PCP: Leila Lucie LABOR, MD REFERRING PROVIDER: Anita Bernardino BROCKS, FNP   REFERRING DIAG: R42 (ICD-10-CM) - Dizziness and giddiness   THERAPY DIAG:  Dizziness and giddiness  Unsteadiness on feet  ONSET DATE: 2 months ago  Rationale for Evaluation and Treatment: Rehabilitation  SUBJECTIVE:   SUBJECTIVE STATEMENT: Pt states it's been better. She can do yard work without noticing it. Feels back to normal.   Pt accompanied by: self  PERTINENT HISTORY: arthritis bilat knees, HTN, DM,  thrombocytopenia, cochlear implant 9 months ago  PAIN:  Are you having pain? No  PRECAUTIONS: Fall and Other: R cochlear implant  RED FLAGS: None   WEIGHT BEARING RESTRICTIONS: No  FALLS: Has patient fallen in last 6 months? Yes. Number of falls 1  LIVING ENVIRONMENT: Lives with: lives alone Lives in: House/apartment Stairs: 3 steps into home, single level home Has following equipment at home: None  PLOF: Independent  PATIENT GOALS: To not be dizzy when I bend over.  OBJECTIVE:   TODAY'S TREATMENT: 10/17/23 Activity Comments  Rechecked goals -- see below   Updated HEP: Standing feet together VOR x1 head turns and head nods x 30 each Vestibular ball x5 CW & CCW Backward monster walk red TB x 5 laps in // bars Side stepping red TB x 5 laps in // bars                 TODAY'S TREATMENT: 10/14/23 Activity Comments  Gait training outside with [facility] on sidewalk, curb, ramp 584ft SBA-CGA Edu on safe sequencing with curbs; some instability on unlevel parts of sidewalk with good ability to recovery balance; cues to keep eyes ~102ft ahead throughout   bending to set down cones, then forward or side step over them  C/o mild dizziness upon coming up   Standing forward T to cone on floor at counter  Had to elevate cone on 6 step to assist in reaching target. Instructed on visual fixation to targets with each position. Pt with mild-mod instability   Sitting nose to knee habituation 3x10 each side; additional 1x10 with EC Cueing for quicker pace on the way up for intended sx onset. Pt reported less sx with EC   gait + head turns/nods CGA and moderate instability; c/o some lightheadedness   Discussed POC and remaining visit        HOME EXERCISE PROGRAM: Access Code: [email or web address] URL: [email or web address] Date: 10/17/2023 Prepared by: Diem Dicocco April Earnie Starring  Exercises - Gaze Stability (VOR) x1 Feet Together on Firm Ground With Horizontal Head Turns  - 2-3 x  daily - 7 x weekly - 3 sets - 30 sec hold - Gaze Stability (VOR) x1 Feet Together on Firm Ground With Vertical Head Turns  - 2-3 x daily - 7 x weekly - 3 sets - 30 sec hold - Brandt-Daroff Vestibular Exercise  - 1 x daily - 7 x weekly - 5 reps - Romberg Stance Eyes Closed on Foam Pad  - 1 x daily - 7 x weekly - 2 sets - 30 sec hold - Corner Balance Feet Together: Eyes Closed With Head Turns  - 1 x daily - 7 x weekly - 3 sets - 30 sec hold - Seated Nose to Left Knee Vestibular Habituation  - 1 x daily - 5 x weekly -  2 sets - 3-5 reps - 10 sec  hold - Seated Nose to Right Knee Vestibular Habituation  - 1 x daily - 5 x weekly - 2 sets - 3-5 reps - 10 sec  hold - Standing Vestibular Ball Circles  - 1 x daily - 5 x weekly - 2 sets - 5 reps - Side Stepping with Resistance at Ankles and Counter Support  - 1 x daily - 5 x weekly - 1 sets - 3-5 reps - Backward Monster Walk with Resistance at Ankles and Counter Support  - 1 x daily - 5 x weekly - 1 sets - 3-5 reps     Note: Objective measures were completed at Evaluation unless otherwise noted.  DIAGNOSTIC FINDINGS: CT of head is normal  COGNITION: Overall cognitive status: Within functional limits for tasks assessed   POSTURE:  rounded shoulders and forward head  Cervical ROM:    Active A/PROM (deg) eval  Flexion 40  Extension 30  Right lateral flexion   Left lateral flexion   Right rotation 45  Left rotation 45  (Blank rows = not tested)   BED MOBILITY:  Independent, slow and guarded  TRANSFERS: Assistive device utilized: None  Sit to stand: Modified independence Stand to sit: Modified independence  GAIT: Gait pattern: step through pattern and trendelenburg Distance walked: 50 ft Assistive device utilized: None Level of assistance: Modified independence   PATIENT SURVEYS:  DHI: THE DIZZINESS HANDICAP INVENTORY (DHI)  Question Eval 10/17/23  P1. Does looking up increase your problem? 0 = No 0 = No  E2. Because of your  problem, do you feel frustrated? 4 = Yes 0 = No  F3. Because of your problem, do you restrict your travel for business or recreation?  4 = Yes 0 = No  P4. Does walking down the aisle of a supermarket increase your problems?  4 = Yes 0 = No  F5. Because of your problem, do you have difficulty getting into or out of bed?  0 = No 0 = No  F6. Does your problem significantly restrict your participation in social activities, such as going out to dinner, going to the movies, dancing, or going to parties? 2 = Sometimes 0 = No  F7. Because of your problem, do you have difficulty reading?  0 = No 0 = No  P8. Does performing more ambitious activities such as sports, dancing, household chores (sweeping or putting dishes away) increase your problems?  2 = Sometimes 0 = No  E9. Because of your problem, are you afraid to leave your home without having without having someone accompany you?  0 = No 0 = No  E10. Because of your problem have you been embarrassed in front of others?  2 = Sometimes 0 = No  P11. Do quick movements of your head increase your problem?  4 = Yes 2 = Sometimes  F12. Because of your problem, do you avoid heights?  0 = No 2 = Sometimes  P13. Does turning over in bed increase your problem?  0 = No 0 = No  F14. Because of your problem, is it difficult for you to do strenuous homework or yard work? 2 = Sometimes 0 = No  E15. Because of your problem, are you afraid people may think you are intoxicated? 4 = Yes 0 = No  F16. Because of your problem, is it difficult for you to go for a walk by yourself?  2 = Sometimes 0 = No  P17. Does walking down a sidewalk increase your problem?  2 = Sometimes 0 = No  E18.Because of your problem, is it difficult for you to concentrate 0 = No 0 = No  F19. Because of your problem, is it difficult for you to walk around your house in the dark? 4 = Yes 2 = Sometimes  E20. Because of your problem, are you afraid to stay home alone?  0 = No 0 = No  E21. Because of your  problem, do you feel handicapped? 2 = Sometimes 0 = No  E22. Has the problem placed stress on your relationships with members of your family or friends? 0 = No 0 = No  E23. Because of your problem, are you depressed?  2 = Sometimes 0 = No  F24. Does your problem interfere with your job or household responsibilities?  2 = Sometimes 0 = No  P25. Does bending over increase your problem?  4 = Yes 2 = Sometimes  TOTAL 46 8    DHI Scoring Instructions  The patient is asked to answer each question as it pertains to dizziness or unsteadiness problems, specifically  considering their condition during the last month. Questions are designed to incorporate functional (F), physical  (P), and emotional (E) impacts on disability.   Scores greater than 10 points should be referred to balance specialists for further evaluation.   16-34 Points (mild handicap)  36-52 Points (moderate handicap)  54+ Points (severe handicap)  Minimally Detectable Change: 17 points ([address] [place], 1990)  [place], G. SHAUNNA. and Alpha, C. W. (1990). The development of the Dizziness Handicap Inventory. Archives of Otolaryngology - Head and Neck Surgery 116(4): [id number].   VESTIBULAR ASSESSMENT:  GENERAL OBSERVATION: Pt moves slowly, guarded, especially with bed mobility   SYMPTOM BEHAVIOR:  Subjective history: 2 months ago, pt reports she woke up to dizziness.  She has only experienced this previously just after her cochlear implant was placed (about 9 months ago).  She reports sensation as spinning, lightheaded, imbalanced; her symptoms are mostly lightheadedness and imbalance now, that comes on with head motions L and R and bending over/looking up.  She does have hx of cochlear implant on R (approx 9 months ago), fall and hitting her head (CT negative), antibiotics given approx 2-2.5 months ago due to fluid in her inner ear (pt states dizziness preceded this).   Non-Vestibular symptoms: NA  Type of dizziness:  Spinning/Vertigo, Unsteady with head/body turns, Lightheadedness/Faint, and quick movements lag  Frequency: daily  Duration: lasts until I sit back down  Aggravating factors: Induced by position change: supine to sit, Induced by motion: looking up at the ceiling, bending down to the ground, turning body quickly, and turning head quickly, and worse in the afternoon  Relieving factors: rest and sitting down  Progression of symptoms: unchanged  OCULOMOTOR EXAM:  Ocular Alignment: normal  Ocular ROM: No Limitations  Spontaneous Nystagmus: absent  Gaze-Induced Nystagmus: absent  Smooth Pursuits: intact  Saccades: intact and except some quick, overshoot movements R eye  Convergence/Divergence: 2-3 cm    VESTIBULAR - OCULAR REFLEX:   Slow VOR: Comment: slowed horizontal, rates as 8/10 dizziness; no symptoms vertical  VOR Cancellation: Corrective Saccades to R side, 5/10 dizziness  Head-Impulse Test: HIT Right: positive HIT Left: positive  Dynamic Visual Acuity: NT   POSITIONAL TESTING: Right Dix-Hallpike: no nystagmus and reports dizziness getting into position Left Dix-Hallpike: no nystagmus and reports dizziness coming into position (pt comes into position very slowly, did  not see nystagmus) Right Roll Test: no nystagmus and mild dizziness getting into position Left Roll Test: no nystagmus and mild dizziness getting into position    M-CTSIB eval 10/17/23  Condition 1: Firm Surface, EO 30 Sec, Normal Sway 30 sec, normal  Condition 2: Firm Surface, EC 30 Sec, Mild Sway 30 sec, normal   Condition 3: Foam Surface, EO 30 Sec, Mild Sway 30 sec, normal  Condition 4: Foam Surface, EC 8.29 Sec, Severe Sway 8 sec, 30 sec, moderate sway    [facility] PT Assessment - 10/17/23 0001       Functional Gait  Assessment   Gait Level Surface Walks 20 ft in less than 7 sec but greater than 5.5 sec, uses assistive device, slower speed, mild gait deviations, or deviates 6-10 in outside of the 12 in walkway  width.   10 meter walk in 12.16 sec   Change in Gait Speed Able to change speed, demonstrates mild gait deviations, deviates 6-10 in outside of the 12 in walkway width, or no gait deviations, unable to achieve a major change in velocity, or uses a change in velocity, or uses an assistive device.    Gait with Horizontal Head Turns Performs head turns smoothly with slight change in gait velocity (eg, minor disruption to smooth gait path), deviates 6-10 in outside 12 in walkway width, or uses an assistive device.    Gait with Vertical Head Turns Performs head turns with no change in gait. Deviates no more than 6 in outside 12 in walkway width.    Gait and Pivot Turn Pivot turns safely within 3 sec and stops quickly with no loss of balance.    Step Over Obstacle Is able to step over 2 stacked shoe boxes taped together (9 in total height) without changing gait speed. No evidence of imbalance.    Gait with Narrow Base of Support Ambulates 7-9 steps.    Gait with Eyes Closed Walks 20 ft, uses assistive device, slower speed, mild gait deviations, deviates 6-10 in outside 12 in walkway width. Ambulates 20 ft in less than 9 sec but greater than 7 sec.    Ambulating Backwards Walks 20 ft, uses assistive device, slower speed, mild gait deviations, deviates 6-10 in outside 12 in walkway width.    Steps Two feet to a stair, must use rail.    Total Score 22                                                                                                                                    GOALS: Goals reviewed with patient? Yes  SHORT TERM GOALS: Target date: 10/04/2023  Pt will be independent with HEP for improved dizziness, balance. Baseline: pt reports compliance and benefit from HEP 10/03/23 Goal status: MET 10/03/23  LONG TERM GOALS: Target date: 10/25/2023  Pt will be independent with HEP for improved dizziness, balance. Baseline:  10/17/23: Pt reports compliance to  HEP, updated final HEP Goal status:  MET  2.  DHI score to improve to less than or equal to 28, to demo decreased dizziness impacting daily activities. Baseline: 46 10/17/23: Total DHI Score: 8 / 100 = 8 % (P: 4 / 24 E: 0 / 36 F: 4 / 40) Goal status: MET  3.  Pt will improve Condition 4 on MCTSIB to 30 seconds mod sway or better, to demo improved balance. Baseline: 8 sec 10/17/23: 30 sec mod sway Goal status: MET  4.  Pt will improve FGA score to at least 23/30 to decrease fall risk. Baseline: TBD 10/03/23: 16/30 10/17/23: 22/30 Goal status: PARTIALLY MET  5.  Pt will verbalize understanding of fall prevention in home environment.  Baseline:  10/17/23: Provided hand out for falls checklist at home Goal status: MET    ASSESSMENT:  CLINICAL IMPRESSION: Pt arrived feeling she is back to her normal baseline. She has met or partially met of her LTGs. Greatest difficulty on her FGA is primarily with stairs (however, this appears to be pt's baseline), backwards stepping, and ambulating with eyes closed or head turns. Updated and finalized pt's HEP. Pt is ready for PT d/c.    OBJECTIVE IMPAIRMENTS: Abnormal gait, decreased balance, difficulty walking, and dizziness.   ACTIVITY LIMITATIONS: bending, squatting, bed mobility, reach over head, and locomotion level  PARTICIPATION LIMITATIONS: meal prep, cleaning, laundry, community activity, school, and walking the dog  PERSONAL FACTORS: 3+ comorbidities: see above are also affecting patient's functional outcome.   REHAB POTENTIAL: Good  CLINICAL DECISION MAKING: Stable/uncomplicated  EVALUATION COMPLEXITY: Low   PLAN:  PT FREQUENCY: 2x/week  PT DURATION: other: 5 weeks, including eval week  PLANNED INTERVENTIONS: 97750- Physical Performance Testing, 97110-Therapeutic exercises, 97530- Therapeutic activity, [id number]- Neuromuscular re-education, 97535- Self Care, 02859- Manual therapy, [phone]- Gait training, Patient/Family education, Balance training, and Vestibular  training  PLAN FOR NEXT SESSION: recert vs. DC; Work on 180 turns/stop (harder to R today).  HEP review and progressions for VOR.   May need to retest positional vertigo as symptoms warrant    Samarie Pinder April Ma L Navpreet Szczygiel, [place], DPT 10/17/23 10:58 AM  [facility] at [facility] [address] [place], [address] [place], [place] [address] Phone # [phone] Fax # [phone]

## 2023-10-25 ENCOUNTER — Ambulatory Visit (HOSPITAL_COMMUNITY)
Admission: RE | Admit: 2023-10-25 | Discharge: 2023-10-25 | Disposition: A | Discharge status: Home / Self Care | Source: Ambulatory Visit | Attending: Hematology & Oncology | Admitting: Hematology & Oncology

## 2023-10-25 DIAGNOSIS — K766 Portal hypertension: Secondary | ICD-10-CM | POA: Diagnosis not present

## 2023-10-25 DIAGNOSIS — K769 Liver disease, unspecified: Secondary | ICD-10-CM

## 2023-10-25 DIAGNOSIS — K7460 Unspecified cirrhosis of liver: Secondary | ICD-10-CM | POA: Diagnosis not present

## 2023-10-25 LAB — GLUCOSE, CAPILLARY: Glucose-Capillary: 150 mg/dL — ABNORMAL HIGH (ref 70–99)

## 2023-10-25 MED ORDER — FLUDEOXYGLUCOSE F - 18 (FDG) INJECTION
9.8000 | Freq: Once | INTRAVENOUS | Status: AC | PRN
Start: 2023-10-25 — End: 2023-10-25
  Administered 2023-10-25: 9.8 via INTRAVENOUS

## 2023-10-28 ENCOUNTER — Ambulatory Visit: Payer: Self-pay | Admitting: Hematology & Oncology

## 2023-11-01 ENCOUNTER — Ambulatory Visit (INDEPENDENT_AMBULATORY_CARE_PROVIDER_SITE_OTHER): Admitting: Otolaryngology

## 2023-11-01 VITALS — BP 159/99 | HR 82 | Temp 98.2°F | Ht 61.0 in | Wt 190.0 lb

## 2023-11-01 DIAGNOSIS — H903 Sensorineural hearing loss, bilateral: Secondary | ICD-10-CM

## 2023-11-01 DIAGNOSIS — H9042 Sensorineural hearing loss, unilateral, left ear, with unrestricted hearing on the contralateral side: Secondary | ICD-10-CM | POA: Diagnosis not present

## 2023-11-01 DIAGNOSIS — H60332 Swimmer's ear, left ear: Secondary | ICD-10-CM

## 2023-11-03 NOTE — Progress Notes (Signed)
 Patient ID: Stephanie Cordova, female   DOB: 1955/11/07, 68 y.o.   MRN: [id number]  Follow-up: Left acute otitis externa, hearing loss  HPI: The patient is a 68 year old female who returns today for follow-up evaluation.  She was last seen in September 2025.  At that time, she was noted to have an acute left otitis externa.  She was treated with debridement and Ciprodex  eardrops.  The patient also has a history of profound right ear hearing loss and severe left ear sensorineural hearing loss.  She underwent cochlear implantation on the right side in December 2024.  The surgery was performed at [facility].  The patient returns today reporting significant improvement in her left ear discomfort.  She denies any otalgia, otorrhea, or recent change in her hearing.  Exam: General: Communicates without difficulty, well nourished, no acute distress. Head: Normocephalic, no evidence injury, no tenderness, facial buttresses intact without stepoff. Face/sinus: No tenderness to palpation and percussion. Facial movement is normal and symmetric. Eyes: PERRL, EOMI. No scleral icterus, conjunctivae clear. Neuro: CN II exam reveals vision grossly intact.  No nystagmus at any point of gaze. Ears: Auricles well formed without lesions.  Ear canals are intact without mass or lesion.  No erythema or edema is appreciated.  The TMs are intact without fluid. Nose: External evaluation reveals normal support and skin without lesions.  Dorsum is intact.  Anterior rhinoscopy reveals normal mucosa over anterior aspect of inferior turbinates and intact septum.  No purulence noted. Oral:  Oral cavity and oropharynx are intact, symmetric, without erythema or edema.  Mucosa is moist without lesions. Neck: Full range of motion without pain.  There is no significant lymphadenopathy.  No masses palpable.  Thyroid  bed within normal limits to palpation.  Parotid glands and submandibular glands equal bilaterally without mass.  Trachea is midline.  Neuro:  CN 2-12 grossly intact.   Assessment: 1.  The patient's acute left otitis externa has resolved.  Both tympanic membranes and middle ear spaces are noted to be normal. 2.  Right ear profound hearing loss and left ear severe sensorineural hearing loss.  Plan: 1.  The physical exam findings are reviewed with the patient. 2.  The patient is reassured that her acute infection has resolved. 3.  Continue the use of her cochlear implant. 4.  The patient is encouraged to call with any questions or concerns.

## 2023-11-05 ENCOUNTER — Inpatient Hospital Stay: Attending: Hematology & Oncology

## 2023-11-05 ENCOUNTER — Other Ambulatory Visit: Payer: Self-pay

## 2023-11-05 ENCOUNTER — Inpatient Hospital Stay (HOSPITAL_BASED_OUTPATIENT_CLINIC_OR_DEPARTMENT_OTHER): Admitting: Hematology & Oncology

## 2023-11-05 VITALS — BP 163/83 | HR 75 | Temp 98.2°F | Resp 16 | Ht 61.0 in | Wt 194.0 lb

## 2023-11-05 DIAGNOSIS — K7581 Nonalcoholic steatohepatitis (NASH): Secondary | ICD-10-CM | POA: Diagnosis not present

## 2023-11-05 DIAGNOSIS — K769 Liver disease, unspecified: Secondary | ICD-10-CM

## 2023-11-05 DIAGNOSIS — R7989 Other specified abnormal findings of blood chemistry: Secondary | ICD-10-CM

## 2023-11-05 DIAGNOSIS — D696 Thrombocytopenia, unspecified: Secondary | ICD-10-CM | POA: Insufficient documentation

## 2023-11-05 DIAGNOSIS — Z79899 Other long term (current) drug therapy: Secondary | ICD-10-CM | POA: Insufficient documentation

## 2023-11-05 DIAGNOSIS — K7460 Unspecified cirrhosis of liver: Secondary | ICD-10-CM | POA: Diagnosis not present

## 2023-11-05 LAB — CMP (CANCER CENTER ONLY)
ALT: 21 U/L (ref 0–44)
AST: 35 U/L (ref 15–41)
Albumin: 4 g/dL (ref 3.5–5.0)
Alkaline Phosphatase: 68 U/L (ref 38–126)
Anion gap: 10 (ref 5–15)
BUN: 12 mg/dL (ref 8–23)
CO2: 24 mmol/L (ref 22–32)
Calcium: 9.2 mg/dL (ref 8.9–10.3)
Chloride: 105 mmol/L (ref 98–111)
Creatinine: 0.87 mg/dL (ref 0.44–1.00)
GFR, Estimated: >60 mL/min (ref >60)
Glucose, Bld: 152 mg/dL — ABNORMAL HIGH (ref 70–99)
Potassium: 4.2 mmol/L (ref 3.5–5.1)
Sodium: 139 mmol/L (ref 135–145)
Total Bilirubin: 0.9 mg/dL (ref 0.0–1.2)
Total Protein: 6.1 g/dL — ABNORMAL LOW (ref 6.5–8.1)

## 2023-11-05 LAB — CBC WITH DIFFERENTIAL (CANCER CENTER ONLY)
Abs Immature Granulocytes: 0.01 K/uL (ref 0.00–0.07)
Basophils Absolute: 0 K/uL (ref 0.0–0.1)
Basophils Relative: 1 %
Eosinophils Absolute: 0.1 K/uL (ref 0.0–0.5)
Eosinophils Relative: 2 %
HCT: 35.6 % — ABNORMAL LOW (ref 36.0–46.0)
Hemoglobin: 12.1 g/dL (ref 12.0–15.0)
Immature Granulocytes: 0 %
Lymphocytes Relative: 15 %
Lymphs Abs: 0.8 K/uL (ref 0.7–4.0)
MCH: 30 pg (ref 26.0–34.0)
MCHC: 34 g/dL (ref 30.0–36.0)
MCV: 88.1 fL (ref 80.0–100.0)
Monocytes Absolute: 0.3 K/uL (ref 0.1–1.0)
Monocytes Relative: 6 %
Neutro Abs: 3.8 K/uL (ref 1.7–7.7)
Neutrophils Relative %: 76 %
Platelet Count: 69 K/uL — ABNORMAL LOW (ref 150–400)
RBC: 4.04 MIL/uL (ref 3.87–5.11)
RDW: 13.9 % (ref 11.5–15.5)
WBC Count: 5 K/uL (ref 4.0–10.5)
nRBC: 0 % (ref 0.0–0.2)

## 2023-11-05 LAB — LACTATE DEHYDROGENASE: LDH: 189 U/L (ref 98–192)

## 2023-11-05 NOTE — Progress Notes (Signed)
 Hematology and Oncology Follow Up Visit  Stephanie Cordova [id number] [birth year] 68 y.o. 11/05/2023   Principle Diagnosis:  Thrombocytopenia-likely secondary to hepatic cirrhosis-NASH  Current Therapy:   Observation     Interim History:  Stephanie Cordova is back for follow-up.  Thankfully, I do not think that we have any problems with malignancy.  We did do a PET scan on her.  This was done on 10/25/2023.  The PET scan not show any evidence of malignant activity.  She has had no problems with cough or shortness of breath.  There is been no nausea or vomiting.  She has had no obvious change in bowel or bladder habits.  She has had no leg swelling.  She has had no rashes.  She really would like to have a new Gastroenterologist.  I told her that her family doctor should be the one to be able to make that referral to Gastroenterology.  She would like to have a Solicitor in the [place] system.  She has had no bleeding.  She has had no bruising.  Overall, I would have said that her performance status is probably ECOG 1.    Medications:  Current Outpatient Medications:    Blood Glucose Monitoring Suppl (ONE TOUCH ULTRA SYSTEM KIT) w/Device KIT, Use as directed twice daily to check blood sugar.  DX E11.9, Disp: 1 each, Rfl: 0   carvedilol (COREG) 12.5 MG tablet, Take 12.5 mg by mouth daily., Disp: , Rfl:    fluticasone  (FLONASE ) 50 MCG/ACT nasal spray, Place 2 sprays into both nostrils daily., Disp: , Rfl:    furosemide  (LASIX ) 20 MG tablet, Take 20 mg by mouth daily. (Patient taking differently: Take 20 mg by mouth once a week.), Disp: , Rfl:    LORazepam (ATIVAN) 0.5 MG tablet, TAKE 1 TABLET AT NIGHTTIME AS NEEDED FOR SLEEP, Disp: , Rfl:    meclizine (ANTIVERT) 12.5 MG tablet, Take 12.5 mg by mouth 3 (three) times daily as needed for dizziness., Disp: , Rfl:    MOUNJARO 5 MG/0.5ML Pen, Inject 5 mg into the skin once a week. (Patient not taking: Reported on 11/01/2023), Disp: , Rfl:    nitroGLYCERIN   (NITROSTAT ) 0.4 MG SL tablet, Place 1 tablet (0.4 mg total) under the tongue every 5 (five) minutes as needed for chest pain. (Patient not taking: Reported on 11/01/2023), Disp: 25 tablet, Rfl: 3   ONE TOUCH ULTRA TEST test strip, USE AS DIRECTED TWICE A DAY, Disp: 100 each, Rfl: 2   Vitamin D , Ergocalciferol , (DRISDOL ) 1.25 MG (50000 UT) CAPS capsule, TAKE 1 CAPSULE BY MOUTH ONCE A WEEK, Disp: 12 capsule, Rfl: 0  Allergies:  Allergies  Allergen Reactions   Losartan  Palpitations   Lexapro  [Escitalopram ] Other (See Comments)    swelling   Sulfa Antibiotics Hives and Rash    Burning rash    Past Medical History, Surgical history, Social history, and Family History were reviewed and updated.  Review of Systems: Review of Systems  Constitutional: Negative.   HENT:  Negative.    Eyes: Negative.   Respiratory: Negative.    Cardiovascular: Negative.   Gastrointestinal: Negative.   Endocrine: Negative.   Genitourinary: Negative.    Musculoskeletal: Negative.   Skin: Negative.   Neurological: Negative.   Hematological: Negative.   Psychiatric/Behavioral: Negative.      Physical Exam:  height is 5' 1 (1.549 m) and weight is 194 lb (88 kg). Her oral temperature is 98.2 F (36.8 C). Her blood pressure is 163/83 (abnormal) and  her pulse is 75. Her respiration is 16 and oxygen saturation is 100%.   Wt Readings from Last 3 Encounters:  11/05/23 194 lb (88 kg)  11/01/23 190 lb (86.2 kg)  10/08/23 192 lb (87.1 kg)    Physical Exam Vitals reviewed.  HENT:     Head: Normocephalic and atraumatic.  Eyes:     Pupils: Pupils are equal, round, and reactive to light.  Cardiovascular:     Rate and Rhythm: Normal rate and regular rhythm.     Heart sounds: Normal heart sounds.  Pulmonary:     Effort: Pulmonary effort is normal.     Breath sounds: Normal breath sounds.  Abdominal:     General: Bowel sounds are normal.     Palpations: Abdomen is soft.  Musculoskeletal:        General:  No tenderness or deformity. Normal range of motion.     Cervical back: Normal range of motion.  Lymphadenopathy:     Cervical: No cervical adenopathy.  Skin:    General: Skin is warm and dry.     Findings: No erythema or rash.  Neurological:     Mental Status: She is alert and oriented to person, place, and time.  Psychiatric:        Behavior: Behavior normal.        Thought Content: Thought content normal.        Judgment: Judgment normal.      Lab Results  Component Value Date   WBC 5.0 11/05/2023   HGB 12.1 11/05/2023   HCT 35.6 (L) 11/05/2023   MCV 88.1 11/05/2023   PLT 69 (L) 11/05/2023     Chemistry      Component Value Date/Time   NA 137 10/08/2023 1149   NA 141 12/25/2016 1407   K 4.6 10/08/2023 1149   CL 101 10/08/2023 1149   CO2 26 10/08/2023 1149   BUN 12 10/08/2023 1149   BUN 12 12/25/2016 1407   CREATININE 0.78 10/08/2023 1149   CREATININE 0.70 12/03/2012 0917      Component Value Date/Time   CALCIUM  9.0 10/08/2023 1149   ALKPHOS 119 10/08/2023 1149   AST 33 10/08/2023 1149   ALT 21 10/08/2023 1149   BILITOT 0.9 10/08/2023 1149      Impression and Plan: Stephanie Cordova is a very nice 79 year old white female.  She has NASH.  She has some cirrhosis.  Again, the PET scan did not show any evidence of a mass in the liver.  For right now, clearly, her cirrhosis is her biggest issue.  Her platelet count is down a little bit more today.  I told her that it still not low enough that would cause her any problems.  From my point of view, I think we can now get her through the Holiday season.  I will plan to see her back next year and we will see how her platelet count looks.    Stephanie JONELLE Crease, MD 10/7/202512:38 PM

## 2023-11-06 LAB — AFP TUMOR MARKER: AFP, Serum, Tumor Marker: 2 ng/mL (ref 0.0–9.2)

## 2023-11-28 ENCOUNTER — Telehealth: Payer: Self-pay | Admitting: Pediatrics

## 2023-11-28 NOTE — Telephone Encounter (Signed)
 Good Morning Dr Suzann   Patient preferred provider   We received a call from patient to be seen for [facility] care due to relocation.   Patient previously;ly followed [facility]. Please review available records and advise on scheduling.   Thank you

## 2023-11-29 NOTE — Telephone Encounter (Signed)
 Patient advised on previous recommendations.

## 2024-02-05 ENCOUNTER — Inpatient Hospital Stay (HOSPITAL_BASED_OUTPATIENT_CLINIC_OR_DEPARTMENT_OTHER): Admitting: Hematology & Oncology

## 2024-02-05 ENCOUNTER — Inpatient Hospital Stay: Attending: Hematology & Oncology

## 2024-02-05 ENCOUNTER — Encounter: Payer: Self-pay | Admitting: Hematology & Oncology

## 2024-02-05 VITALS — BP 171/72 | HR 84 | Temp 97.2°F | Resp 20 | Ht 61.0 in | Wt 191.4 lb

## 2024-02-05 DIAGNOSIS — K7460 Unspecified cirrhosis of liver: Secondary | ICD-10-CM

## 2024-02-05 DIAGNOSIS — Z79899 Other long term (current) drug therapy: Secondary | ICD-10-CM | POA: Insufficient documentation

## 2024-02-05 DIAGNOSIS — R7989 Other specified abnormal findings of blood chemistry: Secondary | ICD-10-CM

## 2024-02-05 DIAGNOSIS — D696 Thrombocytopenia, unspecified: Secondary | ICD-10-CM | POA: Insufficient documentation

## 2024-02-05 DIAGNOSIS — E119 Type 2 diabetes mellitus without complications: Secondary | ICD-10-CM | POA: Insufficient documentation

## 2024-02-05 LAB — CBC WITH DIFFERENTIAL (CANCER CENTER ONLY)
Abs Immature Granulocytes: 0.02 K/uL (ref 0.00–0.07)
Basophils Absolute: 0.1 K/uL (ref 0.0–0.1)
Basophils Relative: 1 %
Eosinophils Absolute: 0.2 K/uL (ref 0.0–0.5)
Eosinophils Relative: 3 %
HCT: 37.1 % (ref 36.0–46.0)
Hemoglobin: 12.7 g/dL (ref 12.0–15.0)
Immature Granulocytes: 0 %
Lymphocytes Relative: 16 %
Lymphs Abs: 0.9 K/uL (ref 0.7–4.0)
MCH: 29.5 pg (ref 26.0–34.0)
MCHC: 34.2 g/dL (ref 30.0–36.0)
MCV: 86.3 fL (ref 80.0–100.0)
Monocytes Absolute: 0.4 K/uL (ref 0.1–1.0)
Monocytes Relative: 7 %
Neutro Abs: 3.9 K/uL (ref 1.7–7.7)
Neutrophils Relative %: 73 %
Platelet Count: 86 K/uL — ABNORMAL LOW (ref 150–400)
RBC: 4.3 MIL/uL (ref 3.87–5.11)
RDW: 14.3 % (ref 11.5–15.5)
WBC Count: 5.4 K/uL (ref 4.0–10.5)
nRBC: 0 % (ref 0.0–0.2)

## 2024-02-05 LAB — SAVE SMEAR(SSMR), FOR PROVIDER SLIDE REVIEW

## 2024-02-05 LAB — CMP (CANCER CENTER ONLY)
ALT: 17 U/L (ref 0–44)
AST: 30 U/L (ref 15–41)
Albumin: 4.3 g/dL (ref 3.5–5.0)
Alkaline Phosphatase: 61 U/L (ref 38–126)
Anion gap: 11 (ref 5–15)
BUN: 13 mg/dL (ref 8–23)
CO2: 23 mmol/L (ref 22–32)
Calcium: 9.2 mg/dL (ref 8.9–10.3)
Chloride: 107 mmol/L (ref 98–111)
Creatinine: 0.77 mg/dL (ref 0.44–1.00)
GFR, Estimated: >60 mL/min
Glucose, Bld: 153 mg/dL — ABNORMAL HIGH (ref 70–99)
Potassium: 4.1 mmol/L (ref 3.5–5.1)
Sodium: 140 mmol/L (ref 135–145)
Total Bilirubin: 1.2 mg/dL (ref 0.0–1.2)
Total Protein: 6.3 g/dL — ABNORMAL LOW (ref 6.5–8.1)

## 2024-02-05 LAB — CEA (ACCESS): CEA (CHCC): 4.02 ng/mL (ref 0.00–5.00)

## 2024-02-05 NOTE — Progress Notes (Signed)
 BP remains elevated, 171/72, instructed to monitor at home and notify PCP if it remains over 140/90. Verbalized understanding.

## 2024-02-05 NOTE — Progress Notes (Signed)
 " Hematology and Oncology Follow Up Visit  Stephanie Cordova [id number] 04-Nov-1955 69 y.o. 02/05/2024   Principle Diagnosis:  Thrombocytopenia-likely secondary to hepatic cirrhosis-NASH  Current Therapy:   Observation     Interim History:  Stephanie Cordova is back for follow-up.  We last saw her back in September.  She is doing pretty well.  She really has had no problems over the Holiday season.  She is trying to watch her blood sugars.  Apparently, she cannot find a Solicitor.  She tried to get 1 in [place] but was told they would not see her because of her cirrhosis.  Again, I am shocked that they would say this given that cirrhosis is something that they deal with all the time.  I will see if I cannot find a Solicitor for her.  When we last saw her, her alpha-fetoprotein was only 2.3.  We will check this on occasion.  She has had no bleeding.  There has been no bruising or rashes.  She has had no change in bowel or bladder habits.  Overall, her diabetes clearly is her biggest issue.  At the present time, I would say that her performance status is probably ECOG 1.    Medications:  Current Outpatient Medications:    Blood Glucose Monitoring Suppl (ONE TOUCH ULTRA SYSTEM KIT) w/Device KIT, Use as directed twice daily to check blood sugar.  DX E11.9, Disp: 1 each, Rfl: 0   carvedilol (COREG) 12.5 MG tablet, Take 12.5 mg by mouth daily., Disp: , Rfl:    fluticasone  (FLONASE ) 50 MCG/ACT nasal spray, Place 2 sprays into both nostrils daily., Disp: , Rfl:    furosemide  (LASIX ) 20 MG tablet, Take 20 mg by mouth daily. (Patient taking differently: Take 20 mg by mouth once a week.), Disp: , Rfl:    LORazepam (ATIVAN) 0.5 MG tablet, TAKE 1 TABLET AT NIGHTTIME AS NEEDED FOR SLEEP, Disp: , Rfl:    meclizine (ANTIVERT) 12.5 MG tablet, Take 12.5 mg by mouth 3 (three) times daily as needed for dizziness., Disp: , Rfl:    MOUNJARO 5 MG/0.5ML Pen, Inject 5 mg into the skin once a week.,  Disp: , Rfl:    naproxen sodium (ALEVE) 220 MG tablet, Take 220 mg by mouth daily as needed., Disp: , Rfl:    nitroGLYCERIN  (NITROSTAT ) 0.4 MG SL tablet, Place 1 tablet (0.4 mg total) under the tongue every 5 (five) minutes as needed for chest pain., Disp: 25 tablet, Rfl: 3   ONE TOUCH ULTRA TEST test strip, USE AS DIRECTED TWICE A DAY, Disp: 100 each, Rfl: 2   Vitamin D , Ergocalciferol , (DRISDOL ) 1.25 MG (50000 UT) CAPS capsule, TAKE 1 CAPSULE BY MOUTH ONCE A WEEK, Disp: 12 capsule, Rfl: 0  Allergies:  Allergies  Allergen Reactions   Losartan  Palpitations   Lexapro  [Escitalopram ] Other (See Comments)    swelling   Sulfa Antibiotics Hives and Rash    Burning rash    Past Medical History, Surgical history, Social history, and Family History were reviewed and updated.  Review of Systems: Review of Systems  Constitutional: Negative.   HENT:  Negative.    Eyes: Negative.   Respiratory: Negative.    Cardiovascular: Negative.   Gastrointestinal: Negative.   Endocrine: Negative.   Genitourinary: Negative.    Musculoskeletal: Negative.   Skin: Negative.   Neurological: Negative.   Hematological: Negative.   Psychiatric/Behavioral: Negative.      Physical Exam:  height is 5' 1 (1.549 m) and weight is  191 lb 6.4 oz (86.8 kg). Her oral temperature is 97.2 F (36.2 C) (abnormal). Her blood pressure is 171/72 (abnormal) and her pulse is 84. Her respiration is 20 and oxygen saturation is 100%.   Wt Readings from Last 3 Encounters:  02/05/24 191 lb 6.4 oz (86.8 kg)  11/05/23 194 lb (88 kg)  11/01/23 190 lb (86.2 kg)    Physical Exam Vitals reviewed.  HENT:     Head: Normocephalic and atraumatic.  Eyes:     Pupils: Pupils are equal, round, and reactive to light.  Cardiovascular:     Rate and Rhythm: Normal rate and regular rhythm.     Heart sounds: Normal heart sounds.  Pulmonary:     Effort: Pulmonary effort is normal.     Breath sounds: Normal breath sounds.  Abdominal:      General: Bowel sounds are normal.     Palpations: Abdomen is soft.  Musculoskeletal:        General: No tenderness or deformity. Normal range of motion.     Cervical back: Normal range of motion.  Lymphadenopathy:     Cervical: No cervical adenopathy.  Skin:    General: Skin is warm and dry.     Findings: No erythema or rash.  Neurological:     Mental Status: She is alert and oriented to person, place, and time.  Psychiatric:        Behavior: Behavior normal.        Thought Content: Thought content normal.        Judgment: Judgment normal.      Lab Results  Component Value Date   WBC 5.4 02/05/2024   HGB 12.7 02/05/2024   HCT 37.1 02/05/2024   MCV 86.3 02/05/2024   PLT 86 (L) 02/05/2024     Chemistry      Component Value Date/Time   NA 140 02/05/2024 1005   NA 141 12/25/2016 1407   K 4.1 02/05/2024 1005   CL 107 02/05/2024 1005   CO2 23 02/05/2024 1005   BUN 13 02/05/2024 1005   BUN 12 12/25/2016 1407   CREATININE 0.77 02/05/2024 1005   CREATININE 0.70 12/03/2012 0917      Component Value Date/Time   CALCIUM  9.2 02/05/2024 1005   ALKPHOS 61 02/05/2024 1005   AST 30 02/05/2024 1005   ALT 17 02/05/2024 1005   BILITOT 1.2 02/05/2024 1005      Impression and Plan: Stephanie Cordova is a very nice 45 year old white female.  She has NASH.  She has some cirrhosis.  Again, the PET scan did not show any evidence of a mass in the liver.  For right now, clearly, her cirrhosis is her biggest issue.  Her platelet count is better today.  I am happy about this.  We will try to get her through the Winter.  I will have her come back in 3 months.   Stephanie JONELLE Crease, MD 1/7/202611:04 AM "

## 2024-02-06 LAB — CANCER ANTIGEN 19-9: CA 19-9: 40 U/mL — ABNORMAL HIGH (ref 0–35)

## 2024-05-05 ENCOUNTER — Inpatient Hospital Stay: Admitting: Family

## 2024-05-05 ENCOUNTER — Inpatient Hospital Stay
